# Patient Record
Sex: Female | Born: 1975
Health system: Southern US, Community
[De-identification: ages and names within clinical notes are randomized; demographics above are authoritative.]

## PROBLEM LIST (undated history)

## (undated) DIAGNOSIS — I10 Essential (primary) hypertension: Secondary | ICD-10-CM

## (undated) DIAGNOSIS — R112 Nausea with vomiting, unspecified: Secondary | ICD-10-CM

## (undated) DIAGNOSIS — L0390 Cellulitis, unspecified: Secondary | ICD-10-CM

## (undated) DIAGNOSIS — F32A Depression, unspecified: Secondary | ICD-10-CM

## (undated) DIAGNOSIS — N63 Unspecified lump in unspecified breast: Secondary | ICD-10-CM

## (undated) DIAGNOSIS — Z9889 Other specified postprocedural states: Secondary | ICD-10-CM

## (undated) DIAGNOSIS — B9562 Methicillin resistant Staphylococcus aureus infection as the cause of diseases classified elsewhere: Secondary | ICD-10-CM

## (undated) DIAGNOSIS — F419 Anxiety disorder, unspecified: Secondary | ICD-10-CM

## (undated) DIAGNOSIS — F329 Major depressive disorder, single episode, unspecified: Secondary | ICD-10-CM

## (undated) DIAGNOSIS — M1990 Unspecified osteoarthritis, unspecified site: Secondary | ICD-10-CM

## (undated) HISTORY — PX: TUBAL LIGATION: SHX77

## (undated) HISTORY — DX: Methicillin resistant Staphylococcus aureus infection as the cause of diseases classified elsewhere: B95.62

## (undated) HISTORY — PX: CHOLECYSTECTOMY: SHX55

## (undated) HISTORY — DX: Anxiety disorder, unspecified: F41.9

## (undated) HISTORY — DX: Unspecified lump in unspecified breast: N63.0

## (undated) HISTORY — DX: Essential (primary) hypertension: I10

## (undated) HISTORY — DX: Cellulitis, unspecified: L03.90

## (undated) HISTORY — PX: TONSILLECTOMY: SUR1361

## (undated) HISTORY — DX: Depression, unspecified: F32.A

## (undated) HISTORY — DX: Major depressive disorder, single episode, unspecified: F32.9

---

## 1998-11-20 HISTORY — PX: DILATION AND CURETTAGE OF UTERUS: SHX78

## 1998-11-26 ENCOUNTER — Ambulatory Visit (HOSPITAL_COMMUNITY): Admission: AD | Admit: 1998-11-26 | Discharge: 1998-11-26 | Payer: Self-pay | Admitting: Obstetrics and Gynecology

## 1998-11-26 ENCOUNTER — Inpatient Hospital Stay (HOSPITAL_COMMUNITY): Admission: AD | Admit: 1998-11-26 | Discharge: 1998-11-26 | Payer: Self-pay | Admitting: Obstetrics and Gynecology

## 1998-11-26 ENCOUNTER — Encounter (INDEPENDENT_AMBULATORY_CARE_PROVIDER_SITE_OTHER): Payer: Self-pay | Admitting: *Deleted

## 2000-01-19 ENCOUNTER — Other Ambulatory Visit: Admission: RE | Admit: 2000-01-19 | Discharge: 2000-01-19 | Payer: Self-pay | Admitting: Obstetrics and Gynecology

## 2000-10-10 ENCOUNTER — Inpatient Hospital Stay (HOSPITAL_COMMUNITY): Admission: AD | Admit: 2000-10-10 | Discharge: 2000-10-10 | Payer: Self-pay | Admitting: Obstetrics and Gynecology

## 2000-10-23 ENCOUNTER — Inpatient Hospital Stay (HOSPITAL_COMMUNITY): Admission: AD | Admit: 2000-10-23 | Discharge: 2000-10-23 | Payer: Self-pay | Admitting: Obstetrics and Gynecology

## 2000-10-24 ENCOUNTER — Inpatient Hospital Stay (HOSPITAL_COMMUNITY): Admission: AD | Admit: 2000-10-24 | Discharge: 2000-10-27 | Payer: Self-pay | Admitting: Obstetrics and Gynecology

## 2000-10-24 ENCOUNTER — Encounter (INDEPENDENT_AMBULATORY_CARE_PROVIDER_SITE_OTHER): Payer: Self-pay | Admitting: Specialist

## 2000-12-03 ENCOUNTER — Other Ambulatory Visit: Admission: RE | Admit: 2000-12-03 | Discharge: 2000-12-03 | Payer: Self-pay | Admitting: Obstetrics and Gynecology

## 2002-12-11 ENCOUNTER — Inpatient Hospital Stay (HOSPITAL_COMMUNITY): Admission: EM | Admit: 2002-12-11 | Discharge: 2002-12-18 | Payer: Self-pay | Admitting: General Surgery

## 2003-01-10 ENCOUNTER — Inpatient Hospital Stay (HOSPITAL_COMMUNITY): Admission: EM | Admit: 2003-01-10 | Discharge: 2003-01-18 | Payer: Self-pay | Admitting: Emergency Medicine

## 2003-05-28 ENCOUNTER — Emergency Department (HOSPITAL_COMMUNITY): Admission: EM | Admit: 2003-05-28 | Discharge: 2003-05-28 | Payer: Self-pay | Admitting: Emergency Medicine

## 2003-06-02 ENCOUNTER — Inpatient Hospital Stay (HOSPITAL_COMMUNITY): Admission: AD | Admit: 2003-06-02 | Discharge: 2003-06-06 | Payer: Self-pay | Admitting: General Surgery

## 2003-06-20 DIAGNOSIS — B9562 Methicillin resistant Staphylococcus aureus infection as the cause of diseases classified elsewhere: Secondary | ICD-10-CM

## 2003-06-20 HISTORY — DX: Methicillin resistant Staphylococcus aureus infection as the cause of diseases classified elsewhere: B95.62

## 2003-06-26 ENCOUNTER — Inpatient Hospital Stay (HOSPITAL_COMMUNITY): Admission: EM | Admit: 2003-06-26 | Discharge: 2003-07-04 | Payer: Self-pay | Admitting: Emergency Medicine

## 2003-08-09 ENCOUNTER — Inpatient Hospital Stay (HOSPITAL_COMMUNITY): Admission: AD | Admit: 2003-08-09 | Discharge: 2003-08-13 | Payer: Self-pay | Admitting: Family Medicine

## 2003-09-13 ENCOUNTER — Encounter: Admission: RE | Admit: 2003-09-13 | Discharge: 2003-09-13 | Payer: Self-pay | Admitting: Internal Medicine

## 2004-01-27 ENCOUNTER — Emergency Department (HOSPITAL_COMMUNITY): Admission: EM | Admit: 2004-01-27 | Discharge: 2004-01-27 | Payer: Self-pay | Admitting: Emergency Medicine

## 2005-04-05 ENCOUNTER — Ambulatory Visit: Payer: Self-pay | Admitting: Orthopedic Surgery

## 2005-12-18 ENCOUNTER — Encounter: Payer: Self-pay | Admitting: Orthopedic Surgery

## 2008-06-19 HISTORY — PX: ABLATION: SHX5711

## 2008-07-02 ENCOUNTER — Ambulatory Visit (HOSPITAL_COMMUNITY): Admission: RE | Admit: 2008-07-02 | Discharge: 2008-07-02 | Payer: Self-pay | Admitting: Obstetrics and Gynecology

## 2008-07-09 ENCOUNTER — Ambulatory Visit (HOSPITAL_COMMUNITY): Admission: RE | Admit: 2008-07-09 | Discharge: 2008-07-09 | Payer: Self-pay | Admitting: Obstetrics and Gynecology

## 2008-07-21 ENCOUNTER — Ambulatory Visit: Payer: Self-pay | Admitting: Orthopedic Surgery

## 2008-07-21 DIAGNOSIS — S86819A Strain of other muscle(s) and tendon(s) at lower leg level, unspecified leg, initial encounter: Secondary | ICD-10-CM

## 2008-07-21 DIAGNOSIS — S838X9A Sprain of other specified parts of unspecified knee, initial encounter: Secondary | ICD-10-CM | POA: Insufficient documentation

## 2008-07-22 ENCOUNTER — Encounter: Payer: Self-pay | Admitting: Orthopedic Surgery

## 2010-05-30 LAB — CBC
HCT: 38.4 % (ref 36.0–46.0)
Hemoglobin: 14.1 g/dL (ref 12.0–15.0)
MCHC: 36.6 g/dL — ABNORMAL HIGH (ref 30.0–36.0)
MCV: 91.7 fL (ref 78.0–100.0)
Platelets: 228 10*3/uL (ref 150–400)
RBC: 4.19 MIL/uL (ref 3.87–5.11)
RDW: 13 % (ref 11.5–15.5)
WBC: 7.9 10*3/uL (ref 4.0–10.5)

## 2010-05-30 LAB — HCG, QUANTITATIVE, PREGNANCY: hCG, Beta Chain, Quant, S: <2 m[IU]/mL (ref <5)

## 2010-07-04 NOTE — H&P (Signed)
NAME:  Carol Sharp, Carol Sharp               ACCOUNT NO.:  [id number]   MEDICAL RECORD NO.:  [id number]          PATIENT TYPE:  AMB   LOCATION:  DAY                           FACILITY:  [facility]   PHYSICIAN:  Tilda Burrow, M.D. DATE OF BIRTH:  12-26-1975   DATE OF ADMISSION:  DATE OF DISCHARGE:  LH                              HISTORY & PHYSICAL   ADMITTING DIAGNOSIS:  Menorrhagia.   HISTORY OF PRESENT ILLNESS:  This 35 year old female status post tubal  ligation in 2002, LMP Jul 06, 2008, was admitted at this time for  hysteroscopy, D&C, endometrial ablation.  Zera has been followed  through our office intermittently over the past few years.  She presents  with complaints of unacceptably heavy periods with 7 days of flow most  months, using 32 overnight pads per cycle with this flow interfering  with ability to work normally.  However, she has had accidents at work,  saturating clothing and office chair.  She has had an ultrasound which  shows the uterus is grossly normal, heterogenous in texture to the  myometrium with uterine measurements 8.3 x 5.0 x 5.7 cm with a normal  secretory endometrium at the time of the ultrasound.  This was Jul 02, 2008.  She subsequently began her normal menses.  Ovaries were grossly  normal and no suspected abnormalities.  Preoperative endometrial  biopsies not performed due to her young age and normalcy of ultrasound.  GC and Chlamydia cultures are negative.  Most recent Pap smear was class  I and that was greater than one year ago.   PAST MEDICAL HISTORY:  Benign.   MEDICATIONS:  None.   HABITS:  Cigarette one-half pack a day.  Alcohol, denied.  Recreational  drugs, denied.   HOSPITALIZATIONS:  None, other than surgical hospitalizations for  laparoscopic cholecystectomy, outpatient incision and drainage of the  leg boil, and tubal ligation in 2002.  She has had vaginal deliveries  x2, most recently delivered in 2001.   PHYSICAL EXAMINATION:   GENERAL:  A healthy Caucasian female, alert,  oriented x3.  HEENT:  Pupils equal, round, and reactive.  NECK:  Supple.  CARDIOVASCULAR:  Unremarkable.  ABDOMEN:  Moderate obesity without masses.  VITAL SIGNS:  Weight 239.6 kg, blood pressure 120/84.  GU:  External genitalia normal female.  Vaginal exam nonpurulent  menstrual flow.  Cervix multiparous; uterus antiflexed, nontender;  adnexa negative.   PLAN:  Hysteroscopy, D&C, endometrial ablation on Jul 08, 2008 at 7:30  a.m.      Tilda Burrow, M.D.  Electronically Signed     JVF/MEDQ  D:  07/07/2008  T:  07/08/2008  Job:  [id number]   cc:   [facility]

## 2010-07-04 NOTE — Op Note (Signed)
Carol Sharp, Carol Sharp               ACCOUNT NO.:  [id number]   MEDICAL RECORD NO.:  [id number]          PATIENT TYPE:  AMB   LOCATION:  DAY                           FACILITY:  [facility]   PHYSICIAN:  Tilda Burrow, M.D. DATE OF BIRTH:  11-06-1975   DATE OF PROCEDURE:  07/09/2008  DATE OF DISCHARGE:                               OPERATIVE REPORT   PREOPERATIVE DIAGNOSIS:  Menorrhagia.   POSTOPERATIVE DIAGNOSIS:  Menorrhagia.   PROCEDURE:  Hysteroscopy, dilation and curettage, endometrial ablation.   SURGEON:  Tilda Burrow, MD   ASSISTANT:  Janan Ridge, CST   ANESTHESIA:  General by Minerva Areola, CRNA.   COMPLICATIONS:  None.   FINDINGS:  Retroverted, retroflexed uterus sounding to 9 cm thin  endometrium.   INDICATIONS:  A 36 year old female with heavy amounts of menstrual flow,  unacceptable severity and duration.   DETAILS OF PROCEDURE:  The patient was taken to the operating room,  prepped and draped for a combined vaginal procedure.  The patient was  placed in low lithotomy support of the legs prior to anesthesia  induction.  Time-out was conducted and confirmed by all parties, IV  antibiotics administered perioperatively.  Speculum was inserted, a  single-tooth tenaculum used to grasp the cervix, paracervical block  using Marcaine x18 mL injected in the circumferential fashion from 3 to  9 o'clock.  The bladder did not require catheterization.  The uterus  sounded, slightly retroverted, retroflexed position to 9 cm, it was  dilated easily with 23-French, allowing introduction of the rigid 30-  degree hysteroscope revealing a thin endometrium with some fibrinous  debris from her recent menses.  Brief curettage was performed and  minimal tissue fragments obtained.  Repeat hysteroscopy confirmed  satisfactory debris removal.   Gynecare ThermaChoice III endometrial ablation balloon was tested,  inserted, inflated to 17 mL of D5W, with 8-minute thermal ablation  sequence conducted.  The 17 mL were recovered.  Repeat hysteroscopy  confirmed satisfactory thermal changes throughout the endometrial cavity  and were confirmed with photo documentation.   The patient went to recovery room in stable condition without difficulty  after reversal anesthesia.      Tilda Burrow, M.D.  Electronically Signed     JVF/MEDQ  D:  07/09/2008  T:  07/09/2008  Job:  [id number]   cc:   [facility]   Scott A. Gerda Diss, MD  Fax: [phone]

## 2010-07-07 NOTE — Discharge Summary (Signed)
NAME:  Carol Sharp, Carol Sharp                         ACCOUNT NO.:  [id number]   MEDICAL RECORD NO.:  [id number]                   PATIENT TYPE:  INP   LOCATION:  A331                                 FACILITY:  [facility]   PHYSICIAN:  Scott A. Gerda Diss, M.D.               DATE OF BIRTH:  [birth year]   DATE OF ADMISSION:  06/26/2003  DATE OF DISCHARGE:  07/04/2003                                 DISCHARGE SUMMARY   DISCHARGE DIAGNOSES:  Cellulitis with methicillin-resistant staphylococcus  aureus and small abscess to the truncal region.   HOSPITAL COURSE:  She was fairly toxic when she came in with nausea,  vomiting, fever, chills.  She was treated initially with regular antibiotics  such as Unasyn, but after a couple of days, it was apparent that she was not  improving,  and she was switched over to doxycycline along with gentamicin.  Her cultures did show MRSA.  The patient had a very slow resolution of this  problem, and the skin just gradually got better, going down to only 1.5 cm  in width.  She did have a CT scan to rule out an abscess that showed a deep  involvement of the skin, but no abscess.  She was felt stable for home, and  she was sent home on the 15th.   DISCHARGE MEDICATIONS:  1. Doxycycline 100 mg, one b.i.d. for three weeks.  2. Tylox q.4h. as needed for pain.   The main reason she stayed in the hospital so long was because her  cellulitis did not respond well to antibiotics, and it was very slow going  from an area of 8 cm down to 1 to 2 cm.  Also, too, the patient was having  excruciating pain for which she required IV antibiotics to help control the  pain.  It was not felt that this could be done anything home.     ___________________________________________                                         Jonna Coup. Gerda Diss, M.D.   Linus Orn  D:  07/23/2003  T:  07/23/2003  Job:  [id number]

## 2010-07-07 NOTE — Discharge Summary (Signed)
[facility]  Patient:    Carol Sharp, Carol Sharp Visit Number: [id number] MRN: [id number]          Service Type: OBS Location: 910A [id number] Attending Physician:  Trevor Iha Dictated by:   Danie Chandler, R.N. Admit Date:  10/24/2000 Discharge Date: 10/27/2000                             Discharge Summary  ADMISSION DIAGNOSES:          1. Intrauterine pregnancy at 37-1/[redacted] weeks                                  gestation.                               2. Preeclampsia.                               3. Desires sterility.                               4. Previous traumatic delivery.                               5. Primary cesarean section.  DISCHARGE DIAGNOSES:          1. Intrauterine pregnancy at 37-1/[redacted] weeks                                  gestation.                               2. Preeclampsia.                               3. Desires sterility.                               4. Previous traumatic delivery.                               5. Primary cesarean section.  PROCEDURES:                   1. On October 24, 2000, primary low transverse                                  cesarean section.                               2. ______ bilateral tubal ligation.  REASON FOR ADMISSION:         Please see H&P.  HOSPITAL COURSE:              The patient was taken to the operating room and underwent the above-named procedure without complications.  This  was productive of a viable female infant with Apgars of 8 at one minute and 9 at five minutes.  Postoperatively on day #1, the patient had good control of pain and a good return of bowel function.  Her hemoglobin was stable at 9.1, hematocrit 25.8, and white blood cell count 8.9.  She was started on iron daily and had a repeat CBC ordered on the following day.  On postoperative day #2 she was she was ambulating well without difficulty and tolerating a regular diet.  Her hemoglobin was stable at 9.3.   On postoperative day #3 she was discharged home.  CONDITION ON DISCHARGE:       Good.  DIET:                         Regular as tolerated.  ACTIVITY:                     No heavy lifting, no driving, no vaginal entry.  FOLLOW-UP:                    She is to follow up in the office in one to two weeks for incision check.  DISCHARGE INSTRUCTIONS:       She is to call for temperature greater than 100 degrees, persistent nausea or vomiting, heavy vaginal bleeding, and/or redness or drainage from the incision site.  DISCHARGE MEDICATIONS:        1. Prenatal vitamin 1 p.o. q.d.                               2. Percocet 5 mg as directed by M.D.                               3. Ibuprofen as directed by M.D. Dictated by:   Danie Chandler, R.N. Attending Physician:  Trevor Iha DD:  11/14/00 TD:  11/14/00 Job: [id number]

## 2010-07-07 NOTE — Discharge Summary (Signed)
NAME:  Carol Sharp, Carol Sharp                         ACCOUNT NO.:  [id number]   MEDICAL RECORD NO.:  [id number]                   PATIENT TYPE:  INP   LOCATION:  A339                                 FACILITY:  [facility]   PHYSICIAN:  Jerolyn Shin C. [name], M.D.                DATE OF BIRTH:  [birth year]   DATE OF ADMISSION:  01/10/2003  DATE OF DISCHARGE:  01/18/2003                                 DISCHARGE SUMMARY   DISCHARGE DIAGNOSES:  1. Subclinical infection, right medial thigh with lymphadenitis.  2. Depression.   DISPOSITION:  The patient is discharged home in satisfactory condition.   FOLLOWUP:  She will have followup in the office in two weeks.   DISCHARGE MEDICATIONS:  1. Cleocin 300 mg q.i.d.  2. Doxycycline 100 mg b.i.d.  3. Tylox two q.4h. p.r.n.  4. Effexor 150 mg daily.  5. Xanax 1 mg q.h.s.  6. __________  100 mg daily.   SUMMARY:  A 35 year old female who is status post excision of a large  abscess of the right medial thigh on December 15, 2002.  She was treated with  antibiotics and discharged on December 18, 2002.  She did well.  Over the  past two weeks, she had increasing swelling of the primary site with  cellulitis surrounding the area.  She is readmitted for IV antibiotics.  White count is only 11,000.  The patient was treated with IV Zosyn and  Cleocin.  Local wound care was carried out, and the wound was covered to  keep the patient from picking at the wound.  She responded appropriately.  The white count dropped to normal.   IMPRESSION:  1. Induration of the medial thigh, resolved.  2. Surrounding cellulitis, resolved.   By the time of discharge, the inflammatory mass in her inguinal area had  significantly improved.  The patient developed vaginitis due to the  antibiotics and was started on Monistat cream and Diflucan.  She was  discharged on November 29 in satisfactory condition.     ___________________________________________        Dirk Dress [name], M.D.   LCS/MEDQ  D:  02/21/2003  T:  02/21/2003  Job:  [id number]

## 2010-07-07 NOTE — H&P (Signed)
NAME:  Carol Sharp, Carol Sharp                         ACCOUNT NO.:  [id number]   MEDICAL RECORD NO.:  [id number]                   PATIENT TYPE:  INP   LOCATION:  A339                                 FACILITY:  [facility]   PHYSICIAN:  Jerolyn Shin C. [name], M.D.                DATE OF BIRTH:  03-Jun-1975   DATE OF ADMISSION:  01/10/2003  DATE OF DISCHARGE:  01/18/2003                                HISTORY & PHYSICAL   HISTORY:  This is a 35 year old female, status post wide excision of an  inflammatory mass of her right medial thigh.  The mass was previously  excised.  She was discharged home on oral antibiotics with the wound  healing.  She presents with a cellulitis.  There is also an enlarging mass  of her right groin which appears to be a lymph node.  The previous surgery  was October 26, and she was discharged on December 18, 2002.   PAST HISTORY:  She has no other major illness except for mild depression.   MEDICATIONS:  1. Effexor XR 150 mg daily.  2. Xanax 1 mg q.h.s.  3. Discharge antibiotics were Levaquin 500 mg daily and Cleocin 300 mg q.6h.     She states that she has been taking these medications.   PHYSICAL EXAMINATION:  GENERAL:  She is not in any acute distress.  VITAL SIGNS:  Blood pressure 100/80, pulse 80, respirations 20, temperature  97.6.  HEENT:  Unremarkable.  NECK:  Supple.  No JVD or bruit.  CHEST:  Clear to auscultation.  HEART:  Regular rate and rhythm.  Without murmur, gallop, or rub.  ABDOMEN:  Soft and nontender.  No masses.  Normal bowel sounds.  EXTREMITIES:  Healing incision right medial thigh in the upper aspect below  the femoral triangle.  There is also a tender mass in the right groin which  appears to be nodular and soft.  There is some mild cellulitis around the  knee wound.  There is no edema.  NEUROLOGIC:  Nonfocal.   IMPRESSION:  1. Recurrent cellulitis, status post excision of inflammatory mass right     thigh.  2. Mild depression.   PLAN:  Admit  for IV antibiotics, and re-excise if needed.     ___________________________________________                                         Dirk Dress [name], M.D.   LCS/MEDQ  D:  02/21/2003  T:  02/22/2003  Job:  [id number]

## 2010-07-07 NOTE — H&P (Signed)
NAME:  Carol Sharp, Carol Sharp                         ACCOUNT NO.:  [id number]   MEDICAL RECORD NO.:  [id number]                   PATIENT TYPE:  INP   LOCATION:  A311                                 FACILITY:  [facility]   PHYSICIAN:  Scott A. Gerda Diss, M.D.               DATE OF BIRTH:  02/09/1976   DATE OF ADMISSION:  08/09/2003  DATE OF DISCHARGE:                                HISTORY & PHYSICAL   CHIEF COMPLAINT:  Painful lesions on the skin.   HISTORY OF PRESENT ILLNESS:  This is a 35 year old obese white female who  presents with several areas of cellulitis on the leg and on the left  buttock.  The area on the left buttock measures 3 inches by 3 inches.  This  does not have any fluctuance with it.  One area did have a follicle with a  small amount of pus.  These started occurring over the past 36 hours.  The  patient was recently in the hospital back in early May with MRSA cellulitis  and was treated for several days in the hospital along with outpatient  several weeks of doxycycline.  She had just been off of doxycycline about  two weeks when this occurred.  She has been using Bactroban in her nasal  passages along with the husband as well, but it still reoccurred.  The  patient denies any high fever.  She states severe pain; unable to tolerate  oral HYDROCODONE, because of allergy.   PAST MEDICAL HISTORY:  1. MRSA cellulitis in May 2005.  2. Inguinal abscess October 2004.   SOCIAL HISTORY:  Works.  She does not smoke.  She is married and has  children.   FAMILY HISTORY:  Hypertension, diabetes, heart disease, cholesterol.   ALLERGIES:  CODEINE.   CURRENT MEDICATIONS:  Effexor 75 mg q. day.   REVIEW OF SYSTEMS:  Per above.   PHYSICAL EXAMINATION:  HEENT:  NAD.  No lesions noted.  TMs/NLT.  NECK:  No masses.  CHEST:  CTA.  No crackles.  HEART:  Regular.  No murmurs.  Pulses are normal.  Blood pressure is  slightly elevated.  ABDOMEN:  Soft.  EXTREMITIES:  No edema.  The patient  has several areas of cellulitis; two on  the back of the right leg plus one large one on the left buttock area.   A/B:  Abscess/minimal with significant cellulitis, suspicious for  methicillin-resistant Staphylococcus aureus.  Because of the severity of her  pain and her inability to take oral pain medication because of allergies, I  feel that it is  best to admit the patient in with vancomycin along with doxycycline plus  also to culture one of the wounds and check blood work as well.  I expect  the patient to be in the hospital the next several days in order to get a  significant beat on this.  Follow-up accordingly on a  daily basis.     ___________________________________________                                         Jonna Coup Gerda Diss, M.D.   Linus Orn  D:  08/09/2003  T:  08/09/2003  Job:  [id number]

## 2010-07-07 NOTE — H&P (Signed)
NAME:  Carol Sharp, Carol Sharp                         ACCOUNT NO.:  [id number]   MEDICAL RECORD NO.:  [id number]                   PATIENT TYPE:  INP   LOCATION:  A331                                 FACILITY:  [facility]   PHYSICIAN:  Francoise Schaumann. Halm, D.O.                DATE OF BIRTH:  Mar 05, 1975   DATE OF ADMISSION:  06/26/2003  DATE OF DISCHARGE:                                HISTORY & PHYSICAL   CHIEF COMPLAINT:  Painful toe and painful rash.   BRIEF HISTORY:  The patient is a 35 year old female who presents to the ED  with a one to two day history of progressive red rash on her abdomen with  associated pain.  The patient had a partial toenail removal a number of days  ago in her primary care doctor's office due to an ingrown toenail.  She was  also placed apparently on Lamisil for suspected tinea.  Within the last 36  to 48 hours, she has progressed to develop a very painful rash over her  lower mid abdominal area, generalized malaise and increased toe pain.  In  the emergency room, the ED doctor noted that she had apparent cellulitis of  her abdominal wall as well as some mild inflammation of her toe.  She is  admitted to the hospital for intravenous antibiotics and pain management.   PAST MEDICAL HISTORY:  1. The patient has had a cholecystectomy.  2. She takes medication for depression and anxiety.  3. She says that her general health has been quite good.  4. She has had two children.   ALLERGIES:  SULFA, unclear reaction.   MEDICATIONS:  1. Effexor XR, 150 mg p.o. daily.  2. Alprazolam 1.0 mg p.o. b.i.d.   SOCIAL HISTORY:  The patient is married with two children.  She denies any  excess alcohol intake, illegal drug use or significant tobacco use.   REVIEW OF SYSTEMS:  The patient denies any frank fevers or rigors.  She has  had no other rash other than that noted on her abdomen.  She has had no  joint pains.  No URI symptoms.   PHYSICAL EXAMINATION:  VITAL SIGNS:  Charted and  show her temperature to be  97.6, pulse 109, respirations 22, blood pressure 159/98.  GENERAL:  The patient is obese and in moderate distress due to pain.  She  appears to be somewhat uncomfortable.  HEENT:  Her head and neck evaluation is significant for prominent hirsutism  of her lower jaw with associated follicular papules.  She has central  obesity.  She has no significant adenopathy.  HEART:  Regular with no murmur.  LUNGS:  Clear.  ABDOMEN:  Tender mainly superficially over the erythematous patch which  measures approximately 15 cm and is oval shaped.  I marked out this area on  her abdomen with ink.  She has some striae from her obesity and/or  pregnancy.  She has no skin openings, sores or lesions noted over her  abdominal wall.  She has no flank pain.  GYN/BREASTS:  Exams were deferred at this time.  EXTREMITIES:  Show no edema, with good tone.  She has dry, scaly skin  between her toes on both sides with somewhat thickened toenails.  Her right  big toe is minimally erythematous with what appears to be some granulation  tissue forming at the base of the medial section of the toenail which has  been partially removed.  There is no bleeding or frank streaking of her leg  or foot.  Her toe is mobile and does not appear to be significantly tender,  although she does complain, it hurts when I move it.   IMPRESSION/PLAN:  1. Obese young female with apparent cellulitis of unclear origin.  We will     treat her for both her abdominal wall cellulitis as well as possible     infection in her toenail, although this appears to be healing okay .  2. History of anxiety and depression.  3. Risk for diabetes given her obese status and presentation of this     infection.  4. Likely polycystic ovarian syndrome, given her appearance, and facial     hair.   PLAN:  1. Admit the patient to the hospital for her acute problem which is the     cellulitis process.  She will be placed on Unasyn 3  grams initially and     then 1.5 grams IV q.8h.  2. We will follow progression of her cellulitis on a daily basis.  3. I will also check fasting glucoses on her as well as hemoglobin A1c's as     she may have a glucose intolerance.  4. The overall care plan has been reviewed with the patient, and she is in     agreement.  I will also provide her with liberal pain control given her     discomfort level.     ___________________________________________                                         Francoise Schaumann. Milford Cage, D.O.   [facility]/MEDQ  D:  06/26/2003  T:  06/26/2003  Job:  [id number]   cc:   Lorin Picket A. Gerda Diss, M.D.  [address]  [place]  [place] [address]  Fax: [phone]

## 2010-07-07 NOTE — Discharge Summary (Signed)
NAME:  Carol Sharp, Carol Sharp                         ACCOUNT NO.:  [id number]   MEDICAL RECORD NO.:  [id number]                   PATIENT TYPE:  INP   LOCATION:  A311                                 FACILITY:  [facility]   PHYSICIAN:  Jerolyn Shin C. [name], M.D.                DATE OF BIRTH:  [birth year]   DATE OF ADMISSION:  12/11/2002  DATE OF DISCHARGE:  12/18/2002                                 DISCHARGE SUMMARY   DISCHARGE DIAGNOSES:  1. Chronic subcutaneous abscess right groin.  2. Anxiety with depression.   SPECIAL PROCEDURE:  Wide excision with primary closure of large inflammatory  mass right groin December 15, 2002.   DISPOSITION:  Patient is discharged home in stable satisfactory condition.   DISCHARGE MEDICATIONS:  1. Ambien 10 mg q.h.s. p.r.n.  2. Levaquin 500 mg every day x10 days.  3. Tylox two q.4 hours p.r.n. pain.  4. Cleocin 300 mg q.6 hours x10 days.   She will be seen in our office one week post discharge and she will see her  Primary Physician, Dr. Simone Curia, on a p.r.n. basis.   SUMMARY:  This is a 35 year old female admitted with abscess and probable  infectious lymphadenitis right leg.  The patient states that she initially  had an area of swelling and redness on the medial aspect of her thigh  immediately above the knee.  She was seen by Dr. Simone Curia.  He did an  I&D, placed a small drain, and started her on antibiotics.  The patient  states that the drain became dislodged.  She returned to the office 48 hours  later with more swelling around the area of the previous excision and  swelling and cellulitis of the medial thigh and up the groin.  It was felt  that she had a small abscess in the lower medial aspect of the leg that  appeared to be adequately drained but that she had lymphadenitis of the  upper thigh.  She was admitted for IV antibiotics and it was felt that if  she did not respond that the area would have to be widely excised.  White  count on  admission was 14,400 though her sed rate was only 14.  On exam she  had a 3.5 cm inflamed, raised area of the lower thigh immediately above the  knee on the medial aspect.  There was an area of cellulitis extending from  the central point about 3 cm.  There was also an area about 5 cm transverse  diameter along her medial groin with cellulitis that extended about 10 cm  circumferentially from the mass.  This area was immediately below the  inguinal crease.  She had inguinal adenopathy in the femoral triangle.  Patient was admitted and started on IV antibiotics.  These consisted of  Rocephin and Levaquin.  She was given analgesics and her baseline  medications for depression.  The patient had some slight improvement in the  region of the lower aspect of her leg but the mass in the right upper  continued to enlarge and it became more solid.  It was felt that this would  probably have to be widely excised.  It was discussed with the patient and  she agreed.  On December 15, 2002, wide excision of the large inflammatory  mass in the right groin was carried out.  I was able to stay away from any  inflamed tissue so as to get good excision.  The skin that was involved in  the excision had a margin without inflammation.  The wound was therefore  closed primarily.  She did very well thereafter.  She stayed  afebrile.  The wound healed uneventfully.  There was no evidence of  infection.  She was subsequently discharged home on December 18, 2002, in  satisfactory condition.  Final pathology, which has returned on the chart,  is read as a subcutaneous abscess, etiology unknown.     ___________________________________________                                         Dirk Dress. [name], M.D.   LCS/MEDQ  D:  01/02/2003  T:  01/02/2003  Job:  [id number]   cc:   Donna Bernard, M.D.  [address]  [place]  [place] [address]  Fax: [phone]

## 2010-07-07 NOTE — H&P (Signed)
NAME:  Carol Sharp, Carol Sharp                         ACCOUNT NO.:  [id number]   MEDICAL RECORD NO.:  [id number]                   PATIENT TYPE:  INP   LOCATION:  A304                                 FACILITY:  [facility]   PHYSICIAN:  Jerolyn Shin C. [name], M.D.                DATE OF BIRTH:  01-21-1976   DATE OF ADMISSION:  06/02/2003  DATE OF DISCHARGE:                                HISTORY & PHYSICAL   HISTORY OF PRESENT ILLNESS:  A 35 year old female admitted for evaluation of  severe abdominal pain with nausea, and for perineal abscess.  The patient  gives a history of having acute onset of right-sided abdominal pain on the  day of admission.  The pain radiated to her neck and though to her back on  the right side.  She had nausea but no vomiting.  She had what she describes  as hot and cold spells.  She was seen in the office in severe distress.  She  was given IM injections of Nubain and Phenergan without improvement.  The  patient is therefore admitted to the hospital.  She gives a history of  having abscess of her left groin and right labia that were I&D'd in the  emergency room on May 28, 2003.  She was treated with Augmentin for a  period of 10 days.  She states that these persist.  I could not adequately  evaluate her in the office because of her writhing around in pain, but on  evaluation in the hospital, the areas of concern are drying up, and will not  be of any significance on this hospitalization.  She will be continued on  her Augmentin after she leaves the hospital.   PAST HISTORY:  1. She had an I&D of an abscess of her right thigh in January of 2005.  Wide     excision was done.  2. She has a history of depression.  3. Osteoarthritis.   MEDICATIONS:  1. Xanax 1 mg q.h.s.  2. Effexor 150 mg q.h.s.  3. Augmentin 1000 mg daily.   SOCIAL HISTORY:  She is married.  She is employed.  She smokes, but does not  drink or use drugs.   PHYSICAL EXAMINATION:  GENERAL:  The patient  appears to be in moderately  severe distress.  VITAL SIGNS:  Blood pressure 120/90, pulse 80, respirations 20, weight 264  pounds.  HEENT:  Unremarkable.  NECK:  Supple.  No JVD, bruit, adenopathy, or thyromegaly.  CHEST:  Clear to auscultation.  HEART:  Regular rate and rhythm without murmur, gallop, or rub.  ABDOMEN:  Mildly distended.  Normoactive bowel sounds.  Severe tenderness  with guarding in the epigastrium and right upper quadrant.  Moderate  tenderness in her right flank.  GENITOURINARY:  Perineum and perianal area reveals no acute inflammation.  There is some chronic scarring of her labia on the left, but no acute  inflammation or cellulitis.  EXTREMITIES:  No clubbing, cyanosis, or edema.  NEUROLOGIC:  No focal motor, sensory, or cerebellar deficits.   IMPRESSION:  1. Acute abdomen.  History and physical exam suggestive of biliary tract     disease.  2. History of recurrent folliculitis of the perineum, stable.  3. Acute anxiety disorder.  4. History of depression.   PLAN:  The patient is admitted.  She is started on IV antibiotics.  Will get  an emergency ultrasound.  She will receive IV analgesics, and further  treatment will be based on the results of her ultrasound study.     ___________________________________________                                         Dirk Dress [name], M.D.   LCS/MEDQ  D:  06/03/2003  T:  06/03/2003  Job:  [id number]

## 2010-07-07 NOTE — Discharge Summary (Signed)
NAME:  Carol Sharp, Carol Sharp                         ACCOUNT NO.:  [id number]   MEDICAL RECORD NO.:  [id number]                   PATIENT TYPE:  INP   LOCATION:  A311                                 FACILITY:  [facility]   PHYSICIAN:  Scott A. Gerda Diss, M.D.               DATE OF BIRTH:  Feb 18, 1976   DATE OF ADMISSION:  08/09/2003  DATE OF DISCHARGE:  08/13/2003                                 DISCHARGE SUMMARY   DISCHARGE DIAGNOSES:  Severe cellulitis of the leg and buttock related to  methicillin resistant staph aureus.   HISTORY OF PRESENT ILLNESS:  The patient had a previous methicillin  resistant staph aureus cellulitis in May of 2005. She had been using  Bactroban in nasal passages but presents today because of severe  inflammation, pain, and discomfort. No abscess is truly found but she does  have an area on the left buttock that is 4 x 3 inches and very thickened and  the area on the leg is 3 inches.   HOSPITAL COURSE:  She was treated with intravenous antibiotics and gradually  improved. The left buttock looked much better on the 24th. The area on the  leg had a small area that was firm but not fluctuant.   DISPOSITION:  It is felt that she was stable to be discharged to home on  Doxycycline along with Dilaudid tablets 2 mg 1 q. 6 hours as needed for  pain.   FOLLOW UP:  She is to followup next week with Dr. Simone Curia and  followup with [place] on a regular basis.     ___________________________________________                                         Jonna Coup Gerda Diss, M.D.   Linus Orn  D:  09/09/2003  T:  09/09/2003  Job:  [id number]   cc:   Donna Bernard, M.D.  [address]  [place]  [place] [address]  Fax: [phone]

## 2010-07-07 NOTE — Discharge Summary (Signed)
NAME:  Carol Sharp, Carol Sharp                         ACCOUNT NO.:  [id number]   MEDICAL RECORD NO.:  [id number]                   PATIENT TYPE:  INP   LOCATION:  A304                                 FACILITY:  [facility]   PHYSICIAN:  Jerolyn Shin C. [name], M.D.                DATE OF BIRTH:  20-Oct-1975   DATE OF ADMISSION:  06/02/2003  DATE OF DISCHARGE:  06/06/2003                                 DISCHARGE SUMMARY   DISCHARGE DIAGNOSES:  1. Chronic cholecystitis.  2. Recurrent perineal abscesses.  3. Depression.  4. Osteoarthritis.  5. Anxiety disorder.   SPECIAL PROCEDURE:  Laparoscopic cholecystectomy, April 16.   DISPOSITION:  The patient is discharged home without complaints and in  satisfactory condition.   DISCHARGE MEDICATIONS:  1. Tylox 1 q.6h. p.r.n. pain.  2. Effexor XR 150 mg q.h.s.  3. Xanax 1 mg q.h.s.   FOLLOWUP:  The patient is scheduled to be seen in the office on April 28.   SUMMARY:  A 35 year old female admitted for evaluation of severe abdomen  with nausea and for perineal abscess.  She gives the history of having acute  onset of right-sided abdominal pain.  The pain radiated to her neck and to  her back on the right side.  She had nausea without vomiting.  She also  described hot and cold spells.  When seen in the office, she was in severe  distress.  She was given Nubain and Phenergan IM without improvement.  Because of this, she was admitted.  The patient also complains of persistent  abscesses of her left groin and right labia.  The patient was admitted and  started on IV antibiotics and was scheduled for an emergency ultrasound.  She was given IV analgesics.  The ultrasound was negative; however,  hepatobiliary scan showed an ejection fraction of 20% or less.  She remained  symptomatic and was counseled for a cholecystectomy.  This was done on April  16, laparoscopically.  She was asymptomatic post laparoscopy.  She remained  afebrile.  She had no difficulty with her  perineal inflammation and this was  essentially dried up by the time she was discharged.  The patient was  discharged home without complaints in satisfactory condition.     ___________________________________________                                         Dirk Dress [name], M.D.   LCS/MEDQ  D:  06/19/2003  T:  06/19/2003  Job:  [id number]

## 2010-07-07 NOTE — Op Note (Signed)
NAME:  Carol Sharp, Carol Sharp                         ACCOUNT NO.:  [id number]   MEDICAL RECORD NO.:  [id number]                   PATIENT TYPE:  INP   LOCATION:  A304                                 FACILITY:  [facility]   PHYSICIAN:  Jerolyn Shin C. [name], M.D.                DATE OF BIRTH:  08/04/1975   DATE OF PROCEDURE:  06/05/2003  DATE OF DISCHARGE:                                 OPERATIVE REPORT   PREOPERATIVE DIAGNOSIS:  Acalculous cholecystitis.   POSTOPERATIVE DIAGNOSIS:  Acalculous cholecystitis.   OPERATION/PROCEDURE:  Laparoscopic cholecystectomy.   SURGEON:  Dirk Dress. [name], M.D.   DESCRIPTION OF PROCEDURE:  Under general endotracheal anesthesia the  patient's abdomen was prepped and draped into a sterile field.  A  supraumbilical midline incision was made.  A Veress needle was inserted  uneventfully.  Abdomen was insufflated with 3 L of CO2.  Using a Visiport  guide a 10 mm port was placed without difficulty.  Laparoscope was placed  and gallbladder was visualized.  Under videoscopic guidance, a 10 mm port  and two 5 mm ports were placed without difficulty.  The gallbladder was  positioned by the first assistant.  Cystic duct was dissected down to its  junction with the common bile duct and was then clipped close to the  gallbladder using five clips.  The cystic duct was then divided.  The cystic  artery had two branches.  Each was clipped with three clips and divided.  Using electrocautery and the hook dissector, the gallbladder was separated  from the intrahepatic bed without difficulty.  Gallbladder was placed in an  EndoCatch device and retrieved.  Irrigation was carried out but the fluid  was entirely clear.  There was no bleeding from the bed during the procedure  and there was no bile leak.  CO2 was allowed to escape from the abdomen and  the ports were removed.  The incisions were closed using 0 Dexon on the  fascia of the larger incisions.  The skin was closed with staples.  Dressings were placed.  She was awakened from anesthesia uneventfully,  transferred to a bed and taken to the post anesthesia care unit for further  monitoring.      ___________________________________________                                            Dirk Dress. [name], M.D.   LCS/MEDQ  D:  06/05/2003  T:  06/05/2003  Job:  [id number]

## 2010-07-07 NOTE — Op Note (Signed)
   NAME:  Carol Sharp, Carol Sharp                         ACCOUNT NO.:  [id number]   MEDICAL RECORD NO.:  [id number]                   PATIENT TYPE:  INP   LOCATION:  A311                                 FACILITY:  [facility]   PHYSICIAN:  Jerolyn Shin C. [name], M.D.                DATE OF BIRTH:  18-May-1975   DATE OF PROCEDURE:  12/15/2002  DATE OF DISCHARGE:                                 OPERATIVE REPORT   PREOPERATIVE DIAGNOSIS:  Chronic inflammatory mass, right groin.   POSTOPERATIVE DIAGNOSIS:  Chronic inflammatory mass, right groin.   OPERATION/PROCEDURE:  Wide excision of large inflammatory mass, right groin.   SURGEON:  Dirk Dress. [name], M.D.   DESCRIPTION OF PROCEDURE:  Under general anesthesia, the right groin and  medial thigh were prepped and draped in the sterile field.  Wide elliptical  incision was made around the skin and inflamed mass near the inguinal  crease.  The incision was extended down to the muscle and fascia.  The mass  was separated from the muscle and fascia.  Central portion of the mass was  not entered so there was no contamination.  The deep tissues were closed  with 2-0 and 3-0 Biosyn.  Skin was closed with staples.  Dressing was  placed.  The patient was awakened from anesthesia, transferred to a bed and  taken to the post anesthesia care unit for monitoring.        ___________________________________________                                            Dirk Dress. [name], M.D.   LCS/MEDQ  D:  12/15/2002  T:  12/15/2002  Job:  [id number]

## 2010-07-07 NOTE — Op Note (Signed)
[facility]  Patient:    FAY, BAGG Visit Number: [id number] MRN: [id number]          Service Type: OBS Location: [facility] Attending Physician:  Rhina Brackett Admit Date:  10/23/2000 Discharge Date: 10/23/2000                             Operative Report  PREOPERATIVE DIAGNOSIS:       1. Intrauterine pregnancy at 37.5 weeks.                               2. Preeclampsia.                               3. Desires sterility.                               4. Previous traumatic delivery for primary                                  cesarean section.  POSTOPERATIVE DIAGNOSIS:      1. Intrauterine pregnancy at 37.5 weeks.                               2. Preeclampsia.                               3. Desires sterility.                               4. Previous traumatic delivery for primary                                  cesarean section.  OPERATION:                    1. Primary low cervical transverse cesarean                                  section.                               2. [place] bilateral tubal ligation.  SURGEON:                      Trevor Iha, M.D.  ASSISTANT:                    Freddy Finner, M.D.  ANESTHESIA:                   Spinal.  ESTIMATED BLOOD LOSS:         One-thousand cc.  INDICATIONS:                  Ms. Duey is a 35 year old G2, P1 at 37.5 weeks estimated gestational age, developing preeclampsia as evidenced by elevated blood pressures, proteinuria and  edema.  Had normal laboratory evaluation last night.  Because of previous traumatic vaginal delivery with a child with a broken clavicle she declines vaginal birth and requests primary cesarean section.  She furthermore adamantly desires sterilization, and planned tubal ligation.  Risks and benefits were discussed at length and informed consent was obtained.  Risks does include 5/1,000 failure with bilateral tubal ligation.  FINDINGS AT TIME  OF SURGERY:  A viable female infant.  Apgars and pH are currently pending.  DESCRIPTION OF PROCEDURE:     After adequate analgesia the patient was placed in the supine position, left lateral tilt.  She was sterilely prepped and draped.  The bladder was sterile draped with a Foley catheter.  A Pfannenstiel skin incision was made two fingerbreadths above the pubic symphysis and taken down sharply to the fascia.  This was incised transversely and extended superiorly and inferiorly off the bellies of the rectus muscle.  Rectus muscle was then entered sharply.  Peritoneum was entered sharply.  Bladder flap was created, elevating the uterine serosa in creating the bladder flap, and placing it behind the bladder blade.  A low ___________ incision was made down to the placenta at which time a large amount of bleeding was encountered.  Digitally the operators hands were placed through the lower portion of the placenta into the amniotic sac.  Amniotomy was performed.  Vacuum extractor was applied.  Unable to keep vacuum on with two attempts, but the third attempt with the vacuum extractor delivered the vertex atraumatically.  Nares and pharynx were suctioned.  Nuchal cord times one was reduced.  Infant was then delivered, cord clamped and the infant was handed to the pediatricians for resuscitation.  Cord blood was then obtained.  The placenta was extracted manually.  The uterus exteriorized, wiped clean with a dry lap.  The myotomy incision was closed in two layers; first with a running locking layer of 0 monocril, the second being the imbricating layer. A small area of bleeding encountered at the right angle of the incision was made hemostatic with 0-monocril figure-of-eight.  At this time attention was turned towards the fallopian tubes.  The left and right fallopian tubes were identified by the fimbriated ends.  Midportion of the tubes were grasped with Babcock clamps.  Avascular window was  noted and Bovie cautery was used to make a small window in the mesosalpinx.  Two sutures of 0-plain were passed through the mesosalpinx, doubly ligated and a central portion of the tube excised; approximately 1.5 cm in size.  Tubal ostia were made hemostatic.   This was done similarly on both sides.  Good hemostasis was achieved; and, the left and right tubes sent to pathology.  At this time reexamination of the myotomy incision revealed good hemostasis. The uterus was placed back into the peritoneal cavity and after a copious amount of irrigation, reexamination of the tubes and the incision revealed good hemostasis.  The peritoneum was then closed with 0-monocril.  Rectus muscle was plicated in the midline.  Irrigation was once again applied and after adequate hemostasis the fascia was closed in a single suture of #1 Panacryl in a running fashion.  Irrigation was applied and after adequate hemostasis the skin was stapled and Steri-Strips applied.  Patient tolerated the procedure well and was stable on transfer to the recovery room.  Sponge and instruments were normal times three.  Estimated blood loss was 1,000 cc. Attending Physician:  Rhina Brackett DD:  10/24/00 TD:  10/25/00 Job:  [id number]

## 2010-07-07 NOTE — H&P (Signed)
NAME:  Carol Sharp, Carol Sharp                         ACCOUNT NO.:  [id number]   MEDICAL RECORD NO.:  [id number]                   PATIENT TYPE:  INP   LOCATION:  A311                                 FACILITY:  [facility]   PHYSICIAN:  Jerolyn Shin C. [name], M.D.                DATE OF BIRTH:  [birth year]   DATE OF ADMISSION:  12/11/2002  DATE OF DISCHARGE:                                HISTORY & PHYSICAL   REASON FOR ADMISSION:  A 35 year old female admitted with abscess and  probable  infectious lymphadenitis, right leg.   The patient states that she initially had an area of swelling and redness on  the medial aspect of her thigh, immediately above the knee.  She was seen by  Dr. Gerda Diss and started on antibiotics.  I&D of this area was done, and a  small drain was placed.  The drain evidently was dislodged.  She returned to  the office about 48 hours later with more swelling around the area of the  previous excision and swelling and cellulitis of the medial thigh at the  level of the groin.  It was felt that she has small abscess on the lower  medial aspect of the leg that appears to be adequately drained, though she  has lymphadenitis of the upper thigh.  She is admitted for antibiotic  therapy.  If it does not localize, we will excise the area.  White count is  14,400, though her sed rate is only 14.   PAST MEDICAL HISTORY:  She has no major medical illness.  She does have mild  anxiety and depression.   MEDICATIONS:  1. Effexor XR 150 mg daily.  2. Xanax 1 mg q.h.s. p.r.n.   ALLERGIES:  no known drug allergies.   FAMILY HISTORY:  Positive for diabetes.   PAST SURGICAL HISTORY:  Her only surgeries are tubal ligation,  tonsillectomy, and C section.   PHYSICAL EXAMINATION:  GENERAL:  No acute distress.  VITAL SIGNS:  Blood pressure 140/90, pulse 120, respirations 20, temperature  98 degrees.  HEENT:  Unremarkable.  NECK:  Supple.  No JVD, bruits, lymphadenopathy, or thyromegaly.  CHEST:   Clear to auscultation.  No rales, rubs, rhonchi, or wheezes.  HEART:  Regular rate and rhythm.  Resting tachycardia.  Normal S1 and S2. No  murmur.  ABDOMEN:  Obese, soft, nontender.  No masses.  Normoactive bowel sounds.  No  suprapubic tenderness.  No inflammation of the lower abdomen.  EXTREMITIES:  There is 3.5 cm inflamed raised area of the lower thigh  immediately above the knee on the medial aspect.  There is an area of  cellulitis extending from this for about 2.5 to 3 cm.  There is no tender  venous cord, but in her groin there is an area of swelling that is about 5  cm and induration with an area of cellulitis that extended about 8  to 10 cm  circumferentially around the mass.  This is immediately below the inguinal  crease.  There is exquisite tenderness of the area.  She has no lesions on  her other leg.  She does have some inguinal adenopathy which is mildly  tender.  NEUROLOGIC:  No focal motor, sensory, or cerebellar deficit.   IMPRESSION:  Abscess and cellulitis with lymphadenitis of right medial  thigh.   PLAN:  The patient will be treated with antibiotics, and we will monitor  her.  If she does not defervesce, her white count does not come down, and  the redness does not decrease, she will be taken to the operating room, and  both areas will be widely excised with either delayed primary closure or  healing by secondary intention.  This has been discussed with patient, and  she agrees.     ___________________________________________                                         Dirk Dress [name], M.D.   LCS/MEDQ  D:  12/14/2002  T:  12/14/2002  Job:  [id number]   cc:   Donna Bernard, M.D.  [address]  [place]  [place] [address]  Fax: [phone]

## 2010-07-07 NOTE — H&P (Signed)
[facility]  Patient:    Carol Sharp, LANGHORNE Visit Number: [id number] MRN: [id number]          Service Type: OBS Location: [id number] Attending Physician:  Trevor Iha Dictated by:   Trevor Iha, M.D. Admit Date:  10/24/2000                           History and Physical  HISTORY OF PRESENT ILLNESS:   Ms. Carol Sharp is a 35 year old G3, P1, A1 presents for primary cesarean section.  Estimated date of confinement is November 10, 2000.  Her pregnancy has been complicated by hypertension, proteinuria.  Yesterday, her blood pressure was 130/92 with 1+ protein.  She is also group B Strep positive.  Her first pregnancy was complicated by traumatic delivery with the child having broken clavicle, subdural hematoma. Because of the traumatic delivery, the patient elects to not deliver vaginally but plans to proceed with primary cesarean section.  Because of worsening blood pressure and proteinuria, we plan to proceed with primary cesarean section today.  She otherwise has had recent headaches and some shortness of breath and 2+ edema.  PAST MEDICAL HISTORY:         Negative.  PAST SURGICAL HISTORY:        Tonsillectomy and D&E for a miscarriage.  PAST OBSTETRICAL HISTORY:     History of preeclampsia previous pregnancy and also traumatic delivery, as discussed above.  She has also had a miscarriage.  PAST GYNECOLOGICAL HISTORY:   No history of sexually-transmitted diseases. She did have an abnormal Pap smear in 1992 and had cryosurgery.  MEDICATIONS:                  Prenatal vitamins.  ALLERGIES:                    SULFA.  PHYSICAL EXAMINATION:  VITAL SIGNS:                  Blood pressure 130/92, weight 315 pounds.  HEART:                        Regular rate and rhythm.  LUNGS:                        Clear to auscultation bilaterally.  ABDOMEN:                      Gravid and obese.  PELVIC:                       Cervix is 2, 50%, -3  station.  EXTREMITIES:                  Reflexes are 2/4.  She has 2+ pitting edema.  No clonus.  IMPRESSION AND PLAN:          Intrauterine pregnancy at 37-1/2 weeks with preeclampsia and history of traumatic delivery.  Plan primary low transverse cesarean section.  Risks and benefits were discussed at length including but not limited to risk of infection; bleeding; damage to bowel, bladder, uterus, tubes, ovaries.  Both her and her husband are adamant they desire sterilization.  Risks and benefits associated with that were also discussed with the patient including risk of failure quoted at 5:1000 failure rate.  The patient does give her informed consent.  Dictated by:   Trevor Iha, M.D. Attending Physician:  Trevor Iha DD:  10/24/00 TD:  10/24/00 Job: [phone] [id number]

## 2011-06-17 ENCOUNTER — Encounter (HOSPITAL_COMMUNITY): Payer: Self-pay | Admitting: *Deleted

## 2011-06-17 ENCOUNTER — Emergency Department (HOSPITAL_COMMUNITY)
Admission: EM | Admit: 2011-06-17 | Discharge: 2011-06-17 | Disposition: A | Discharge status: Home / Self Care | Payer: No Typology Code available for payment source | Attending: Emergency Medicine | Admitting: Emergency Medicine

## 2011-06-17 ENCOUNTER — Emergency Department (HOSPITAL_COMMUNITY): Payer: No Typology Code available for payment source

## 2011-06-17 DIAGNOSIS — S161XXA Strain of muscle, fascia and tendon at neck level, initial encounter: Secondary | ICD-10-CM

## 2011-06-17 DIAGNOSIS — S40019A Contusion of unspecified shoulder, initial encounter: Secondary | ICD-10-CM | POA: Insufficient documentation

## 2011-06-17 DIAGNOSIS — M542 Cervicalgia: Secondary | ICD-10-CM | POA: Insufficient documentation

## 2011-06-17 DIAGNOSIS — M25519 Pain in unspecified shoulder: Secondary | ICD-10-CM | POA: Insufficient documentation

## 2011-06-17 DIAGNOSIS — Y92410 Unspecified street and highway as the place of occurrence of the external cause: Secondary | ICD-10-CM | POA: Insufficient documentation

## 2011-06-17 DIAGNOSIS — S40029A Contusion of unspecified upper arm, initial encounter: Secondary | ICD-10-CM | POA: Insufficient documentation

## 2011-06-17 DIAGNOSIS — S139XXA Sprain of joints and ligaments of unspecified parts of neck, initial encounter: Secondary | ICD-10-CM | POA: Insufficient documentation

## 2011-06-17 MED ORDER — HYDROCODONE-ACETAMINOPHEN 5-325 MG PO TABS
2.0000 | ORAL_TABLET | Freq: Once | ORAL | Status: AC
  Administered 2011-06-17: 2 via ORAL
  Filled 2011-06-17: qty 2

## 2011-06-17 MED ORDER — HYDROCODONE-ACETAMINOPHEN 5-325 MG PO TABS: 1.0000 | ORAL_TABLET | ORAL | Status: AC | PRN
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
## 2012-07-29 ENCOUNTER — Other Ambulatory Visit: Payer: Self-pay | Admitting: *Deleted

## 2012-07-29 MED ORDER — ALPRAZOLAM 1 MG PO TABS: ORAL_TABLET | ORAL | Status: DC

## 2012-08-30 ENCOUNTER — Encounter: Payer: Self-pay | Admitting: *Deleted

## 2012-09-05 ENCOUNTER — Encounter: Payer: Self-pay | Admitting: Nurse Practitioner

## 2012-09-05 ENCOUNTER — Ambulatory Visit (INDEPENDENT_AMBULATORY_CARE_PROVIDER_SITE_OTHER): Payer: 59 | Admitting: Nurse Practitioner

## 2012-09-05 VITALS — BP 146/98 | HR 90 | Wt 238.2 lb

## 2012-09-05 DIAGNOSIS — F4323 Adjustment disorder with mixed anxiety and depressed mood: Secondary | ICD-10-CM

## 2012-09-05 DIAGNOSIS — M62838 Other muscle spasm: Secondary | ICD-10-CM

## 2012-09-05 DIAGNOSIS — R030 Elevated blood-pressure reading, without diagnosis of hypertension: Secondary | ICD-10-CM

## 2012-09-05 DIAGNOSIS — IMO0001 Reserved for inherently not codable concepts without codable children: Secondary | ICD-10-CM

## 2012-09-05 MED ORDER — CLONAZEPAM 1 MG PO TABS: ORAL_TABLET | ORAL | Status: DC

## 2012-09-05 MED ORDER — DULOXETINE HCL 30 MG PO CPEP: 30.0000 mg | ORAL_CAPSULE | Freq: Every day | ORAL | Status: DC

## 2012-09-05 NOTE — Patient Instructions (Addendum)
Ice/heat applications Icy hot Steady Relief TENS unit

## 2012-09-08 ENCOUNTER — Encounter: Payer: Self-pay | Admitting: Nurse Practitioner

## 2012-09-08 DIAGNOSIS — F4323 Adjustment disorder with mixed anxiety and depressed mood: Secondary | ICD-10-CM | POA: Insufficient documentation

## 2012-09-08 NOTE — Progress Notes (Signed)
Subjective:  Presents for recheck. Concerned about elevated blood pressure. More stress lately. Has been is planning back surgery. Some financial issues. No longer exercising. Currently off her Adipex. Minimal relief of her anxiety with Xanax. Smoking half pack per day. No alcohol use. Denies suicidal or homicidal thoughts or ideation. No chest pain shortness of breath edema or palpitations. Some difficulty sleeping at times. Emotional lability. Tight tender muscles along the neck and upper back area with occasional dull headache.  Objective:   BP 146/98  Pulse 90  Wt 238 lb 3.2 oz (108.047 kg)  BMI 35.16 kg/m2 NAD. Alert, oriented. Mildly anxious affect. Lungs clear. Heart regular rate rhythm. No murmur or gallop noted. BP on recheck left arm sitting 146/98. Baseline BP according to previous records runs 112-118/86. Very tight tender muscles noted along the trapezius and neck area.  Assessment:Adjustment disorder with mixed anxiety and depressed mood  Elevated blood pressure  Muscle spasms of head and/or neck  Plan: Continue Cymbalta as directed. Switch to Klonopin 1 mg. Discussed importance of regular exercise and stress reduction. Recommend rechecking BP outside office and call back if it remains elevated. Discussed effects of stress on her BP. Ice/heat to the neck area. Gentle exercises. Massage therapy. OTC TENS unit. Recheck in 3 months, call back sooner if needed.

## 2012-09-08 NOTE — Assessment & Plan Note (Signed)
Plan: Continue Cymbalta as directed. Switch to Klonopin 1 mg. Discussed importance of regular exercise and stress reduction.

## 2012-09-09 ENCOUNTER — Telehealth: Payer: Self-pay | Admitting: Family Medicine

## 2012-09-09 NOTE — Telephone Encounter (Signed)
Last seen 09/05/12 for issues

## 2012-09-09 NOTE — Telephone Encounter (Signed)
Left message on voicemail to return call.

## 2012-09-09 NOTE — Telephone Encounter (Signed)
I am concerned about this patient's blood pressure. I do believe that the patient will need to be on medication for this. Is it possible at all for her to check her blood pressure currently? It is possible that the patient may need to combine have the nurse check the blood pressure consult with me then we can start medication. Then certainly we would have her followup in several weeks. Please talk with her. If she R. he has blood pressure readings from home since her office visit I may be able to initiate treatment then have her followup. Thank you

## 2012-09-09 NOTE — Telephone Encounter (Signed)
Pt still can't get rid of her Headaches, was given cymbalta and something for anxiety (pt had no idea what?) pt feels maybe her BP is causing this? Would like someone to call her and go over this with her.

## 2012-09-10 ENCOUNTER — Encounter: Payer: Self-pay | Admitting: Family Medicine

## 2012-09-10 ENCOUNTER — Ambulatory Visit: Payer: 59 | Admitting: *Deleted

## 2012-09-10 VITALS — BP 118/84

## 2012-09-10 DIAGNOSIS — IMO0001 Reserved for inherently not codable concepts without codable children: Secondary | ICD-10-CM

## 2012-09-10 NOTE — Telephone Encounter (Signed)
Patient was blood pressure was checked it looked good this time she will get a home machine check it several times a week followup within 2 weeks. When the saw patient this morning. Should be able to file this.

## 2012-09-10 NOTE — Telephone Encounter (Signed)
Patient coming by this morning to have blood pressure checked

## 2012-09-10 NOTE — Progress Notes (Signed)
  Subjective:    Patient ID: Carol Sharp, female    DOB: [birth year], 37 y.o.   MRN: [id number]  HPIPatient in for a nurse visit for a blood pressure check. BP 118/84 today. concult with Dr. Lorin Picket. Advised to get blood pressure machine with large cuff and check blood pressure 3 times a week for 2 weeks and to call back with blood pressure readings.     Review of Systems     Objective:   Physical Exam        Assessment & Plan:

## 2012-10-03 ENCOUNTER — Encounter: Payer: Self-pay | Admitting: Nurse Practitioner

## 2012-10-03 ENCOUNTER — Ambulatory Visit (INDEPENDENT_AMBULATORY_CARE_PROVIDER_SITE_OTHER): Payer: 59 | Admitting: Nurse Practitioner

## 2012-10-03 VITALS — BP 148/110 | Ht 69.0 in | Wt 242.4 lb

## 2012-10-03 DIAGNOSIS — R229 Localized swelling, mass and lump, unspecified: Secondary | ICD-10-CM

## 2012-10-03 DIAGNOSIS — S5000XA Contusion of unspecified elbow, initial encounter: Secondary | ICD-10-CM

## 2012-10-03 DIAGNOSIS — I10 Essential (primary) hypertension: Secondary | ICD-10-CM

## 2012-10-03 DIAGNOSIS — R2241 Localized swelling, mass and lump, right lower limb: Secondary | ICD-10-CM

## 2012-10-03 DIAGNOSIS — S5002XA Contusion of left elbow, initial encounter: Secondary | ICD-10-CM

## 2012-10-03 MED ORDER — CLONAZEPAM 1 MG PO TABS: ORAL_TABLET | ORAL | Status: DC

## 2012-10-03 MED ORDER — HYDROCHLOROTHIAZIDE 25 MG PO TABS: 25.0000 mg | ORAL_TABLET | Freq: Every day | ORAL | Status: DC

## 2012-10-03 MED ORDER — DULOXETINE HCL 30 MG PO CPEP: 30.0000 mg | ORAL_CAPSULE | Freq: Every day | ORAL | Status: DC

## 2012-10-06 ENCOUNTER — Encounter: Payer: Self-pay | Admitting: Nurse Practitioner

## 2012-10-06 DIAGNOSIS — I10 Essential (primary) hypertension: Secondary | ICD-10-CM | POA: Insufficient documentation

## 2012-10-06 NOTE — Assessment & Plan Note (Signed)
HCTZ 25 mg every morning. Discussed importance of stress reduction.
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
## 2012-11-27 ENCOUNTER — Encounter: Payer: Self-pay | Admitting: Family Medicine

## 2012-11-27 ENCOUNTER — Telehealth: Payer: Self-pay | Admitting: Family Medicine

## 2012-11-27 NOTE — Telephone Encounter (Signed)
Notified patient she can come by and pick up work excuse. She verbalized understanding.

## 2012-11-27 NOTE — Telephone Encounter (Signed)
Pt has stomach bug, wants to know if she can get a Work Excuse for 10/8, 10/09 & 10/10 to return on 10/13  Pt has zofran at home, so she feels like she is ok on meds

## 2012-11-27 NOTE — Telephone Encounter (Signed)
Note left for patient at front

## 2012-11-27 NOTE — Telephone Encounter (Signed)
Give WE, f-u if ongoing

## 2013-02-03 ENCOUNTER — Encounter: Payer: Self-pay | Admitting: Family Medicine

## 2013-02-03 ENCOUNTER — Ambulatory Visit (INDEPENDENT_AMBULATORY_CARE_PROVIDER_SITE_OTHER): Payer: 59 | Admitting: Family Medicine

## 2013-02-03 VITALS — BP 128/88 | Ht 69.0 in | Wt 254.0 lb

## 2013-02-03 DIAGNOSIS — R5381 Other malaise: Secondary | ICD-10-CM

## 2013-02-03 DIAGNOSIS — M79609 Pain in unspecified limb: Secondary | ICD-10-CM

## 2013-02-03 DIAGNOSIS — Z0000 Encounter for general adult medical examination without abnormal findings: Secondary | ICD-10-CM

## 2013-02-03 DIAGNOSIS — M79671 Pain in right foot: Secondary | ICD-10-CM

## 2013-02-03 DIAGNOSIS — F411 Generalized anxiety disorder: Secondary | ICD-10-CM

## 2013-02-03 MED ORDER — ALPRAZOLAM 1 MG PO TABS: 1.0000 mg | ORAL_TABLET | Freq: Every evening | ORAL | Status: DC | PRN

## 2013-02-03 MED ORDER — PHENTERMINE HCL 37.5 MG PO TABS: 37.5000 mg | ORAL_TABLET | Freq: Every day | ORAL | Status: DC

## 2013-02-03 MED ORDER — MELOXICAM 15 MG PO TABS: 15.0000 mg | ORAL_TABLET | Freq: Every day | ORAL | Status: DC

## 2013-02-03 MED ORDER — CITALOPRAM HYDROBROMIDE 10 MG PO TABS: 10.0000 mg | ORAL_TABLET | Freq: Every day | ORAL | Status: DC

## 2013-02-03 NOTE — Progress Notes (Signed)
   Subjective:    Patient ID: Carol Sharp, female    DOB: 10/06/1975, 37 y.o.   MRN: [id number]  HPI Patient is here today for a refill on meds.   She would like to switch from Klonopin to Xanax. She says the Klonopin does not help her sleep.  Stopped Cymbalta. No depression Sx. still has anxiety  She would also like for you to look at her right foot. She has a knot on the top of it that comes and goes. I do recommend that we set the patient up with podiatry.  Pt would also like to get back on her diet pill. We talked at length about proper eating habits and exercise.   PMH see previous notes  Review of Systems Denies being suicidal no chest tightness pressure pain shortness of breath.    Objective:   Physical Exam  Lungs are clear hearts regular pulse normal blood pressure good extremities no edema skin warm dry  Right foot ganglion cyst    Assessment & Plan:  #1 obesity-Adipex 37.5 mg one each morning #30 with 2 refills #2 anxiety related issues Celexa 10 mg daily if this interferes with sexual function she will call us we will try Wellbutrin #3 insomnia occasional use of Xanax 1 mg half a tablet or whole tablet as needed is helpful #4 obesity patient was encouraged to watch portions and exercise on a regular basis #5 right foot pain-on examination it appears that she has a ganglion cyst I recommend referral to Dr. Pricilla Holm

## 2013-02-10 ENCOUNTER — Telehealth: Payer: Self-pay | Admitting: Family Medicine

## 2013-02-10 ENCOUNTER — Other Ambulatory Visit: Payer: Self-pay | Admitting: Family Medicine

## 2013-02-10 MED ORDER — TRAMADOL HCL 50 MG PO TABS: 50.0000 mg | ORAL_TABLET | Freq: Four times a day (QID) | ORAL | Status: DC | PRN

## 2013-02-10 NOTE — Telephone Encounter (Signed)
Due to stated allergies tramadol best bet, #30, 1 q6 hours prn , stop if itching or allergy

## 2013-02-10 NOTE — Telephone Encounter (Signed)
Hydrocodone, #30, 1 q 6 prn caution drow, not for long term

## 2013-02-10 NOTE — Telephone Encounter (Signed)
Med sent in and patient notified.  

## 2013-02-10 NOTE — Telephone Encounter (Signed)
Pt seen 12/16 for foot pain, given anti inflammatory, wants something for pain. Can come pick it up today

## 2013-02-10 NOTE — Telephone Encounter (Signed)
Not many choices, NTC, can try hydrocodone for occasional use/home use

## 2013-02-22 ENCOUNTER — Other Ambulatory Visit: Payer: Self-pay | Admitting: Nurse Practitioner

## 2013-02-25 ENCOUNTER — Encounter: Payer: Self-pay | Admitting: Family Medicine

## 2013-03-13 ENCOUNTER — Telehealth: Payer: Self-pay | Admitting: Family Medicine

## 2013-03-13 NOTE — Telephone Encounter (Signed)
Pt just having stuffy nose and cough. Told her it was more than likely viral. I told her if symptoms get worst, to call us back on Monday. Pt verbalized understanding.

## 2013-03-13 NOTE — Telephone Encounter (Signed)
Patient came in last week to get meds refilled and believes she got a URI while she was in here. She is hoping to get a zpac and cough medicine.   [facility] 

## 2013-03-13 NOTE — Telephone Encounter (Signed)
Sounds viral. First discuss symptoms, if feverrs, sinus pain , wheeze or worse follow up today, if more just stuffy and drainage then sounds more viral ,typically if greater than 7 days or sinus pain or fever or wheeze then NTBS otherwise most will trun the corner within 7 days, otc meds fine

## 2013-03-13 NOTE — Telephone Encounter (Signed)
Patient has a cough and runny nose that started Mon. She denies difficulty breathing, body aches, HA, fever, diarrhea, nor vomiting.

## 2013-03-16 ENCOUNTER — Ambulatory Visit (INDEPENDENT_AMBULATORY_CARE_PROVIDER_SITE_OTHER): Payer: 59 | Admitting: Family Medicine

## 2013-03-16 ENCOUNTER — Encounter: Payer: Self-pay | Admitting: Family Medicine

## 2013-03-16 VITALS — BP 112/74 | Temp 98.6°F | Ht 69.5 in | Wt 254.0 lb

## 2013-03-16 DIAGNOSIS — J069 Acute upper respiratory infection, unspecified: Secondary | ICD-10-CM

## 2013-03-16 MED ORDER — HYDROCODONE-HOMATROPINE 5-1.5 MG/5ML PO SYRP: 5.0000 mL | ORAL_SOLUTION | Freq: Four times a day (QID) | ORAL | Status: DC | PRN

## 2013-03-16 MED ORDER — CEFPROZIL 500 MG PO TABS: 500.0000 mg | ORAL_TABLET | Freq: Two times a day (BID) | ORAL | Status: DC

## 2013-03-16 NOTE — Progress Notes (Signed)
   Subjective:    Patient ID: Carol Sharp, female    DOB: 02-05-1976, 38 y.o.   MRN: [id number]  Cough This is a new problem. The current episode started in the past 7 days. Associated symptoms include a fever, headaches, rhinorrhea and a sore throat. Pertinent negatives include no chest pain, ear pain, shortness of breath or wheezing. Associated symptoms comments: diarrhea. The treatment provided no relief (mucinex and allegra).   Started last 6 days ago, no better, fever over the weekend   Review of Systems  Constitutional: Positive for fever. Negative for activity change.  HENT: Positive for congestion, rhinorrhea and sore throat. Negative for ear pain.   Eyes: Negative for discharge.  Respiratory: Positive for cough. Negative for shortness of breath and wheezing.   Cardiovascular: Negative for chest pain.  Gastrointestinal: Positive for nausea and diarrhea. Negative for vomiting.  Neurological: Positive for headaches.       Objective:   Physical Exam  Nursing note and vitals reviewed. Constitutional: She appears well-developed.  HENT:  Head: Normocephalic.  Nose: Nose normal.  Mouth/Throat: Oropharynx is clear and moist. No oropharyngeal exudate.  Neck: Neck supple.  Cardiovascular: Normal rate and normal heart sounds.   No murmur heard. Pulmonary/Chest: Effort normal and breath sounds normal. She has no wheezes.  Lymphadenopathy:    She has no cervical adenopathy.  Skin: Skin is warm and dry.          Assessment & Plan:  Upper respiratory illness along with bronchitis/sinusitis Cefzil 10 days warning signs discussed call if problems

## 2013-03-18 ENCOUNTER — Encounter: Payer: Self-pay | Admitting: Family Medicine

## 2013-03-18 ENCOUNTER — Telehealth: Payer: Self-pay | Admitting: Family Medicine

## 2013-03-18 NOTE — Telephone Encounter (Signed)
Patient needs extension for her work excuse for the rest of this week. She will return 03/23/2013.

## 2013-03-18 NOTE — Telephone Encounter (Signed)
Notified patient.

## 2013-03-18 NOTE — Telephone Encounter (Signed)
plz give

## 2013-03-23 ENCOUNTER — Telehealth: Payer: Self-pay | Admitting: Family Medicine

## 2013-03-23 MED ORDER — FLUCONAZOLE 150 MG PO TABS: 150.0000 mg | ORAL_TABLET | Freq: Every day | ORAL | Status: DC

## 2013-03-23 NOTE — Telephone Encounter (Signed)
Diflucan 150, 1 po times one

## 2013-03-23 NOTE — Telephone Encounter (Signed)
Patient notified

## 2013-03-23 NOTE — Telephone Encounter (Signed)
Patient need Rx for yeast infection due to antibiotic. [facility]

## 2013-05-29 ENCOUNTER — Telehealth: Payer: Self-pay | Admitting: Nurse Practitioner

## 2013-05-29 MED ORDER — HYDROCODONE-ACETAMINOPHEN 5-325 MG PO TABS: 1.0000 | ORAL_TABLET | Freq: Four times a day (QID) | ORAL | Status: DC | PRN

## 2013-05-29 NOTE — Telephone Encounter (Signed)
Patient states she has an appointment with podiatry in 4 more weeks. Explained to patient pain medication is not for long term use. Patient verbalized understanding. Will come by office and pick up script before the end of the day.

## 2013-05-29 NOTE — Telephone Encounter (Signed)
Pt states that the tramadol is not working for her at this point for her foot  Can we prescribe her something strong for this?

## 2013-05-29 NOTE — Telephone Encounter (Signed)
Hydrocodone 5 mg/325 mg, #24, no refills, not for long-term use, one every 6 hours when necessary severe pain. Cautioned drowsiness. I also highly recommend seeing podiatry. Either Dr. Berline Lopes or Dr. Caprice Beaver. Pain medication for long-term use is not the answer

## 2013-06-19 ENCOUNTER — Other Ambulatory Visit: Payer: Self-pay | Admitting: Family Medicine

## 2013-07-15 ENCOUNTER — Encounter: Payer: Self-pay | Admitting: Nurse Practitioner

## 2013-07-15 ENCOUNTER — Ambulatory Visit (INDEPENDENT_AMBULATORY_CARE_PROVIDER_SITE_OTHER): Payer: 59 | Admitting: Nurse Practitioner

## 2013-07-15 VITALS — BP 140/92 | Ht 69.5 in | Wt 242.2 lb

## 2013-07-15 DIAGNOSIS — I10 Essential (primary) hypertension: Secondary | ICD-10-CM

## 2013-07-15 DIAGNOSIS — F4323 Adjustment disorder with mixed anxiety and depressed mood: Secondary | ICD-10-CM

## 2013-07-15 MED ORDER — HYDROCODONE-ACETAMINOPHEN 5-325 MG PO TABS: 1.0000 | ORAL_TABLET | ORAL | Status: DC | PRN

## 2013-07-15 MED ORDER — PHENTERMINE HCL 37.5 MG PO TABS: 37.5000 mg | ORAL_TABLET | Freq: Every day | ORAL | Status: DC

## 2013-07-15 MED ORDER — ALPRAZOLAM 1 MG PO TABS: 1.0000 mg | ORAL_TABLET | Freq: Every evening | ORAL | Status: DC | PRN

## 2013-07-15 MED ORDER — HYDROCHLOROTHIAZIDE 25 MG PO TABS: ORAL_TABLET | ORAL | Status: DC

## 2013-07-15 MED ORDER — CITALOPRAM HYDROBROMIDE 10 MG PO TABS: 10.0000 mg | ORAL_TABLET | Freq: Every day | ORAL | Status: DC

## 2013-07-18 ENCOUNTER — Encounter: Payer: Self-pay | Admitting: Nurse Practitioner

## 2013-07-18 NOTE — Progress Notes (Signed)
Subjective:  Presents for recheck. Using xanax only at night. Has chronic right foot pain due to a ganglion cyst. Has seen podiatrist who has recommended surgery. Patient wants to avoid surgery if possible. Continues to work. Limited exercise due to pain. No CP/ischemic type pain, SOB or edema. Doing well on current dose of Celexa.   Objective:   BP 140/92  Ht 5' 9.5" (1.765 m)  Wt 242 lb 3.2 oz (109.861 kg)  BMI 35.27 kg/m2 NAD. Alert, oriented. Lungs clear. Heart RRR. Lower extremities no edema.   Assessment:  Problem List Items Addressed This Visit     Cardiovascular and Mediastinum   Benign essential hypertension - Primary   Relevant Medications      hydrochlorothiazide tablet     Other   Adjustment disorder with mixed anxiety and depressed mood   Morbid obesity   Relevant Medications      phentermine (ADIPEX-P) 37.5 MG tablet     Plan:  Meds ordered this encounter  Medications  . phentermine (ADIPEX-P) 37.5 MG tablet    Sig: Take 1 tablet (37.5 mg total) by mouth daily before breakfast.    Dispense:  30 tablet    Refill:  2    Order Specific Question:  Supervising Provider    Answer:  Mikey Kirschner [[id number]]  . HYDROcodone-acetaminophen (NORCO/VICODIN) 5-325 MG per tablet    Sig: Take 1 tablet by mouth every 4 (four) hours as needed for severe pain.    Dispense:  24 tablet    Refill:  0    Order Specific Question:  Supervising Provider    Answer:  Mikey Kirschner [[id number]]  . hydrochlorothiazide (HYDRODIURIL) 25 MG tablet    Sig: TAKE 1 TABLET (25 MG TOTAL) BY MOUTH DAILY. FOR BLOOD PRESSURE AND FLUID    Dispense:  30 tablet    Refill:  5    Order Specific Question:  Supervising Provider    Answer:  Mikey Kirschner [[id number]]  . citalopram (CELEXA) 10 MG tablet    Sig: Take 1 tablet (10 mg total) by mouth daily.    Dispense:  30 tablet    Refill:  5    Order Specific Question:  Supervising Provider    Answer:  Mikey Kirschner [[id number]]  . ALPRAZolam (XANAX) 1 MG  tablet    Sig: Take 1 tablet (1 mg total) by mouth at bedtime as needed for sleep.    Dispense:  30 tablet    Refill:  2    Order Specific Question:  Supervising Provider    Answer:  Mikey Kirschner [[id number]]  . traMADol (ULTRAM) 50 MG tablet    Sig:    Encouraged continued weight loss and activity as tolerated. Use pain meds sparingly; reviewed addiction potential of meds. Do not take pain meds and xanax at the same time. Recheck in 3-4 months.

## 2013-10-02 ENCOUNTER — Ambulatory Visit (INDEPENDENT_AMBULATORY_CARE_PROVIDER_SITE_OTHER): Payer: 59 | Admitting: Nurse Practitioner

## 2013-10-02 ENCOUNTER — Encounter: Payer: Self-pay | Admitting: Nurse Practitioner

## 2013-10-02 VITALS — BP 130/82 | Ht 69.5 in | Wt 247.0 lb

## 2013-10-02 DIAGNOSIS — M545 Low back pain, unspecified: Secondary | ICD-10-CM

## 2013-10-02 DIAGNOSIS — M546 Pain in thoracic spine: Secondary | ICD-10-CM

## 2013-10-02 DIAGNOSIS — I10 Essential (primary) hypertension: Secondary | ICD-10-CM

## 2013-10-02 MED ORDER — HYDROCODONE-ACETAMINOPHEN 10-325 MG PO TABS
1.0000 | ORAL_TABLET | ORAL | Status: DC | PRN
Start: 2013-10-02 — End: 2014-01-08

## 2013-10-02 MED ORDER — TIZANIDINE HCL 4 MG PO TABS: 4.0000 mg | ORAL_TABLET | Freq: Four times a day (QID) | ORAL | Status: DC | PRN

## 2013-10-02 NOTE — Patient Instructions (Signed)
Vertical sleeve gastrectomy 

## 2013-10-05 ENCOUNTER — Ambulatory Visit: Payer: 59 | Admitting: Family Medicine

## 2013-10-05 ENCOUNTER — Ambulatory Visit: Payer: 59 | Admitting: Nurse Practitioner

## 2013-10-08 ENCOUNTER — Encounter: Payer: Self-pay | Admitting: Nurse Practitioner

## 2013-10-08 NOTE — Progress Notes (Signed)
Subjective:  Presents for recheck on her BP. No ischemic type pain or SOB. No edema. C/o back pain that began on 8/1 after dancing; better for a time. Helped her husband ambulate, describes a pulling motion that began a week ago. Now having low back pain radiating into the right buttock, does not radiate down leg. Also pain into the right thoracic area. Worse with sitting or prolonged standing. Has to lie down on left side with pillow between legs. Some relief with 2 hydrocodone and Ibuprofen 800 mg.  Objective:   BP 130/82  Ht 5' 9.5" (1.765 m)  Wt 247 lb (112.038 kg)  BMI 35.96 kg/m2 NAD. Alert, oriented. Lungs clear. Heart RRR. Tenderness with palpation of the right back area. SLR negative bilateral. Reflexes normal limit lower extremities.  Assessment: Right-sided thoracic back pain/back strain  Right-sided low back pain without sciatica  Benign essential hypertension  Plan:  Meds ordered this encounter  Medications  . tiZANidine (ZANAFLEX) 4 MG tablet    Sig: Take 1 tablet (4 mg total) by mouth every 6 (six) hours as needed for muscle spasms.    Dispense:  30 tablet    Refill:  0    Order Specific Question:  Supervising Provider    Answer:  Mikey Kirschner [[id number]]  . HYDROcodone-acetaminophen (NORCO) 10-325 MG per tablet    Sig: Take 1 tablet by mouth every 4 (four) hours as needed.    Dispense:  30 tablet    Refill:  0    Order Specific Question:  Supervising Provider    Answer:  Mikey Kirschner [[id number]]   Continue ibuprofen as directed. Ice/heat applications. Stretching exercises. TENS unit as directed. Call back next week if no improvement, sooner if worse. Avoid any heavy lifting or pulling.

## 2013-12-18 ENCOUNTER — Other Ambulatory Visit: Payer: Self-pay | Admitting: Nurse Practitioner

## 2013-12-18 ENCOUNTER — Telehealth: Payer: Self-pay | Admitting: Nurse Practitioner

## 2013-12-18 MED ORDER — CITALOPRAM HYDROBROMIDE 20 MG PO TABS: ORAL_TABLET | ORAL | Status: DC

## 2013-12-18 NOTE — Telephone Encounter (Signed)
error 

## 2013-12-30 ENCOUNTER — Telehealth: Payer: Self-pay | Admitting: Nurse Practitioner

## 2013-12-30 NOTE — Telephone Encounter (Signed)
Patient said that at one time her and Hoyle Sauer had talked about medication options for her.  She said that she was supposed to call back and discuss this with Hoyle Sauer.

## 2013-12-30 NOTE — Telephone Encounter (Signed)
I transferred her up front to make an appt with Hoyle Sauer.

## 2014-01-08 ENCOUNTER — Ambulatory Visit (INDEPENDENT_AMBULATORY_CARE_PROVIDER_SITE_OTHER): Payer: 59 | Admitting: Nurse Practitioner

## 2014-01-08 ENCOUNTER — Encounter: Payer: Self-pay | Admitting: Nurse Practitioner

## 2014-01-08 VITALS — BP 132/90 | Ht 69.5 in | Wt 257.5 lb

## 2014-01-08 DIAGNOSIS — I10 Essential (primary) hypertension: Secondary | ICD-10-CM

## 2014-01-08 DIAGNOSIS — F4323 Adjustment disorder with mixed anxiety and depressed mood: Secondary | ICD-10-CM

## 2014-01-08 MED ORDER — HYDROCODONE-ACETAMINOPHEN 10-325 MG PO TABS: 1.0000 | ORAL_TABLET | ORAL | Status: DC | PRN

## 2014-01-08 MED ORDER — LORCASERIN HCL 10 MG PO TABS: ORAL_TABLET | ORAL | Status: DC

## 2014-01-08 MED ORDER — CITALOPRAM HYDROBROMIDE 20 MG PO TABS: ORAL_TABLET | ORAL | Status: DC

## 2014-01-08 MED ORDER — ALPRAZOLAM 1 MG PO TABS: 1.0000 mg | ORAL_TABLET | Freq: Every evening | ORAL | Status: DC | PRN

## 2014-01-08 MED ORDER — HYDROCHLOROTHIAZIDE 25 MG PO TABS
ORAL_TABLET | ORAL | Status: DC
Start: 2014-01-08 — End: 2014-06-04

## 2014-01-11 ENCOUNTER — Encounter: Payer: Self-pay | Admitting: Family Medicine

## 2014-01-11 NOTE — Telephone Encounter (Signed)
Carol Sharp , I believe this message is intended for you. Thanks ,[employer]

## 2014-01-12 ENCOUNTER — Encounter: Payer: Self-pay | Admitting: Nurse Practitioner

## 2014-01-12 NOTE — Progress Notes (Signed)
Subjective:  Presents for routine follow-up. Has tried to increase her exercise by riding her elliptical, after 7 minutes her knee pain is so severe she must stop. Would like to increase her Celexa to one tab per day due to increased stress. Rare use of Xanax. Would also like to try belviq for weight loss. No chest pain/ischemic type pain or unusual shortness of breath. Rare use of hydrocodone only for severe pain.  Objective:   BP 132/90 mmHg  Ht 5' 9.5" (1.765 m)  Wt 257 lb 8 oz (116.801 kg)  BMI 37.49 kg/m2 NAD. Alert, oriented. Lungs clear. Heart regular rate rhythm. No murmur or gallop noted.  Assessment: Benign essential hypertension  Adjustment disorder with mixed anxiety and depressed mood  Morbid obesity  Plan:  Meds ordered this encounter  Medications  . HYDROcodone-acetaminophen (NORCO) 10-325 MG per tablet    Sig: Take 1 tablet by mouth every 4 (four) hours as needed.    Dispense:  30 tablet    Refill:  0    Order Specific Question:  Supervising Provider    Answer:  Mikey Kirschner [[id number]]  . hydrochlorothiazide (HYDRODIURIL) 25 MG tablet    Sig: TAKE 1 TABLET (25 MG TOTAL) BY MOUTH DAILY. FOR BLOOD PRESSURE AND FLUID    Dispense:  90 tablet    Refill:  1    Order Specific Question:  Supervising Provider    Answer:  Mikey Kirschner [[id number]]  . citalopram (CELEXA) 20 MG tablet    Sig: one tab po qd    Dispense:  90 tablet    Refill:  1    Order Specific Question:  Supervising Provider    Answer:  Mikey Kirschner [[id number]]  . ALPRAZolam (XANAX) 1 MG tablet    Sig: Take 1 tablet (1 mg total) by mouth at bedtime as needed for sleep.    Dispense:  30 tablet    Refill:  2    Order Specific Question:  Supervising Provider    Answer:  Mikey Kirschner [[id number]]  . Lorcaserin HCl 10 MG TABS    Sig: One po BID    Dispense:  28 tablet    Refill:  0    Order Specific Question:  Supervising Provider    Answer:  Mikey Kirschner [[id number]]  . Lorcaserin HCl 10 MG TABS   Sig: One po BID for weight loss    Dispense:  60 tablet    Refill:  2    Order Specific Question:  Supervising Provider    Answer:  Mikey Kirschner [[id number]]   Increase Celexa 20 mg to one tab daily. Do not take Xanax and hydrocodone at the same time. Also discussed bariatric surgery. Continue activity as tolerated. Return in about 6 months (around 07/09/2014). sooner if she wishes to continue belviq.

## 2014-03-30 ENCOUNTER — Other Ambulatory Visit: Payer: Self-pay | Admitting: Nurse Practitioner

## 2014-03-30 ENCOUNTER — Encounter: Payer: Self-pay | Admitting: Family Medicine

## 2014-03-30 ENCOUNTER — Encounter: Payer: Self-pay | Admitting: Nurse Practitioner

## 2014-04-01 ENCOUNTER — Encounter: Payer: Self-pay | Admitting: Nurse Practitioner

## 2014-04-01 ENCOUNTER — Ambulatory Visit (INDEPENDENT_AMBULATORY_CARE_PROVIDER_SITE_OTHER): Payer: 59 | Admitting: Nurse Practitioner

## 2014-04-01 ENCOUNTER — Encounter: Payer: Self-pay | Admitting: Family Medicine

## 2014-04-01 VITALS — BP 120/88 | Temp 97.9°F | Ht 69.5 in | Wt 267.2 lb

## 2014-04-01 DIAGNOSIS — A084 Viral intestinal infection, unspecified: Secondary | ICD-10-CM

## 2014-04-01 DIAGNOSIS — B9689 Other specified bacterial agents as the cause of diseases classified elsewhere: Secondary | ICD-10-CM

## 2014-04-01 DIAGNOSIS — J069 Acute upper respiratory infection, unspecified: Secondary | ICD-10-CM

## 2014-04-01 MED ORDER — HYDROCODONE-HOMATROPINE 5-1.5 MG/5ML PO SYRP: 5.0000 mL | ORAL_SOLUTION | ORAL | Status: DC | PRN

## 2014-04-01 MED ORDER — AZITHROMYCIN 250 MG PO TABS: ORAL_TABLET | ORAL | Status: DC

## 2014-04-01 MED ORDER — HYDROCODONE-ACETAMINOPHEN 10-325 MG PO TABS: 1.0000 | ORAL_TABLET | ORAL | Status: DC | PRN

## 2014-04-01 NOTE — Patient Instructions (Signed)
Align

## 2014-04-02 ENCOUNTER — Encounter: Payer: Self-pay | Admitting: Nurse Practitioner

## 2014-04-02 NOTE — Progress Notes (Signed)
Subjective:  Presents with complaints of sore throat and body aches for the past 7 days. Began with frequent vomiting and diarrhea which has improved. Now having formed stools. No further vomiting. Frequent cough. Runny nose. Posterior chest pain with cough. No fever. No ear pain. Taking fluids well. Voiding normal limit. No wheezing.  Objective:   BP 120/88 mmHg  Temp(Src) 97.9 F (36.6 C) (Oral)  Ht 5' 9.5" (1.765 m)  Wt 267 lb 4 oz (121.224 kg)  BMI 38.91 kg/m2 NAD. Alert, oriented. Fatigued in appearance. TMs retracted bilateral, no erythema. Pharynx mildly injected with green PND noted. Neck supple with mild soft anterior adenopathy. Lungs clear. Heart regular rate rhythm. Abdomen soft nondistended with minimal epigastric area tenderness, otherwise benign.  Assessment: Viral gastroenteritis  Bacterial upper respiratory infection   Plan:  Meds ordered this encounter  Medications  . azithromycin (ZITHROMAX Z-PAK) 250 MG tablet    Sig: Take 2 tablets (500 mg) on  Day 1,  followed by 1 tablet (250 mg) once daily on Days 2 through 5.    Dispense:  6 each    Refill:  0    Order Specific Question:  Supervising Provider    Answer:  Mikey Kirschner [[id number]]  . HYDROcodone-homatropine (HYCODAN) 5-1.5 MG/5ML syrup    Sig: Take 5 mLs by mouth every 4 (four) hours as needed.    Dispense:  120 mL    Refill:  0    Order Specific Question:  Supervising Provider    Answer:  Mikey Kirschner [[id number]]  . HYDROcodone-acetaminophen (NORCO) 10-325 MG per tablet    Sig: Take 1 tablet by mouth every 4 (four) hours as needed.    Dispense:  30 tablet    Refill:  0    Order Specific Question:  Supervising Provider    Answer:  Mikey Kirschner [[id number]]   OTC meds as directed for congestion and cough. Patient requesting a refill on her pain medicine today, using very sparingly. Understands not to take this with her hydrocodone cough medicine. Call back next week if no improvement symptoms, sooner if  worse. Also recommend rehydration solution such as Gatorade.

## 2014-04-05 ENCOUNTER — Ambulatory Visit: Payer: Self-pay | Admitting: Nurse Practitioner

## 2014-04-19 ENCOUNTER — Other Ambulatory Visit: Payer: Self-pay | Admitting: Nurse Practitioner

## 2014-04-28 ENCOUNTER — Ambulatory Visit (INDEPENDENT_AMBULATORY_CARE_PROVIDER_SITE_OTHER): Payer: 59 | Admitting: Nurse Practitioner

## 2014-04-28 ENCOUNTER — Encounter: Payer: Self-pay | Admitting: Nurse Practitioner

## 2014-04-28 VITALS — BP 122/90 | Temp 98.6°F | Ht 69.5 in | Wt 267.4 lb

## 2014-04-28 DIAGNOSIS — J069 Acute upper respiratory infection, unspecified: Secondary | ICD-10-CM

## 2014-04-28 DIAGNOSIS — B9689 Other specified bacterial agents as the cause of diseases classified elsewhere: Secondary | ICD-10-CM

## 2014-04-28 MED ORDER — FLUCONAZOLE 150 MG PO TABS: ORAL_TABLET | ORAL | Status: DC

## 2014-04-28 MED ORDER — METHYLPREDNISOLONE ACETATE 40 MG/ML IJ SUSP
40.0000 mg | Freq: Once | INTRAMUSCULAR | Status: AC
  Administered 2014-04-28: 40 mg via INTRAMUSCULAR

## 2014-04-28 MED ORDER — LEVOFLOXACIN 500 MG PO TABS
500.0000 mg | ORAL_TABLET | Freq: Every day | ORAL | Status: DC
Start: 2014-04-28 — End: 2014-08-26

## 2014-04-28 MED ORDER — HYDROCODONE-ACETAMINOPHEN 10-325 MG PO TABS: 1.0000 | ORAL_TABLET | ORAL | Status: DC | PRN

## 2014-05-01 ENCOUNTER — Encounter: Payer: Self-pay | Admitting: Nurse Practitioner

## 2014-05-01 NOTE — Progress Notes (Signed)
Subjective:  Presents for c/o cough and sinus symptoms x 2 weeks. Zpack helped but symptoms came back. Fever resolved. Frontal headache. Scratchy throat. No ear pain. No wheezing.   Objective:   BP 122/90 mmHg  Temp(Src) 98.6 F (37 C) (Oral)  Ht 5' 9.5" (1.765 m)  Wt 267 lb 6 oz (121.281 kg)  BMI 38.93 kg/m2 NAD. Alert, oriented. TMs clear effusion. Pharynx injected with PND noted. Neck supple with mild anterior adenopathy. Lungs clear. Heart RRR.   Assessment: Bacterial upper respiratory infection - Plan: methylPREDNISolone acetate (DEPO-MEDROL) injection 40 mg  Plan:  Meds ordered this encounter  Medications  . levofloxacin (LEVAQUIN) 500 MG tablet    Sig: Take 1 tablet (500 mg total) by mouth daily.    Dispense:  10 tablet    Refill:  0    Order Specific Question:  Supervising Provider    Answer:  Mikey Kirschner [[id number]]  . fluconazole (DIFLUCAN) 150 MG tablet    Sig: One po qd prn yeast infection; may repeat in 3-4 days if needed; take after Levaquin is done    Dispense:  2 tablet    Refill:  0    Order Specific Question:  Supervising Provider    Answer:  Mikey Kirschner [[id number]]  . HYDROcodone-acetaminophen (NORCO) 10-325 MG per tablet    Sig: Take 1 tablet by mouth every 4 (four) hours as needed.    Dispense:  30 tablet    Refill:  0    Order Specific Question:  Supervising Provider    Answer:  Mikey Kirschner [[id number]]  . methylPREDNISolone acetate (DEPO-MEDROL) injection 40 mg    Sig:    Restart Flonase as directed. OTC antihistamine as directed. Call back if worsens or persists.

## 2014-05-07 ENCOUNTER — Other Ambulatory Visit: Payer: Self-pay | Admitting: Nurse Practitioner

## 2014-05-07 MED ORDER — TERCONAZOLE 0.4 % VA CREA: 1.0000 | TOPICAL_CREAM | Freq: Every day | VAGINAL | Status: DC

## 2014-06-03 ENCOUNTER — Other Ambulatory Visit: Payer: Self-pay | Admitting: Nurse Practitioner

## 2014-06-04 ENCOUNTER — Other Ambulatory Visit: Payer: Self-pay | Admitting: Nurse Practitioner

## 2014-06-04 MED ORDER — HYDROCHLOROTHIAZIDE 25 MG PO TABS: ORAL_TABLET | ORAL | Status: DC

## 2014-06-04 MED ORDER — HYDROCODONE-ACETAMINOPHEN 10-325 MG PO TABS: 1.0000 | ORAL_TABLET | ORAL | Status: DC | PRN

## 2014-06-04 NOTE — Telephone Encounter (Signed)
Called pt. She needs refill on hydrocodone, HCTZ, and she would like to try contrave.

## 2014-06-04 NOTE — Telephone Encounter (Signed)
Pt notifed hydrocodone ready for pick up and Hctz was sent [facility]pharm. Pt advised she cannot take contrave while on pain med.

## 2014-08-26 ENCOUNTER — Ambulatory Visit (INDEPENDENT_AMBULATORY_CARE_PROVIDER_SITE_OTHER): Payer: 59 | Admitting: Nurse Practitioner

## 2014-08-26 ENCOUNTER — Encounter: Payer: Self-pay | Admitting: Nurse Practitioner

## 2014-08-26 ENCOUNTER — Encounter: Payer: Self-pay | Admitting: Family Medicine

## 2014-08-26 VITALS — BP 134/98 | Temp 98.7°F | Ht 69.5 in | Wt 275.5 lb

## 2014-08-26 DIAGNOSIS — F419 Anxiety disorder, unspecified: Secondary | ICD-10-CM

## 2014-08-26 DIAGNOSIS — F430 Acute stress reaction: Principal | ICD-10-CM

## 2014-08-26 DIAGNOSIS — F411 Generalized anxiety disorder: Secondary | ICD-10-CM

## 2014-08-26 MED ORDER — ALPRAZOLAM 1 MG PO TABS: ORAL_TABLET | ORAL | Status: DC

## 2014-08-26 NOTE — Patient Instructions (Signed)
Increase Celexa to 1 1/2 tabs per day for 2 weeks then if needed may increase to 2 tabs per day

## 2014-08-27 ENCOUNTER — Encounter: Payer: Self-pay | Admitting: Nurse Practitioner

## 2014-08-27 NOTE — Progress Notes (Signed)
Subjective:   Presents for complaints of extreme anxiety over the past  6 days. Crying uncontrollably. Difficulty sleeping. Dull headache. Began when she found out her youngest daughter had been communicating with older man online. To her knowledge, she has not had any physical contact with these men but is not sure at this time. This is caused extreme anxiety for her this week. Has been unable to work yesterday due to extreme anxiety.  Objective:   BP 134/98 mmHg  Temp(Src) 98.7 F (37.1 C) (Oral)  Ht 5' 9.5" (1.765 m)  Wt 275 lb 8 oz (124.966 kg)  BMI 40.11 kg/m2  NAD. Alert, oriented. Thoughts logical coherent and relevant. Patient crying during most of the office visit. Lungs clear. Heart regular rate rhythm.  Assessment: Anxiety as acute reaction to exceptional stress  Plan:  Meds ordered this encounter  Medications  . ALPRAZolam (XANAX) 1 MG tablet    Sig: 1/2 - 1 po BID prn anxiety    Dispense:  30 tablet    Refill:  2    Order Specific Question:  Supervising Provider    Answer:  Mikey Kirschner [[id number]]    Increase Celexa to 1 1/2 tabs per day for 2 weeks then if needed may increase to 2 tabs per day. Has an excellent support system. Strongly recommend patient contact law enforcement for advice and for them to talk to her daughter. Work note given for the rest of this week. Call back if no improvement over the next several days, also in her office note if any counseling is needed for her or her daughter who is our patient.

## 2014-09-06 ENCOUNTER — Other Ambulatory Visit: Payer: Self-pay | Admitting: Nurse Practitioner

## 2014-09-22 ENCOUNTER — Encounter: Payer: Self-pay | Admitting: Nurse Practitioner

## 2014-09-23 ENCOUNTER — Other Ambulatory Visit: Payer: Self-pay | Admitting: Nurse Practitioner

## 2014-09-23 MED ORDER — NALTREXONE-BUPROPION HCL ER 8-90 MG PO TB12: ORAL_TABLET | ORAL | Status: DC

## 2014-10-26 ENCOUNTER — Encounter: Payer: Self-pay | Admitting: Nurse Practitioner

## 2014-11-30 ENCOUNTER — Encounter: Payer: Self-pay | Admitting: Nurse Practitioner

## 2014-12-01 ENCOUNTER — Other Ambulatory Visit: Payer: Self-pay | Admitting: Nurse Practitioner

## 2014-12-01 MED ORDER — PHENTERMINE HCL 37.5 MG PO TABS: 37.5000 mg | ORAL_TABLET | Freq: Every day | ORAL | Status: DC

## 2015-01-21 ENCOUNTER — Other Ambulatory Visit: Payer: Self-pay | Admitting: Nurse Practitioner

## 2015-02-07 ENCOUNTER — Encounter: Payer: Self-pay | Admitting: Nurse Practitioner

## 2015-02-07 ENCOUNTER — Ambulatory Visit (INDEPENDENT_AMBULATORY_CARE_PROVIDER_SITE_OTHER): Payer: 59 | Admitting: Nurse Practitioner

## 2015-02-07 ENCOUNTER — Encounter: Payer: Self-pay | Admitting: Family Medicine

## 2015-02-07 VITALS — BP 124/82 | Temp 98.2°F | Ht 69.5 in | Wt 278.1 lb

## 2015-02-07 DIAGNOSIS — J069 Acute upper respiratory infection, unspecified: Secondary | ICD-10-CM

## 2015-02-07 DIAGNOSIS — J029 Acute pharyngitis, unspecified: Secondary | ICD-10-CM | POA: Diagnosis not present

## 2015-02-07 LAB — POCT RAPID STREP A (OFFICE): Rapid Strep A Screen: NEGATIVE

## 2015-02-07 MED ORDER — HYDROCODONE-HOMATROPINE 5-1.5 MG/5ML PO SYRP: 5.0000 mL | ORAL_SOLUTION | ORAL | Status: DC | PRN

## 2015-02-08 LAB — STREP A DNA PROBE: Strep Gp A Direct, DNA Probe: NEGATIVE

## 2015-02-09 ENCOUNTER — Encounter: Payer: Self-pay | Admitting: Nurse Practitioner

## 2015-02-09 NOTE — Progress Notes (Signed)
Subjective:  Presents for c/o head congestion and cough x 2 d. Producing green sputum at times. Chest tightness with cough. Muscle aches. Possible fever. Sore throat. No ear pain. Headache. Taking fluids well. Voiding normal limit.  Objective:   BP 124/82 mmHg  Temp(Src) 98.2 F (36.8 C) (Oral)  Ht 5' 9.5" (1.765 m)  Wt 278 lb 2 oz (126.157 kg)  BMI 40.50 kg/m2 NAD. Alert, oriented. TMs clear effusion, no erythema. Pharynx moderate erythema, RST negative. Mucous membranes moist. Neck supple with mild soft anterior adenopathy.. Lungs clear. Heart regular rate rhythm.  Assessment: Acute upper respiratory infection - Plan: POCT rapid strep A, Strep A DNA probe  Acute pharyngitis, unspecified etiology - Plan: POCT rapid strep A, Strep A DNA probe  Plan:  Meds ordered this encounter  Medications  . HYDROcodone-homatropine (HYCODAN) 5-1.5 MG/5ML syrup    Sig: Take 5 mLs by mouth every 4 (four) hours as needed.    Dispense:  120 mL    Refill:  0    Order Specific Question:  Supervising Provider    Answer:  Mikey Kirschner [[id number]]   Reviewed symptomatic care and warning signs.Call back by the end of the week if no improvement, sooner if worse.

## 2015-02-10 ENCOUNTER — Other Ambulatory Visit: Payer: Self-pay | Admitting: Nurse Practitioner

## 2015-02-10 ENCOUNTER — Encounter: Payer: Self-pay | Admitting: Nurse Practitioner

## 2015-02-10 MED ORDER — AMOXICILLIN-POT CLAVULANATE 875-125 MG PO TABS: 1.0000 | ORAL_TABLET | Freq: Two times a day (BID) | ORAL | Status: DC

## 2015-03-04 ENCOUNTER — Encounter: Payer: Self-pay | Admitting: Nurse Practitioner

## 2015-03-04 ENCOUNTER — Other Ambulatory Visit: Payer: Self-pay | Admitting: Nurse Practitioner

## 2015-03-04 NOTE — Telephone Encounter (Signed)
Pt calling to see if you will be sending this in today?   [facility]

## 2015-03-31 ENCOUNTER — Encounter: Payer: Self-pay | Admitting: Nurse Practitioner

## 2015-03-31 ENCOUNTER — Encounter: Payer: Self-pay | Admitting: Family Medicine

## 2015-04-01 ENCOUNTER — Other Ambulatory Visit: Payer: Self-pay | Admitting: Nurse Practitioner

## 2015-04-01 MED ORDER — FLUCONAZOLE 150 MG PO TABS: ORAL_TABLET | ORAL | Status: DC

## 2015-05-13 ENCOUNTER — Encounter: Payer: Self-pay | Admitting: Nurse Practitioner

## 2015-05-17 ENCOUNTER — Emergency Department (HOSPITAL_COMMUNITY)
Admission: EM | Admit: 2015-05-17 | Discharge: 2015-05-17 | Disposition: A | Discharge status: Home / Self Care | Payer: 59 | Attending: Emergency Medicine | Admitting: Emergency Medicine

## 2015-05-17 ENCOUNTER — Encounter (HOSPITAL_COMMUNITY): Payer: Self-pay | Admitting: Emergency Medicine

## 2015-05-17 ENCOUNTER — Emergency Department (HOSPITAL_COMMUNITY): Payer: 59

## 2015-05-17 DIAGNOSIS — R05 Cough: Secondary | ICD-10-CM | POA: Diagnosis present

## 2015-05-17 DIAGNOSIS — F17200 Nicotine dependence, unspecified, uncomplicated: Secondary | ICD-10-CM | POA: Insufficient documentation

## 2015-05-17 DIAGNOSIS — R6889 Other general symptoms and signs: Secondary | ICD-10-CM

## 2015-05-17 DIAGNOSIS — J111 Influenza due to unidentified influenza virus with other respiratory manifestations: Secondary | ICD-10-CM | POA: Diagnosis not present

## 2015-05-17 DIAGNOSIS — F329 Major depressive disorder, single episode, unspecified: Secondary | ICD-10-CM | POA: Diagnosis not present

## 2015-05-17 MED ORDER — KETOROLAC TROMETHAMINE 30 MG/ML IJ SOLN
30.0000 mg | Freq: Once | INTRAMUSCULAR | Status: AC
  Administered 2015-05-17: 30 mg via INTRAMUSCULAR
  Filled 2015-05-17: qty 1

## 2015-05-17 MED ORDER — HYDROCOD POLST-CPM POLST ER 10-8 MG/5ML PO SUER: 5.0000 mL | Freq: Every evening | ORAL | Status: DC | PRN

## 2015-05-17 MED ORDER — ACETAMINOPHEN 325 MG PO TABS
650.0000 mg | ORAL_TABLET | Freq: Once | ORAL | Status: AC
  Administered 2015-05-17: 650 mg via ORAL
  Filled 2015-05-17: qty 2

## 2015-05-17 NOTE — ED Notes (Signed)
Flu symptoms since Sunday, chills, coughing, nausea, headache.  Denies any  V/d.

## 2015-05-17 NOTE — ED Provider Notes (Signed)
CSN: [id number]     Arrival date & time 05/17/15  0736 History   First MD Initiated Contact with Patient 05/17/15 0750     Chief Complaint  Patient presents with  . Influenza     Patient is a 40 y.o. female presenting with flu symptoms. The history is provided by the patient.  Influenza Presenting symptoms: cough, fatigue, fever, headache, myalgias, nausea, shortness of breath, sore throat and vomiting   Presenting symptoms: no diarrhea   Cough:    Cough characteristics:  Productive   Sputum characteristics:  Yellow   Severity:  Moderate   Onset quality:  Gradual   Duration:  3 days   Timing:  Intermittent   Progression:  Worsening   Chronicity:  New Fatigue:    Severity:  Moderate   Timing:  Constant   Progression:  Worsening Vomiting:    Quality:  Unable to specify   Severity:  Moderate   Timing:  Intermittent   Progression:  Worsening Exacerbated by: coughing. Associated symptoms: chills   Risk factors comment:  Smoker Patient with h/o Depression, presents with worsening cough/HA/post tussive emesis/myalgias/sore throat for past 3 days.  Nothing improves her symptoms.  No hemoptysis No travel She is a smoker She did not get flu vaccination She reports back pain and abdominal pain from coughing   Past Medical History  Diagnosis Date  . Anxiety   . Depression   . MRSA cellulitis May 2005   Past Surgical History  Procedure Laterality Date  . Tonsillectomy  1981?  . Dilation and curettage of uterus  Oct 2000  . Tubal ligation    . Ablation  May 2010   Family History  Problem Relation Age of Onset  . Hypertension Mother   . Diabetes Mother   . Hyperlipidemia Mother    Social History  Substance Use Topics  . Smoking status: Current Some Day Smoker  . Smokeless tobacco: None  . Alcohol Use: No   OB History    No data available     Review of Systems  Constitutional: Positive for fever, chills and fatigue.  HENT: Positive for sore throat.    Respiratory: Positive for cough and shortness of breath.   Gastrointestinal: Positive for nausea and vomiting. Negative for diarrhea.  Musculoskeletal: Positive for myalgias.  Skin: Negative for rash.  Neurological: Positive for headaches.  All other systems reviewed and are negative.     Allergies  Morphine and related  Home Medications   Prior to Admission medications   Medication Sig Start Date End Date Taking? Authorizing Provider  ALPRAZolam Duanne Moron) 1 MG tablet TAKE ONE-HALF TO ONE TABLET BY MOUTH TWICE DAILY AS NEEDED FOR ANXIETY 03/04/15   Nilda Simmer, NP  citalopram (CELEXA) 20 MG tablet TAKE ONE TABLET BY MOUTH ONCE DAILY 09/07/14   Nilda Simmer, NP  hydrochlorothiazide (HYDRODIURIL) 25 MG tablet TAKE ONE TABLET BY MOUTH ONCE DAILY FOR BLOOD PRESSURE AND  FLUID 01/21/15   Kathyrn Drown, MD  HYDROcodone-homatropine (HYCODAN) 5-1.5 MG/5ML syrup Take 5 mLs by mouth every 4 (four) hours as needed. 02/07/15   Nilda Simmer, NP  phentermine (ADIPEX-P) 37.5 MG tablet Take 1 tablet (37.5 mg total) by mouth daily before breakfast. 12/01/14   Nilda Simmer, NP  tiZANidine (ZANAFLEX) 4 MG tablet Take 1 tablet (4 mg total) by mouth every 6 (six) hours as needed for muscle spasms. 10/02/13   Nilda Simmer, NP   BP 142/90 mmHg  Pulse 91  Temp(Src) 100.8 F (38.2 C) (Oral)  Resp 18  Ht 5\' 9"  (1.753 m)  Wt 113.399 kg  BMI 36.90 kg/m2  SpO2 99% Physical Exam CONSTITUTIONAL: Well developed/well nourished HEAD: Normocephalic/atraumatic EYES: EOMI/PERRL ENMT: Mucous membranes moist, uvula midline, no erythema/exudates, bilateral TM's clear/intact NECK: supple no meningeal signs SPINE/BACK:entire spine nontender CV: S1/S2 noted, no murmurs/rubs/gallops noted LUNGS: decreased BS noted bilaterally, no distress noted ABDOMEN: soft, nontender, no rebound or guarding, bowel sounds noted throughout abdomen GU:no cva tenderness NEURO: Pt is awake/alert/appropriate,  moves all extremitiesx4.  No facial droop.   EXTREMITIES: pulses normal/equal, full ROM, no LE edema noted SKIN: warm, color normal, no rash noted PSYCH: no abnormalities of mood noted, alert and oriented to situation  ED Course  Procedures  Imaging Review Dg Chest 2 View  05/17/2015  CLINICAL DATA:  Cough and chills for 3 days, initial encounter EXAM: CHEST  2 VIEW COMPARISON:  None. FINDINGS: The heart size and mediastinal contours are within normal limits. Both lungs are clear. The visualized skeletal structures are unremarkable. IMPRESSION: No active cardiopulmonary disease. Electronically Signed   By: Inez Catalina M.D.   On: 05/17/2015 08:35   I have personally reviewed and evaluated these images  results as part of my medical decision-making.  Medications  acetaminophen (TYLENOL) tablet 650 mg (650 mg Oral Given 05/17/15 0821)  ketorolac (TORADOL) 30 MG/ML injection 30 mg (30 mg Intramuscular Given 05/17/15 0822)   Pt improved No distress Suspect influenza Advised rest, fluids Discussed strict return precautions   MDM   Final diagnoses:  Flu-like symptoms    Nursing notes including past medical history and social history reviewed and considered in documentation xrays/imaging reviewed by myself and considered during evaluation     Ripley Fraise, MD 05/17/15 [phone]

## 2015-06-13 ENCOUNTER — Ambulatory Visit (INDEPENDENT_AMBULATORY_CARE_PROVIDER_SITE_OTHER): Payer: 59 | Admitting: Nurse Practitioner

## 2015-06-13 ENCOUNTER — Encounter: Payer: Self-pay | Admitting: Nurse Practitioner

## 2015-06-13 VITALS — BP 132/82 | Ht 69.5 in | Wt 276.0 lb

## 2015-06-13 DIAGNOSIS — Z13220 Encounter for screening for lipoid disorders: Secondary | ICD-10-CM | POA: Diagnosis not present

## 2015-06-13 DIAGNOSIS — G4700 Insomnia, unspecified: Secondary | ICD-10-CM

## 2015-06-13 DIAGNOSIS — R5383 Other fatigue: Secondary | ICD-10-CM

## 2015-06-13 DIAGNOSIS — I10 Essential (primary) hypertension: Secondary | ICD-10-CM

## 2015-06-13 MED ORDER — HYDROCODONE-ACETAMINOPHEN 5-325 MG PO TABS: 1.0000 | ORAL_TABLET | ORAL | Status: DC | PRN

## 2015-06-13 MED ORDER — TIZANIDINE HCL 4 MG PO TABS: 4.0000 mg | ORAL_TABLET | Freq: Four times a day (QID) | ORAL | Status: DC | PRN

## 2015-06-13 MED ORDER — PHENTERMINE HCL 37.5 MG PO TABS: 37.5000 mg | ORAL_TABLET | Freq: Every day | ORAL | Status: DC

## 2015-06-13 MED ORDER — ALPRAZOLAM 1 MG PO TABS: ORAL_TABLET | ORAL | Status: DC

## 2015-06-13 NOTE — Progress Notes (Signed)
Subjective:  Presents for follow up. Could not afford other weight loss meds. Would like to try Phentermine again. Takes Xanax only for sleep. Also takes Tylenol PM. Needs refills. Has bottle with her. Refills have expired. Requesting a Rx for pain med for short term use. Was doing well until recently. Has been lifting and pulling her husband since his surgery. Some fatigue. No recent weight gain.   Objective:   BP 132/82 mmHg  Ht 5' 9.5" (1.765 m)  Wt 276 lb (125.193 kg)  BMI 40.19 kg/m2 NAD. Alert, oriented. Lungs clear. Heart RRR.   Assessment:  Problem List Items Addressed This Visit      Cardiovascular and Mediastinum   Benign essential hypertension - Primary   Relevant Orders   Basic metabolic panel     Other   Morbid obesity ([place])   Relevant Medications   phentermine (ADIPEX-P) 37.5 MG tablet    Other Visit Diagnoses    Other fatigue        Relevant Orders    CBC with Differential/Platelet    Hepatic function panel    TSH    Hemoglobin A1c    VITAMIN D 25 Hydroxy (Vit-D Deficiency, Fractures)    Insulin, Fasting    Insomnia        Screening cholesterol level        Relevant Orders    Lipid panel      Plan:  Meds ordered this encounter  Medications  . tiZANidine (ZANAFLEX) 4 MG tablet    Sig: Take 1 tablet (4 mg total) by mouth every 6 (six) hours as needed for muscle spasms.    Dispense:  30 tablet    Refill:  0    Order Specific Question:  Supervising Provider    Answer:  Mikey Kirschner [[id number]]  . phentermine (ADIPEX-P) 37.5 MG tablet    Sig: Take 1 tablet (37.5 mg total) by mouth daily before breakfast.    Dispense:  30 tablet    Refill:  2    [facility] [place]    Order Specific Question:  Supervising Provider    Answer:  Mikey Kirschner [[id number]]  . ALPRAZolam (XANAX) 1 MG tablet    Sig: TAKE ONE-HALF TO ONE TABLET BY MOUTH TWICE DAILY AS NEEDED FOR ANXIETY    Dispense:  30 tablet    Refill:  2    [facility] [place]    Order Specific Question:   Supervising Provider    Answer:  Mikey Kirschner [[id number]]  . HYDROcodone-acetaminophen (NORCO/VICODIN) 5-325 MG tablet    Sig: Take 1 tablet by mouth every 4 (four) hours as needed.    Dispense:  30 tablet    Refill:  0    Order Specific Question:  Supervising Provider    Answer:  Mikey Kirschner [[id number]]   Given one Rx for pain med. Use sparingly and not to be taken with Xanax. Discussed healthy diet and activity. Also one more Rx for phentermine.  Return in about 6 months (around 12/13/2015) for recheck.

## 2015-06-20 LAB — LIPID PANEL
Chol/HDL Ratio: 4.4 ratio units (ref 0.0–4.4)
Cholesterol, Total: 208 mg/dL — ABNORMAL HIGH (ref 100–199)
HDL: 47 mg/dL (ref >39)
LDL Calculated: 118 mg/dL — ABNORMAL HIGH (ref 0–99)
Triglycerides: 216 mg/dL — ABNORMAL HIGH (ref 0–149)
VLDL Cholesterol Cal: 43 mg/dL — ABNORMAL HIGH (ref 5–40)

## 2015-06-20 LAB — BASIC METABOLIC PANEL
BUN/Creatinine Ratio: 15 (ref 9–23)
BUN: 10 mg/dL (ref 6–24)
CO2: 22 mmol/L (ref 18–29)
Calcium: 9.1 mg/dL (ref 8.7–10.2)
Chloride: 99 mmol/L (ref 96–106)
Creatinine, Ser: 0.66 mg/dL (ref 0.57–1.00)
GFR calc Af Amer: 128 mL/min/{1.73_m2} (ref >59)
GFR calc non Af Amer: 111 mL/min/{1.73_m2} (ref >59)
Glucose: 96 mg/dL (ref 65–99)
Potassium: 4 mmol/L (ref 3.5–5.2)
Sodium: 139 mmol/L (ref 134–144)

## 2015-06-20 LAB — CBC WITH DIFFERENTIAL/PLATELET
Basophils Absolute: 0.1 10*3/uL (ref 0.0–0.2)
Basos: 1 %
EOS (ABSOLUTE): 0.3 10*3/uL (ref 0.0–0.4)
Eos: 4 %
Hematocrit: 45.8 % (ref 34.0–46.6)
Hemoglobin: 15.1 g/dL (ref 11.1–15.9)
Immature Grans (Abs): 0 10*3/uL (ref 0.0–0.1)
Immature Granulocytes: 0 %
Lymphocytes Absolute: 2.9 10*3/uL (ref 0.7–3.1)
Lymphs: 32 %
MCH: 31 pg (ref 26.6–33.0)
MCHC: 33 g/dL (ref 31.5–35.7)
MCV: 94 fL (ref 79–97)
Monocytes Absolute: 0.5 10*3/uL (ref 0.1–0.9)
Monocytes: 5 %
Neutrophils Absolute: 5.4 10*3/uL (ref 1.4–7.0)
Neutrophils: 58 %
Platelets: 325 10*3/uL (ref 150–379)
RBC: 4.87 x10E6/uL (ref 3.77–5.28)
RDW: 13.4 % (ref 12.3–15.4)
WBC: 9.2 10*3/uL (ref 3.4–10.8)

## 2015-06-20 LAB — HEPATIC FUNCTION PANEL
ALT: 26 IU/L (ref 0–32)
AST: 18 IU/L (ref 0–40)
Albumin: 4.1 g/dL (ref 3.5–5.5)
Alkaline Phosphatase: 63 IU/L (ref 39–117)
Bilirubin Total: 0.4 mg/dL (ref 0.0–1.2)
Bilirubin, Direct: 0.1 mg/dL (ref 0.00–0.40)
Total Protein: 6.7 g/dL (ref 6.0–8.5)

## 2015-06-20 LAB — HEMOGLOBIN A1C
Est. average glucose Bld gHb Est-mCnc: 105 mg/dL
Hgb A1c MFr Bld: 5.3 % (ref 4.8–5.6)

## 2015-06-20 LAB — INSULIN, RANDOM: INSULIN: 29.2 u[IU]/mL — ABNORMAL HIGH (ref 2.6–24.9)

## 2015-06-20 LAB — VITAMIN D 25 HYDROXY (VIT D DEFICIENCY, FRACTURES): Vit D, 25-Hydroxy: 28.5 ng/mL — ABNORMAL LOW (ref 30.0–100.0)

## 2015-06-20 LAB — TSH: TSH: 1.86 u[IU]/mL (ref 0.450–4.500)

## 2015-07-21 ENCOUNTER — Other Ambulatory Visit: Payer: 59 | Admitting: Adult Health

## 2015-08-02 ENCOUNTER — Other Ambulatory Visit (HOSPITAL_COMMUNITY)
Admission: RE | Admit: 2015-08-02 | Discharge: 2015-08-02 | Disposition: A | Discharge status: Home / Self Care | Payer: 59 | Source: Ambulatory Visit | Attending: Adult Health | Admitting: Adult Health

## 2015-08-02 ENCOUNTER — Ambulatory Visit (INDEPENDENT_AMBULATORY_CARE_PROVIDER_SITE_OTHER): Payer: 59 | Admitting: Adult Health

## 2015-08-02 ENCOUNTER — Encounter: Payer: Self-pay | Admitting: Adult Health

## 2015-08-02 VITALS — BP 150/100 | HR 94 | Ht 67.5 in | Wt 273.0 lb

## 2015-08-02 DIAGNOSIS — N63 Unspecified lump in unspecified breast: Secondary | ICD-10-CM

## 2015-08-02 DIAGNOSIS — Z1212 Encounter for screening for malignant neoplasm of rectum: Secondary | ICD-10-CM

## 2015-08-02 DIAGNOSIS — Z113 Encounter for screening for infections with a predominantly sexual mode of transmission: Secondary | ICD-10-CM | POA: Diagnosis present

## 2015-08-02 DIAGNOSIS — Z01419 Encounter for gynecological examination (general) (routine) without abnormal findings: Secondary | ICD-10-CM | POA: Diagnosis present

## 2015-08-02 DIAGNOSIS — Z1151 Encounter for screening for human papillomavirus (HPV): Secondary | ICD-10-CM | POA: Diagnosis present

## 2015-08-02 DIAGNOSIS — I10 Essential (primary) hypertension: Secondary | ICD-10-CM

## 2015-08-02 HISTORY — DX: Unspecified lump in unspecified breast: N63.0

## 2015-08-02 LAB — HEMOCCULT GUIAC POC 1CARD (OFFICE): Fecal Occult Blood, POC: NEGATIVE

## 2015-08-02 MED ORDER — HYDROCHLOROTHIAZIDE 25 MG PO TABS: 25.0000 mg | ORAL_TABLET | Freq: Every day | ORAL | Status: DC

## 2015-08-02 NOTE — Progress Notes (Signed)
Patient ID: Carol Sharp, female   DOB: [birth year], 40 y.o.   MRN: [id number] History of Present Illness: Carol Sharp is a 40 year old white female, married in for a well woman gyn exam and pap.He rlast was 2010 she says. She is sp ablation and has vaginal discharge with odor at times,may be brown or clear or white.She is sluggish at times, and hair is thinner,and it is harder to lose weight.She had labs with PCP in April.  PCP is Ledon Snare, FNP.   Current Medications, Allergies, Past Medical History, Past Surgical History, Family History and Social History were reviewed in [employer] record.     Review of Systems: Patient denies any headaches, hearing loss, blurred vision, shortness of breath, chest pain, abdominal pain, problems with bowel movements, urination, or intercourse. No mood swings, has pain in shoulder at times, on meds..See HPI for positives.    Physical Exam:BP 150/100 mmHg  Pulse 94  Ht 5' 7.5" (1.715 m)  Wt 273 lb (123.832 kg)  BMI 42.10 kg/m2 General:  Well developed, well nourished, no acute distress Skin:  Warm and dry,tan and has several tattoos Neck:  Midline trachea, normal thyroid, good ROM, no lymphadenopathy Lungs; Clear to auscultation bilaterally Breast:  No dominant palpable mass, retraction, or nipple discharge on left, on right no nipple discharge or retraction but has nodule at 11 o'clock that is tender and surrounded by irregularities. Cardiovascular: Regular rate and rhythm Abdomen:  Soft, non tender, no hepatosplenomegaly Pelvic:  External genitalia is normal in appearance, no lesions.  The vagina is normal in appearance. Urethra has no lesions or masses. The cervix is bulbous. Pap with HPV and GC/CHL performed. Uterus is felt to be normal size, shape, and contour.  No adnexal masses or tenderness noted.Bladder is non tender, no masses felt. Rectal: Good sphincter tone, no polyps, or hemorrhoids felt.  Hemoccult  negative. Extremities/musculoskeletal:  No swelling or varicosities noted, no clubbing or cyanosis Psych:  No mood changes, alert and cooperative,seems happy Reviewed labs with her that was done at PCP.   Impression: Well woman gyn exam and pap Hypertension Breast nodule     Plan: Scheduled bilateral mammogram and [place] at [facility] 6/27 at 11:30 am Rx hydrodiuril 25 mg #30 take 1 daily with 11 refills Follow up in 4 weeks for BP check  Physical in 1 year, pap in 3 if normal Decrease salt and sugar and caffeine Continue to try to lose some weight

## 2015-08-02 NOTE — Patient Instructions (Signed)
Get mammogram at [facility] 6/27 at 11:30  Recheck BP in 4 weeks Physical in 1 year Pap in 3 years if normal Take BP meds, decrease salt and sugars

## 2015-08-04 ENCOUNTER — Telehealth: Payer: Self-pay | Admitting: Adult Health

## 2015-08-04 LAB — CYTOLOGY - PAP

## 2015-08-04 MED ORDER — FLUCONAZOLE 150 MG PO TABS: 150.0000 mg | ORAL_TABLET | Freq: Once | ORAL | Status: DC

## 2015-08-04 NOTE — Telephone Encounter (Signed)
Pt aware pap normal with negative HPV but + yeast, will rx diflucan

## 2015-08-11 ENCOUNTER — Telehealth: Payer: Self-pay | Admitting: *Deleted

## 2015-08-11 NOTE — Telephone Encounter (Signed)
Mom called wants insurance checked for iUD too

## 2015-08-16 ENCOUNTER — Ambulatory Visit (HOSPITAL_COMMUNITY)
Admission: RE | Admit: 2015-08-16 | Discharge: 2015-08-16 | Disposition: A | Discharge status: Home / Self Care | Payer: 59 | Source: Ambulatory Visit | Attending: Adult Health | Admitting: Adult Health

## 2015-08-16 ENCOUNTER — Encounter (HOSPITAL_COMMUNITY): Payer: 59

## 2015-08-16 DIAGNOSIS — N63 Unspecified lump in unspecified breast: Secondary | ICD-10-CM

## 2015-08-30 ENCOUNTER — Ambulatory Visit: Payer: 59 | Admitting: Adult Health

## 2015-10-14 ENCOUNTER — Telehealth: Payer: Self-pay | Admitting: Family Medicine

## 2015-10-14 MED ORDER — FLUCONAZOLE 200 MG PO TABS: ORAL_TABLET | ORAL | 0 refills | Status: DC

## 2015-10-14 NOTE — Telephone Encounter (Signed)
Diflucan 200 mg 1 daily for 7 days. Patient may also use Monistat cream apply on that area once or twice daily, follow-up if ongoing troubles

## 2015-10-14 NOTE — Telephone Encounter (Signed)
Pt has external yeast infection under her "fat roll" in the bikini line area - first noticed last night after her shower  Leaving tomorrow evening for a week long trip  Would like Diflucan called in.    Please advise    [facility]

## 2015-10-14 NOTE — Telephone Encounter (Signed)
Spoke with patient and informed her per Dr.Scott Luking- Diflucan 200 mg 1 daily for 7 days. Patient may also use Monistat cream apply on that area once or twice daily, follow-up if ongoing troubles. Patient verbalized understanding.

## 2015-12-08 ENCOUNTER — Encounter: Payer: Self-pay | Admitting: Nurse Practitioner

## 2015-12-08 ENCOUNTER — Ambulatory Visit (INDEPENDENT_AMBULATORY_CARE_PROVIDER_SITE_OTHER): Payer: 59 | Admitting: Nurse Practitioner

## 2015-12-08 ENCOUNTER — Other Ambulatory Visit: Payer: Self-pay | Admitting: Nurse Practitioner

## 2015-12-08 VITALS — BP 140/98 | Ht 69.0 in | Wt 259.0 lb

## 2015-12-08 DIAGNOSIS — F419 Anxiety disorder, unspecified: Secondary | ICD-10-CM

## 2015-12-08 MED ORDER — ALPRAZOLAM 1 MG PO TABS: ORAL_TABLET | ORAL | 2 refills | Status: DC

## 2015-12-08 MED ORDER — DULOXETINE HCL 30 MG PO CPEP: 30.0000 mg | ORAL_CAPSULE | Freq: Every day | ORAL | 2 refills | Status: DC

## 2015-12-09 ENCOUNTER — Encounter: Payer: Self-pay | Admitting: Nurse Practitioner

## 2015-12-09 DIAGNOSIS — F419 Anxiety disorder, unspecified: Secondary | ICD-10-CM | POA: Insufficient documentation

## 2015-12-09 NOTE — Progress Notes (Signed)
Subjective:  Presents to discuss her anxiety. Has been under tremendous stress due to family and personal issues. Difficulty sleeping. Emotional lability. Crying all the time. Denies suicidal or homicidal thoughts or ideation. Patient ran out of her medications a few months ago. Would like to try Cymbalta again since that seemed to work the best.  Objective:   BP (!) 140/98   Ht 5\' 9"  (1.753 m)   Wt 259 lb (117.5 kg)   BMI 38.25 kg/m  NAD. Alert, oriented. Thoughts logical coherent and relevant. Dressed appropriately. Good eye contact. Crying at times during office visit. Lungs clear. Heart regular rate rhythm.  Assessment:  Problem List Items Addressed This Visit      Other   Anxiety - Primary   Relevant Medications   DULoxetine (CYMBALTA) 30 MG capsule   ALPRAZolam (XANAX) 1 MG tablet    Other Visit Diagnoses   None.    Plan:  Meds ordered this encounter  Medications  . DULoxetine (CYMBALTA) 30 MG capsule    Sig: Take 1 capsule (30 mg total) by mouth daily.    Dispense:  30 capsule    Refill:  2    Order Specific Question:   Supervising Provider    Answer:   Mikey Kirschner [[id number]]  . ALPRAZolam (XANAX) 1 MG tablet    Sig: TAKE ONE-HALF TO ONE TABLET BY MOUTH TWICE DAILY AS NEEDED FOR ANXIETY    Dispense:  30 tablet    Refill:  2    [facility] [place]    Order Specific Question:   Supervising Provider    Answer:   Mikey Kirschner [[id number]]    Resume Cymbalta and Xanax. Consider mental health counseling if family issues continue. Also in a few weeks call back if she wishes to increase the dose of Cymbalta. Return in about 3 months (around 03/09/2016) for recheck.

## 2016-02-10 ENCOUNTER — Telehealth: Payer: Self-pay | Admitting: Nurse Practitioner

## 2016-02-10 NOTE — Telephone Encounter (Signed)
Patient advised that Carol Sharp says that Yes she can take 2 at the same time (total dose 60 mg). If this works let her know so she can call in new Rx. Patient verbalized understanding.

## 2016-02-10 NOTE — Telephone Encounter (Signed)
Yes she can take 2 at the same time (total dose 60 mg). If this works let me know so I can call in new Rx

## 2016-02-10 NOTE — Telephone Encounter (Signed)
Left message to return call 

## 2016-02-10 NOTE — Telephone Encounter (Signed)
Patient wants to know if she can take another Cymbalta?  She has already taken one today, but feels she needs to take another.

## 2016-03-15 ENCOUNTER — Encounter: Payer: Self-pay | Admitting: Nurse Practitioner

## 2016-03-15 ENCOUNTER — Other Ambulatory Visit: Payer: Self-pay | Admitting: Nurse Practitioner

## 2016-03-15 MED ORDER — DULOXETINE HCL 30 MG PO CPEP: 30.0000 mg | ORAL_CAPSULE | Freq: Every day | ORAL | 2 refills | Status: DC

## 2016-03-15 MED ORDER — ALPRAZOLAM 1 MG PO TABS: ORAL_TABLET | ORAL | 2 refills | Status: DC

## 2016-06-05 ENCOUNTER — Encounter: Payer: Self-pay | Admitting: Family Medicine

## 2016-06-05 ENCOUNTER — Ambulatory Visit (INDEPENDENT_AMBULATORY_CARE_PROVIDER_SITE_OTHER): Payer: 59 | Admitting: Family Medicine

## 2016-06-05 VITALS — BP 118/86 | Temp 98.1°F | Ht 69.5 in | Wt 274.0 lb

## 2016-06-05 DIAGNOSIS — J019 Acute sinusitis, unspecified: Secondary | ICD-10-CM

## 2016-06-05 MED ORDER — FLUCONAZOLE 150 MG PO TABS: 150.0000 mg | ORAL_TABLET | Freq: Once | ORAL | 4 refills | Status: DC

## 2016-06-05 MED ORDER — FLUTICASONE PROPIONATE 50 MCG/ACT NA SUSP: 2.0000 | Freq: Every day | NASAL | 5 refills | Status: DC

## 2016-06-05 MED ORDER — HYDROCODONE-HOMATROPINE 5-1.5 MG/5ML PO SYRP: ORAL_SOLUTION | ORAL | 0 refills | Status: DC

## 2016-06-05 MED ORDER — LEVOFLOXACIN 500 MG PO TABS: 500.0000 mg | ORAL_TABLET | Freq: Every day | ORAL | 0 refills | Status: DC

## 2016-06-05 NOTE — Progress Notes (Signed)
   Subjective:    Patient ID: Carol Sharp, female    DOB: 1975-09-20, 41 y.o.   MRN: [id number]  Cough  This is a new problem. The current episode started in the past 7 days. The problem has been rapidly worsening. The problem occurs constantly. Associated symptoms include headaches, rhinorrhea and a sore throat. Pertinent negatives include no chest pain, ear pain, fever, shortness of breath or wheezing. Associated symptoms comments: BROWN MUCUS. She has tried OTC cough suppressant for the symptoms. The treatment provided no relief.  Diarrhea Runny nose    Review of Systems  Constitutional: Negative for activity change and fever.  HENT: Positive for congestion, rhinorrhea and sore throat. Negative for ear pain.   Eyes: Negative for discharge.  Respiratory: Positive for cough. Negative for shortness of breath and wheezing.   Cardiovascular: Negative for chest pain.  Neurological: Positive for headaches.       Objective:   Physical Exam  Constitutional: She appears well-developed.  HENT:  Head: Normocephalic.  Nose: Nose normal.  Mouth/Throat: Oropharynx is clear and moist. No oropharyngeal exudate.  Neck: Neck supple.  Cardiovascular: Normal rate and normal heart sounds.   No murmur heard. Pulmonary/Chest: Effort normal and breath sounds normal. She has no wheezes.  Lymphadenopathy:    She has no cervical adenopathy.  Skin: Skin is warm and dry.  Nursing note and vitals reviewed.   Patient does not appear toxic. She does have tenderness in the sinuses.      Assessment & Plan:   Viral syndrome Secondary rhinosinusitis Antibiotics prescribed warning signs discussed follow-up if problems Cough medication for home use only If progressively worse follow-up

## 2016-06-23 ENCOUNTER — Other Ambulatory Visit: Payer: Self-pay | Admitting: Nurse Practitioner

## 2016-07-06 ENCOUNTER — Telehealth: Payer: Self-pay | Admitting: Nurse Practitioner

## 2016-07-06 ENCOUNTER — Other Ambulatory Visit: Payer: Self-pay | Admitting: Nurse Practitioner

## 2016-07-06 MED ORDER — DULOXETINE HCL 30 MG PO CPEP: 30.0000 mg | ORAL_CAPSULE | Freq: Every day | ORAL | 0 refills | Status: DC

## 2016-07-06 NOTE — Telephone Encounter (Signed)
Pt advised refill was sent in and she needs to schedule an appointment.

## 2016-07-06 NOTE — Telephone Encounter (Signed)
Sent in another refill so it gives her time to set up office visit

## 2016-07-06 NOTE — Telephone Encounter (Signed)
Pt called stating that she doesn't have any Cymbalta and is wanting to know if she needs to be seen to get a refill.

## 2016-07-06 NOTE — Telephone Encounter (Signed)
Left message return call 07/06/16

## 2016-07-24 ENCOUNTER — Encounter: Payer: Self-pay | Admitting: Nurse Practitioner

## 2016-07-24 ENCOUNTER — Ambulatory Visit (INDEPENDENT_AMBULATORY_CARE_PROVIDER_SITE_OTHER): Payer: 59 | Admitting: Nurse Practitioner

## 2016-07-24 VITALS — BP 138/90 | Temp 98.3°F | Ht 69.5 in | Wt 280.0 lb

## 2016-07-24 DIAGNOSIS — J329 Chronic sinusitis, unspecified: Secondary | ICD-10-CM

## 2016-07-24 DIAGNOSIS — J310 Chronic rhinitis: Secondary | ICD-10-CM

## 2016-07-24 MED ORDER — HYDROCHLOROTHIAZIDE 25 MG PO TABS: 25.0000 mg | ORAL_TABLET | Freq: Every day | ORAL | 11 refills | Status: DC

## 2016-07-24 MED ORDER — HYDROCODONE-HOMATROPINE 5-1.5 MG/5ML PO SYRP: ORAL_SOLUTION | ORAL | 0 refills | Status: DC

## 2016-07-24 MED ORDER — AZITHROMYCIN 250 MG PO TABS: ORAL_TABLET | ORAL | 0 refills | Status: DC

## 2016-07-26 ENCOUNTER — Encounter: Payer: Self-pay | Admitting: Nurse Practitioner

## 2016-07-26 NOTE — Progress Notes (Signed)
Subjective:  Presents for complaints of a nonproductive cough for the past 11 days. No fever. Sore throat. Headache. Runny nose. Frequent nonproductive cough. Producing slight green mucus. No ear pain. No wheezing. No acid reflux heartburn or abdominal pain. Has been taking Allegra-D for sinus pressure. Also note she's been out of her blood pressure meds for the past 2 days. Has cut back on her smoking, smokes one third to one half pack per day.  Objective:   BP 138/90   Temp 98.3 F (36.8 C) (Oral)   Ht 5' 9.5" (1.765 m)   Wt 280 lb (127 kg)   BMI 40.76 kg/m  NAD. Alert, oriented. TMs clear effusion, no erythema. Pharynx injected with green PND noted. Neck supple with mild soft anterior adenopathy. Lungs clear. Heart regular rate rhythm. No wheezing or tachypnea.  Assessment:  Rhinosinusitis    Plan:   Meds ordered this encounter  Medications  . hydrochlorothiazide (HYDRODIURIL) 25 MG tablet    Sig: Take 1 tablet (25 mg total) by mouth daily.    Dispense:  30 tablet    Refill:  11    Order Specific Question:   Supervising Provider    Answer:   Mikey Kirschner [[id number]]  . azithromycin (ZITHROMAX Z-PAK) 250 MG tablet    Sig: Take 2 tablets (500 mg) on  Day 1,  followed by 1 tablet (250 mg) once daily on Days 2 through 5.    Dispense:  6 each    Refill:  0    Order Specific Question:   Supervising Provider    Answer:   Mikey Kirschner [[id number]]  . HYDROcodone-homatropine (HYCODAN) 5-1.5 MG/5ML syrup    Sig: One tsp po q 4 hrs prn night time cough    Dispense:  90 mL    Refill:  0    Order Specific Question:   Supervising Provider    Answer:   Mikey Kirschner [[id number]]   Use Allegra-D sparingly due to blood pressure. OTC meds as directed. Warning signs reviewed. Call back by the end of the week if no improvement, sooner if worse. Otherwise routine follow-up for blood pressure.

## 2016-08-28 ENCOUNTER — Telehealth: Payer: Self-pay | Admitting: Family Medicine

## 2016-08-28 ENCOUNTER — Other Ambulatory Visit: Payer: Self-pay | Admitting: Family Medicine

## 2016-08-28 MED ORDER — DULOXETINE HCL 30 MG PO CPEP: 30.0000 mg | ORAL_CAPSULE | Freq: Every day | ORAL | 5 refills | Status: DC

## 2016-08-28 MED ORDER — ALPRAZOLAM 1 MG PO TABS: ORAL_TABLET | ORAL | 2 refills | Status: DC

## 2016-08-28 NOTE — Telephone Encounter (Signed)
Last seen for anxiety oct 2017

## 2016-08-28 NOTE — Telephone Encounter (Signed)
I did give refills of both Xanax and Cymbalta. I instructed the patient to do a medicine follow-up by November or December/January at the latest

## 2016-08-28 NOTE — Telephone Encounter (Signed)
Requesting refill on Cymbalta and Alprazolam to her new pharmacy, [facility].

## 2016-10-03 ENCOUNTER — Telehealth: Payer: Self-pay | Admitting: Nurse Practitioner

## 2016-10-03 NOTE — Telephone Encounter (Signed)
Pt is requesting a call from carolyn on some personal issues.

## 2016-11-30 ENCOUNTER — Other Ambulatory Visit: Payer: Self-pay | Admitting: Family Medicine

## 2016-11-30 NOTE — Telephone Encounter (Signed)
[place] to see 

## 2016-12-02 NOTE — Telephone Encounter (Signed)
Patient may have a refill for 20 tablets needs office visit

## 2016-12-11 ENCOUNTER — Ambulatory Visit (INDEPENDENT_AMBULATORY_CARE_PROVIDER_SITE_OTHER): Payer: 59 | Admitting: Family Medicine

## 2016-12-11 ENCOUNTER — Encounter: Payer: Self-pay | Admitting: Family Medicine

## 2016-12-11 VITALS — BP 130/90 | Ht 69.5 in | Wt 287.5 lb

## 2016-12-11 DIAGNOSIS — R5383 Other fatigue: Secondary | ICD-10-CM | POA: Diagnosis not present

## 2016-12-11 DIAGNOSIS — I10 Essential (primary) hypertension: Secondary | ICD-10-CM

## 2016-12-11 DIAGNOSIS — M25561 Pain in right knee: Secondary | ICD-10-CM | POA: Diagnosis not present

## 2016-12-11 DIAGNOSIS — N3281 Overactive bladder: Secondary | ICD-10-CM

## 2016-12-11 MED ORDER — DULOXETINE HCL 30 MG PO CPEP: 30.0000 mg | ORAL_CAPSULE | Freq: Every day | ORAL | 5 refills | Status: DC

## 2016-12-11 MED ORDER — HYDROCODONE-ACETAMINOPHEN 5-325 MG PO TABS: 1.0000 | ORAL_TABLET | ORAL | 0 refills | Status: DC | PRN

## 2016-12-11 MED ORDER — ALPRAZOLAM 1 MG PO TABS: ORAL_TABLET | ORAL | 5 refills | Status: DC

## 2016-12-11 MED ORDER — OXYBUTYNIN CHLORIDE ER 5 MG PO TB24: 5.0000 mg | ORAL_TABLET | Freq: Every day | ORAL | 5 refills | Status: DC

## 2016-12-11 MED ORDER — AMLODIPINE BESYLATE 5 MG PO TABS
5.0000 mg | ORAL_TABLET | Freq: Every day | ORAL | 11 refills | Status: DC
Start: 2016-12-11 — End: 2017-12-25

## 2016-12-11 NOTE — Progress Notes (Signed)
   Subjective:    Patient ID: Carol Sharp, female    DOB: Apr 12, 1975, 41 y.o.   MRN: [id number]  Hypertension  This is a chronic problem. The current episode started more than 1 year ago. Pertinent negatives include no chest pain, headaches or shortness of breath.   fatigue relates fatigue tiredness feeling rundown a lot does snore at night states she gets proper sleep denies being depressed but is stressed at times  Right knee pain has intermittent right knee pain some intermittent swelling but does not give way or lock  Concerned about weight patient relates her weight is going up she is trying to eat healthier although she does snack some when she's under stress she does not exercise a lot because of knee pain  Patient has concerns of weight and knee pain. She is concerned that her weight is gone up dramatically she wonders if there is any way to help bring it down   Review of Systems  Constitutional: Negative for activity change, fatigue and fever.  HENT: Negative for congestion.   Respiratory: Negative for cough, chest tightness and shortness of breath.   Cardiovascular: Negative for chest pain and leg swelling.  Gastrointestinal: Negative for abdominal pain.  Skin: Negative for color change.  Neurological: Negative for headaches.  Psychiatric/Behavioral: Negative for behavioral problems.       Objective:   Physical Exam  Constitutional: She appears well-developed and well-nourished. No distress.  HENT:  Head: Normocephalic and atraumatic.  Eyes: Right eye exhibits no discharge. Left eye exhibits no discharge.  Neck: No tracheal deviation present.  Cardiovascular: Normal rate, regular rhythm and normal heart sounds.   No murmur heard. Pulmonary/Chest: Effort normal and breath sounds normal. No respiratory distress. She has no wheezes. She has no rales.  Musculoskeletal: She exhibits no edema.  Lymphadenopathy:    She has no cervical adenopathy.  Neurological: She is  alert. She exhibits normal muscle tone.  Skin: Skin is warm and dry. No erythema.  Psychiatric: Her behavior is normal.  Vitals reviewed.         Assessment & Plan:  HTN good control continue current measures Overactive bladder check urine also Ditropan as directed Significant fatigue tiredness probably related to obesity and possible sleep apnea- questionnaire also thyroid function and kidney function plus also CBC Knee pain on the right side related to obesity anti-inflammatory when necessary no need for any type of MRI or surgery Obesity counseled patient regarding healthy choices regular physical activity I also feel she would be a good candidate for weight loss surgery but unfortunately her work insurance does not cover this  Follow-up 6 months

## 2016-12-14 DIAGNOSIS — I10 Essential (primary) hypertension: Secondary | ICD-10-CM | POA: Diagnosis not present

## 2016-12-14 DIAGNOSIS — R5383 Other fatigue: Secondary | ICD-10-CM | POA: Diagnosis not present

## 2016-12-15 ENCOUNTER — Encounter: Payer: Self-pay | Admitting: Family Medicine

## 2016-12-15 LAB — BASIC METABOLIC PANEL
BUN/Creatinine Ratio: 17 (ref 9–23)
BUN: 13 mg/dL (ref 6–24)
CO2: 21 mmol/L (ref 20–29)
Calcium: 9.6 mg/dL (ref 8.7–10.2)
Chloride: 103 mmol/L (ref 96–106)
Creatinine, Ser: 0.75 mg/dL (ref 0.57–1.00)
GFR calc Af Amer: 114 mL/min/{1.73_m2} (ref >59)
GFR calc non Af Amer: 99 mL/min/{1.73_m2} (ref >59)
Glucose: 93 mg/dL (ref 65–99)
Potassium: 4.1 mmol/L (ref 3.5–5.2)
Sodium: 141 mmol/L (ref 134–144)

## 2016-12-15 LAB — CBC WITH DIFFERENTIAL/PLATELET
Basophils Absolute: 0.1 10*3/uL (ref 0.0–0.2)
Basos: 1 %
EOS (ABSOLUTE): 0.3 10*3/uL (ref 0.0–0.4)
Eos: 3 %
Hematocrit: 45.4 % (ref 34.0–46.6)
Hemoglobin: 15.6 g/dL (ref 11.1–15.9)
Immature Grans (Abs): 0 10*3/uL (ref 0.0–0.1)
Immature Granulocytes: 0 %
Lymphocytes Absolute: 2.8 10*3/uL (ref 0.7–3.1)
Lymphs: 31 %
MCH: 32 pg (ref 26.6–33.0)
MCHC: 34.4 g/dL (ref 31.5–35.7)
MCV: 93 fL (ref 79–97)
Monocytes Absolute: 0.4 10*3/uL (ref 0.1–0.9)
Monocytes: 4 %
Neutrophils Absolute: 5.5 10*3/uL (ref 1.4–7.0)
Neutrophils: 61 %
Platelets: 377 10*3/uL (ref 150–379)
RBC: 4.87 x10E6/uL (ref 3.77–5.28)
RDW: 13.5 % (ref 12.3–15.4)
WBC: 9.1 10*3/uL (ref 3.4–10.8)

## 2016-12-15 LAB — TSH: TSH: 1.62 u[IU]/mL (ref 0.450–4.500)

## 2016-12-15 LAB — URINALYSIS
Bilirubin, UA: NEGATIVE
Glucose, UA: NEGATIVE
Leukocytes, UA: NEGATIVE
Nitrite, UA: NEGATIVE
RBC, UA: NEGATIVE
Specific Gravity, UA: 1.025 (ref 1.005–1.030)
Urobilinogen, Ur: 0.2 mg/dL (ref 0.2–1.0)
pH, UA: 6.5 (ref 5.0–7.5)

## 2016-12-15 LAB — HEPATIC FUNCTION PANEL
ALT: 22 IU/L (ref 0–32)
AST: 17 IU/L (ref 0–40)
Albumin: 4.5 g/dL (ref 3.5–5.5)
Alkaline Phosphatase: 66 IU/L (ref 39–117)
Bilirubin Total: 0.5 mg/dL (ref 0.0–1.2)
Bilirubin, Direct: 0.13 mg/dL (ref 0.00–0.40)
Total Protein: 7 g/dL (ref 6.0–8.5)

## 2016-12-15 LAB — T4, FREE: Free T4: 1.34 ng/dL (ref 0.82–1.77)

## 2016-12-15 LAB — LIPID PANEL
Chol/HDL Ratio: 4.5 ratio — ABNORMAL HIGH (ref 0.0–4.4)
Cholesterol, Total: 227 mg/dL — ABNORMAL HIGH (ref 100–199)
HDL: 50 mg/dL (ref >39)
LDL Calculated: 146 mg/dL — ABNORMAL HIGH (ref 0–99)
Triglycerides: 156 mg/dL — ABNORMAL HIGH (ref 0–149)
VLDL Cholesterol Cal: 31 mg/dL (ref 5–40)

## 2017-01-16 ENCOUNTER — Encounter: Payer: Self-pay | Admitting: Family Medicine

## 2017-01-16 ENCOUNTER — Ambulatory Visit: Payer: 59 | Admitting: Family Medicine

## 2017-01-16 VITALS — BP 146/104 | Temp 98.1°F | Ht 69.5 in | Wt 281.0 lb

## 2017-01-16 DIAGNOSIS — R030 Elevated blood-pressure reading, without diagnosis of hypertension: Secondary | ICD-10-CM | POA: Diagnosis not present

## 2017-01-16 DIAGNOSIS — B349 Viral infection, unspecified: Secondary | ICD-10-CM

## 2017-01-16 DIAGNOSIS — J019 Acute sinusitis, unspecified: Secondary | ICD-10-CM | POA: Diagnosis not present

## 2017-01-16 MED ORDER — FLUCONAZOLE 150 MG PO TABS: 150.0000 mg | ORAL_TABLET | Freq: Once | ORAL | 4 refills | Status: AC

## 2017-01-16 MED ORDER — HYDROCODONE-ACETAMINOPHEN 5-325 MG PO TABS: 1.0000 | ORAL_TABLET | ORAL | 0 refills | Status: DC | PRN

## 2017-01-16 MED ORDER — AMOXICILLIN-POT CLAVULANATE 875-125 MG PO TABS: 1.0000 | ORAL_TABLET | Freq: Two times a day (BID) | ORAL | 0 refills | Status: DC

## 2017-01-16 NOTE — Progress Notes (Addendum)
   Subjective:    Patient ID: Carol Sharp, female    DOB: [birth year], 41 y.o.   MRN: [id number]  HPI  Patient is here today with complaints of cough,headache,runny nose, Bilateral ear pressure for a week now. She has taken Tylenol, OTC cough medication which have helped some. Viral-like illness several days head congestion drainage body aches not feeling good but now with sinus pressure pain discomfort no vomiting or diarrhea Review of Systems  Constitutional: Negative for activity change and fever.  HENT: Positive for congestion and rhinorrhea. Negative for ear pain.   Eyes: Negative for discharge.  Respiratory: Positive for cough. Negative for shortness of breath and wheezing.   Cardiovascular: Negative for chest pain.       Objective:   Physical Exam  Constitutional: She appears well-developed.  HENT:  Head: Normocephalic.  Right Ear: External ear normal.  Left Ear: External ear normal.  Nose: Nose normal.  Mouth/Throat: Oropharynx is clear and moist. No oropharyngeal exudate.  Eyes: Right eye exhibits no discharge. Left eye exhibits no discharge.  Neck: Neck supple. No tracheal deviation present.  Cardiovascular: Normal rate and normal heart sounds.  No murmur heard. Pulmonary/Chest: Effort normal and breath sounds normal. She has no wheezes. She has no rales.  Lymphadenopathy:    She has no cervical adenopathy.  Skin: Skin is warm and dry.  Nursing note and vitals reviewed.         Assessment & Plan:  Viral syndrome Secondary rhinosinusitis Some increased coughing but related to postnasal drip X-ray lab work not indicated currently Antibiotics prescribed warning signs discussed follow-up if progressive troubles  Patient has elevated blood pressure I recommend that she follow-up next week for a courtesy blood pressure check patient does have excessive use of over-the-counter cold medications which may be playing a factor may need blood pressure medication  adjustments

## 2017-03-06 ENCOUNTER — Ambulatory Visit (INDEPENDENT_AMBULATORY_CARE_PROVIDER_SITE_OTHER): Payer: 59

## 2017-03-06 ENCOUNTER — Encounter: Payer: Self-pay | Admitting: Orthopedic Surgery

## 2017-03-06 ENCOUNTER — Ambulatory Visit: Payer: 59 | Admitting: Orthopedic Surgery

## 2017-03-06 VITALS — BP 163/101 | HR 101 | Ht 69.5 in | Wt 281.0 lb

## 2017-03-06 DIAGNOSIS — M25561 Pain in right knee: Secondary | ICD-10-CM

## 2017-03-06 DIAGNOSIS — M23203 Derangement of unspecified medial meniscus due to old tear or injury, right knee: Secondary | ICD-10-CM | POA: Diagnosis not present

## 2017-03-06 DIAGNOSIS — G8929 Other chronic pain: Secondary | ICD-10-CM | POA: Diagnosis not present

## 2017-03-06 MED ORDER — NABUMETONE 500 MG PO TABS: 500.0000 mg | ORAL_TABLET | Freq: Two times a day (BID) | ORAL | 5 refills | Status: DC

## 2017-03-06 NOTE — Progress Notes (Signed)
Chief Complaint  Patient presents with  . Knee Pain    right knee painful had injury 2010 pain increased in past month    42 year old female presents for evaluation of her right knee.  She first injured her knee back in 2010 doing some type of knee kick exercises but she was treated and that got better.  Sometime in the late summer early fall she twisted her knee in the yard.  She eventually saw her primary care doctor who started her on a steroid Dosepak followed by ibuprofen ice and rest and she has not improved  She now complains of constant medial dull aching right knee pain which radiates across the front of the knee is associated with inability to fully flex or straighten the knee as well as a feeling that something is in there and she feels a popping sensation in the joint.  Symptoms have worsened over the last month  Review of Systems  Constitutional: Negative for chills, fever and weight loss.  Respiratory: Negative for shortness of breath.   Cardiovascular: Negative for chest pain.  Neurological: Negative for tingling.   Past Medical History:  Diagnosis Date  . Anxiety   . Breast nodule 08/02/2015  . Depression   . Hypertension   . MRSA cellulitis May 2005    BP (!) 163/101   Pulse (!) 101   Ht 5' 9.5" (1.765 m)   Wt 281 lb (127.5 kg)   BMI 40.90 kg/m   Physical Exam  Constitutional: She is oriented to person, place, and time. She appears well-developed and well-nourished.  Musculoskeletal:       Legs: Neurological: She is alert and oriented to person, place, and time. Gait abnormal.  Limping favoring the right leg  Psychiatric: She has a normal mood and affect. Judgment normal.  Vitals reviewed.     Imaging was ordered and read in the office  Advanced arthritis for age medial lateral and patellofemoral compartments Encounter Diagnoses  Name Primary?  . Chronic pain of right knee Yes  . Degenerative tear of medial meniscus of right knee      Probably  has a torn meniscus.  She has met criteria for nonoperative treatment for greater than a month and I am sending her for MRI to rule out torn medial meniscus

## 2017-03-12 ENCOUNTER — Ambulatory Visit (HOSPITAL_COMMUNITY)
Admission: RE | Admit: 2017-03-12 | Discharge: 2017-03-12 | Disposition: A | Discharge status: Home / Self Care | Payer: 59 | Source: Ambulatory Visit | Attending: Orthopedic Surgery | Admitting: Orthopedic Surgery

## 2017-03-12 DIAGNOSIS — G8929 Other chronic pain: Secondary | ICD-10-CM | POA: Insufficient documentation

## 2017-03-12 DIAGNOSIS — M25561 Pain in right knee: Secondary | ICD-10-CM

## 2017-03-12 DIAGNOSIS — M7121 Synovial cyst of popliteal space [Baker], right knee: Secondary | ICD-10-CM | POA: Diagnosis not present

## 2017-03-12 DIAGNOSIS — M23203 Derangement of unspecified medial meniscus due to old tear or injury, right knee: Secondary | ICD-10-CM | POA: Diagnosis not present

## 2017-03-12 DIAGNOSIS — M1711 Unilateral primary osteoarthritis, right knee: Secondary | ICD-10-CM | POA: Insufficient documentation

## 2017-03-18 ENCOUNTER — Telehealth: Payer: Self-pay | Admitting: Family Medicine

## 2017-03-18 NOTE — Telephone Encounter (Signed)
Please let the patient know that I was alerted to the fact that her blood pressure is still been up.  Husband notified us.  I checked her blood pressure reading at Dr. Ruthe Mannan.  The patient does need to be on additional blood pressure medicine.  Lisinopril 5 mg/day.  #31 refill, check metabolic 7 in 2 weeks follow-up office visit in 2-3 weeks to check blood pressure continue all current medications.  Bring all medications with her when she comes.

## 2017-03-18 NOTE — Telephone Encounter (Signed)
BP up

## 2017-03-19 ENCOUNTER — Other Ambulatory Visit: Payer: Self-pay | Admitting: *Deleted

## 2017-03-19 DIAGNOSIS — I10 Essential (primary) hypertension: Secondary | ICD-10-CM

## 2017-03-19 MED ORDER — LISINOPRIL 5 MG PO TABS: 5.0000 mg | ORAL_TABLET | Freq: Every day | ORAL | 0 refills | Status: DC

## 2017-03-19 NOTE — Telephone Encounter (Signed)
Left message to return call 

## 2017-03-19 NOTE — Telephone Encounter (Signed)
Discussed with pt. Pt verbalized understanding. Med sent to pharm and bw orders put in. Pt states she will call back to schedule the follow up.

## 2017-03-20 ENCOUNTER — Encounter: Payer: Self-pay | Admitting: Orthopedic Surgery

## 2017-03-20 ENCOUNTER — Ambulatory Visit: Payer: 59 | Admitting: Orthopedic Surgery

## 2017-03-20 VITALS — BP 140/87 | HR 89 | Ht 69.5 in | Wt 283.0 lb

## 2017-03-20 DIAGNOSIS — M23203 Derangement of unspecified medial meniscus due to old tear or injury, right knee: Secondary | ICD-10-CM | POA: Diagnosis not present

## 2017-03-20 NOTE — Progress Notes (Signed)
Chief Complaint  Patient presents with  . Results    MRI Right Knee    42 year old female with pain in her right knee sent for MRI   MRI shows that she has torn medial meniscus and osteoarthritis   She says her knee is really hurting   Review of systems joint pain joint swelling no fever no chills  Please see previous examination for physical exam findings    MRI report patellofemoral: Thinned and irregular throughout without focal defect.  Medial: Thinned with associated mild to moderate joint space narrowing. No focal defect.  Lateral:  Mildly to moderately degenerated.  Joint:  Small effusion.   IMPRESSION: Large radial tear just peripheral to the root of the posterior horn of the medial meniscus.  Moderate osteoarthritis about the knee appears worst in the patellofemoral and medial compartments.  Very small Baker's cyst.   Electronically Signed   By: Inge Rise M.D.   On: 03/13/2017 08:43   I reviewed the MRI report with the patient she understands she has a torn meniscus and osteoarthritis the osteoarthritis cannot be fixed with surgery she will need further care if she has arthroscopy of the right knee to remove the meniscal tear.  She needs to discuss this with her employer to get a date set up for an arthroscopy of the right knee and a partial medial meniscectomy  The bulk of this visit was discussing the MRI and surgery time spent 15 minutes

## 2017-03-20 NOTE — Patient Instructions (Addendum)
Meniscus Injury, Arthroscopy Arthroscopy is a surgical procedure that involves the use of a small scope that has a camera and surgical instruments on the end (arthroscope). An arthroscope can be used to repair your meniscus injury.  LET YOUR HEALTH CARE PROVIDER KNOW ABOUT:  Any allergies you have.  All medicines you are taking, including vitamins, herbs, eyedrops, creams, and over-the-counter medicines.  Any recent colds or infections you have had or currently have.  Previous problems you or members of your family have had with the use of anesthetics.  Any blood disorders or blood clotting problems you have.  Previous surgeries you have had.  Medical conditions you have. RISKS AND COMPLICATIONS Generally, this is a safe procedure. However, as with any procedure, problems can occur. Possible problems include:  Damage to nerves or blood vessels.  Excess bleeding.  Blood clots.  Infection. BEFORE THE PROCEDURE  Do not eat or drink for 6-8 hours before the procedure.  Take medicines as directed by your surgeon. Ask your surgeon about changing or stopping your regular medicines.  You may have lab tests the morning of surgery. PROCEDURE  You will be given one of the following:   A medicine that numbs the area (local anesthesia).  A medicine that makes you go to sleep (general anesthesia).  A medicine injected into your spine that numbs your body below the waist (spinal anesthesia). Most often, several small cuts (incisions) are made in the knee. The arthroscope and instruments go into the incisions to repair the damage. The torn portion of the meniscus is removed.   AFTER THE PROCEDURE  You will be taken to the recovery area where your progress will be monitored. When you are awake, stable, and taking fluids without complications, you will be allowed to go home. This is usually the same day. A torn or stretched ligament (ligament sprain) may take 6-8 weeks to heal.   It  takes about the 4-6 WEEKS if your surgeon removed a torn meniscus.  A repaired meniscus may require 6-12 weeks of recovery time.  A torn ligament needing reconstructive surgery may take 6-12 months to heal fully.   This information is not intended to replace advice given to you by your health care provider. Make sure you discuss any questions you have with your health care provider. You have decided to proceed with operative arthroscopy of the knee. You have decided not to continue with nonoperative measures such as but not limited to oral medication, weight loss, activity modification, physical therapy, bracing, or injection.  We will perform operative arthroscopy of the knee. Some of the risks associated with arthroscopic surgery of the knee include but are not limited to Bleeding Infection Swelling Stiffness Blood clot Pain  If you're not comfortable with these risks and would like to continue with nonoperative treatment please let Dr. Harrison know prior to your surgery.   Document Released: 02/03/2000 Document Revised: 02/10/2013 Document Reviewed: 07/04/2012 Elsevier Interactive Patient Education 2016 [employer] You have decided to proceed with operative arthroscopy of the knee. You have decided not to continue with nonoperative measures such as but not limited to oral medication, weight loss, activity modification, physical therapy, bracing, or injection.  We will perform operative arthroscopy of the knee. Some of the risks associated with arthroscopic surgery of the knee include but are not limited to Bleeding Infection Swelling Stiffness Blood clot Pain  If you're not comfortable with these risks and would like to continue with nonoperative treatment please let Dr. Harrison   know prior to your surgery.  Steps to Quit Smoking Smoking tobacco can be bad for your health. It can also affect almost every organ in your body. Smoking puts you and people around you at risk for  many serious Griff[place]ray[place]-lasting (chronic) diseases. Quitting smoking is hard, but it is one of the best things that you can do for your health. It is never too late to quit. What are the benefits of quitting smoking? When you quit smoking, you lower your risk for getting serious diseases and conditions. They can include:  Lung cancer or lung disease.  Heart disease.  Stroke.  Heart attack.  Not being able to have children (infertility).  Weak bones (osteoporosis) and broken bones (fractures).  If you have coughing, wheezing, and shortness of breath, those symptoms may get better when you quit. You may also get sick less often. If you are pregnant, quitting smoking can help to lower your chances of having a baby of low birth weight. What can I do to help me quit smoking? Talk with your doctor about what can help you quit smoking. Some things you can do (strategies) include:  Quitting smoking totally, instead of slowly cutting back how much you smoke over a period of time.  Going to in-person counseling. You are more likely to quit if you go to many counseling sessions.  Using resources and support systems, such as: ? Database administrator with a Social worker. ? Phone quitlines. ? Careers information officer. ? Support groups or group counseling. ? Text messaging programs. ? Mobile phone apps or applications.  Taking medicines. Some of these medicines may have nicotine in them. If you are pregnant or breastfeeding, do not take any medicines to quit smoking unless your doctor says it is okay. Talk with your doctor about counseling or other things that can help you.  Talk with your doctor about using more than one strategy at the same time, such as taking medicines while you are also going to in-person counseling. This can help make quitting easier. What things can I do to make it easier to quit? Quitting smoking might feel very hard at first, but there is a lot that you can do to make it easier. Take  these steps:  Talk to your family and friends. Ask them to support and encourage you.  Call phone quitlines, reach out to support groups, or work with a Social worker.  Ask people who smoke to not smoke around you.  Avoid places that make you want (trigger) to smoke, such as: ? Bars. ? Parties. ? Smoke-break areas at work.  Spend time with people who do not smoke.  Lower the stress in your life. Stress can make you want to smoke. Try these things to help your stress: ? Getting regular exercise. ? Deep-breathing exercises. ? Yoga. ? Meditating. ? Doing a body scan. To do this, close your eyes, focus on one area of your body at a time from head to toe, and notice which parts of your body are tense. Try to relax the muscles in those areas.  Download or buy apps on your mobile phone or tablet that can help you stick to your quit plan. There are many free apps, such as QuitGuide from the [employer] Office manager for Disease Control and Prevention). You can find more support from smokefree.gov and other websites.  This information is not intended to replace advice given to you by your health care provider. Make sure you discuss any questions you have with your  health care provider. Document Released: 12/02/2008 Document Revised: 10/04/2015 Document Reviewed: 06/22/2014 Elsevier Interactive Patient Education  2018 [employer].

## 2017-03-21 ENCOUNTER — Telehealth: Payer: Self-pay | Admitting: Orthopedic Surgery

## 2017-03-21 NOTE — Telephone Encounter (Signed)
Can you post this for the date she requested ? Feb 28th? I will call her   She needs arthroscopy of the right knee and a partial medial meniscectomy

## 2017-03-21 NOTE — Telephone Encounter (Signed)
Patient called to discuss scheduling of surgery as discussed at office visit yesterday, 03/20/17 - states possibly for date of 04/18/17. Please call at her office ph# [phone] (hours there 7:30am to 4:30pm

## 2017-03-22 ENCOUNTER — Other Ambulatory Visit: Payer: Self-pay | Admitting: Orthopedic Surgery

## 2017-03-22 NOTE — Telephone Encounter (Signed)
Called, left message for her to call me back.  

## 2017-03-22 NOTE — Telephone Encounter (Signed)
Done

## 2017-03-22 NOTE — Telephone Encounter (Signed)
Called patient to advise. Will work on authorization

## 2017-04-01 ENCOUNTER — Telehealth: Payer: Self-pay | Admitting: Radiology

## 2017-04-01 NOTE — Telephone Encounter (Signed)
Called [employer] to initiate prior augh for CPT code [id number] pending Auth number is [id number] Authorization is required, have faxed to [facility] [phone]

## 2017-04-03 ENCOUNTER — Encounter: Payer: Self-pay | Admitting: Nurse Practitioner

## 2017-04-03 ENCOUNTER — Ambulatory Visit: Payer: 59 | Admitting: Nurse Practitioner

## 2017-04-03 ENCOUNTER — Encounter: Payer: Self-pay | Admitting: Orthopedic Surgery

## 2017-04-03 VITALS — BP 134/96 | Temp 98.4°F | Ht 69.5 in | Wt 284.0 lb

## 2017-04-03 DIAGNOSIS — J0110 Acute frontal sinusitis, unspecified: Secondary | ICD-10-CM | POA: Diagnosis not present

## 2017-04-03 MED ORDER — METHYLPREDNISOLONE ACETATE 40 MG/ML IJ SUSP
40.0000 mg | Freq: Once | INTRAMUSCULAR | Status: AC
  Administered 2017-04-03: 40 mg via INTRAMUSCULAR

## 2017-04-03 MED ORDER — HYDROCODONE-HOMATROPINE 5-1.5 MG/5ML PO SYRP: 5.0000 mL | ORAL_SOLUTION | ORAL | 0 refills | Status: DC | PRN

## 2017-04-03 MED ORDER — AZITHROMYCIN 250 MG PO TABS: ORAL_TABLET | ORAL | 0 refills | Status: DC

## 2017-04-05 ENCOUNTER — Encounter: Payer: Self-pay | Admitting: Nurse Practitioner

## 2017-04-05 NOTE — Progress Notes (Signed)
Subjective: Presents for complaints of cough and congestion for over 4 weeks.  Worse over the past few days with weather change.  No fever.  Sore throat.  Frontal area headache.  Runny nose.  Spells of coughing producing green mucus.  Worse at night.  Nausea, no vomiting.  Minimal diarrhea, none today.  No abdominal pain.  No ear pain.  Possible slight wheezing at times, defers need for an inhaler.  Continues to smoke half pack per day.  Taking fluids well.  Voiding normal limit.  Objective:   BP (!) 134/96   Temp 98.4 F (36.9 C) (Oral)   Ht 5' 9.5" (1.765 m)   Wt 284 lb (128.8 kg)   BMI 41.34 kg/m  NAD.  Alert, oriented.  TMs significant cloudy effusion bilateral, no erythema.  Pharynx injected with green PND noted.  Neck supple with mild soft anterior adenopathy.  Lungs clear.  Heart regular rate rhythm.  Abdomen soft nontender.    Assessment:  Acute non-recurrent frontal sinusitis - Plan: methylPREDNISolone acetate (DEPO-MEDROL) injection 40 mg    Plan:   Meds ordered this encounter  Medications  . azithromycin (ZITHROMAX Z-PAK) 250 MG tablet    Sig: Take 2 tablets (500 mg) on  Day 1,  followed by 1 tablet (250 mg) once daily on Days 2 through 5.    Dispense:  6 each    Refill:  0    Order Specific Question:   Supervising Provider    Answer:   Mikey Kirschner [[id number]]  . HYDROcodone-homatropine (HYCODAN) 5-1.5 MG/5ML syrup    Sig: Take 5 mLs by mouth every 4 (four) hours as needed.    Dispense:  90 mL    Refill:  0    Order Specific Question:   Supervising Provider    Answer:   Mikey Kirschner [[id number]]  . methylPREDNISolone acetate (DEPO-MEDROL) injection 40 mg   Reviewed symptomatic care and warning signs.  Call back in 4-5 days if no improvement, sooner if worse.

## 2017-04-09 NOTE — Telephone Encounter (Signed)
Has been approved by [facility]

## 2017-04-11 NOTE — Patient Instructions (Signed)
Carol Sharp  04/11/2017     @PREFPERIOPPHARMACY @   Your procedure is scheduled on  04/18/2017 .  Report to [facility] at  645  A.M.  Call this number if you have problems the morning of surgery:  [phone]   Remember:  Do not eat food or drink liquids after midnight.  Take these medicines the morning of surgery with A SIP OF WATER  Amlodipine, cymbalta, lisinopril, relafen, oxybutynin, zantac.   Do not wear jewelry, make-up or nail polish.  Do not wear lotions, powders, or perfumes, or deodorant.  Do not shave 48 hours prior to surgery.  Men may shave face and neck.  Do not bring valuables to the hospital.  [facility] is not responsible for any belongings or valuables.  Contacts, dentures or bridgework may not be worn into surgery.  Leave your suitcase in the car.  After surgery it may be brought to your room.  For patients admitted to the hospital, discharge time will be determined by your treatment team.  Patients discharged the day of surgery will not be allowed to drive home.   Name and phone number of your driver:   family Special instructions:  None  Please read over the following fact sheets that you were given. Anesthesia Post-op Instructions and Care and Recovery After Surgery      Knee Ligament Injury, Arthroscopy Arthroscopy is a surgical technique in which your health care provider examines your knee through a small, pencil-sized telescope (arthroscope). Often, repairs to injured ligaments can be done with instruments in the arthroscope. Arthroscopy is less invasive than open-knee surgery. Tell a health care provider about:  Any allergies you have.  All medicines you are taking, including vitamins, herbs, eye drops, creams, and over-the-counter medicines.  Any problems you or family members have had with anesthetic medicines.  Any blood disorders you have.  Any surgeries you have had.  Any medical conditions you have. What are  the risks? Generally, this is a safe procedure. However, as with any procedure, problems can occur. Possible problems include:  Infection.  Bleeding.  Stiffness.  What happens before the procedure?  Ask your health care provider about changing or stopping any regular medicines. Avoid taking aspirin or blood thinners as directed by your health care provider.  Do not eat or drink anything after midnight the night before surgery.  If you smoke, do not smoke for at least 2 weeks before your surgery.  Do not drink alcohol starting the day before your surgery.  Let your health care provider know if you develop a cold or any infection before your surgery.  Arrange for someone to drive you home after the surgery or after your hospital stay. Also arrange for someone to help you with activities during recovery. What happens during the procedure?  Small monitors will be put on your body. They are used to check your heart, blood pressure, and oxygen levels.  An IV access tube will be put into one of your veins. Medicine will be able to flow directly into your body through this IV tube.  You might be given a medicine to help you relax (sedative).  You will be given a medicine that makes you go to sleep (general anesthetic), and a breathing tube will be placed into your lungs during the procedure.  Several small incisions are made in your knee. Saline fluid is placed into one of the incisions to expand  the knee and clear away any blood in the knee.  Your health care provider will insert the arthroscope to examine the injured knee.  During arthroscopy, your health care provider may find a partial or complete tear in a ligament.  Tools can be inserted through the other incisions to repair the injured ligaments.  The incisions are then closed with absorbable stitches and covered with dressings. What happens after the procedure?  You will be taken to the recovery area where you will be  monitored.  When you are awake, stable, and taking fluids without problems, you will be allowed to go home. This information is not intended to replace advice given to you by your health care provider. Make sure you discuss any questions you have with your health care provider. Document Released: 02/03/2000 Document Revised: 07/14/2015 Document Reviewed: 09/17/2012 Elsevier Interactive Patient Education  2017 [place].  Arthroscopic Knee Ligament Repair, Care After This sheet gives you information about how to care for yourself after your procedure. Your health care provider may also give you more specific instructions. If you have problems or questions, contact your health care provider. What can I expect after the procedure? After the procedure, it is common to have:  Pain in your knee.  Bruising and swelling on your knee, calf, and ankle for 3-4 days.  Fatigue.  Follow these instructions at home: If you have a brace or immobilizer:  Wear the brace or immobilizer as told by your health care provider. Remove it only as told by your health care provider.  Loosen the splint or immobilizer if your toes tingle, become numb, or turn cold and blue.  Keep the brace or immobilizer clean. Bathing  Do not take baths, swim, or use a hot tub until your health care provider approves. Ask your health care provider if you can take showers.  Keep your bandage (dressing) dry until your health care provider says that it can be removed. Cover it and your brace or immobilizer with a watertight covering when you take a shower. Incision care  Follow instructions from your health care provider about how to take care of your incision. Make sure you: ? Wash your hands with soap and water before you change your bandage (dressing). If soap and water are not available, use hand sanitizer. ? Change your dressing as told by your health care provider. ? Leave stitches (sutures), skin glue, or adhesive  strips in place. These skin closures may need to stay in place for 2 weeks or longer. If adhesive strip edges start to loosen and curl up, you may trim the loose edges. Do not remove adhesive strips completely unless your health care provider tells you to do that.  Check your incision area every day for signs of infection. Check for: ? More redness, swelling, or pain. ? More fluid or blood. ? Warmth. ? Pus or a bad smell. Managing pain, stiffness, and swelling  If directed, put ice on the affected area. ? If you have a removable brace or immobilizer, remove it as told by your health care provider. ? Put ice in a plastic bag. ? Place a towel between your skin and the bag or between your brace or immobilizer and the bag. ? Leave the ice on for 20 minutes, 2-3 times a day.  Move your toes often to avoid stiffness and to lessen swelling.  Raise (elevate) the injured area above the level of your heart while you are sitting or lying down. Driving  Do not drive until your health care provider approves. If you have a brace or immobilizer on your leg, ask your health care provider when it is safe for you to drive.  Do not drive or use heavy machinery while taking prescription pain medicine. Activity  Rest as directed. Ask your health care provider what activities are safe for you.  Do physical therapy exercises as told by your health care provider. Physical therapy will help you regain strength and motion in your knee.  Follow instructions from your health care provider about: ? When you may start motion exercises. ? When you may start riding a stationary bike and doing other low-impact activities. ? When you may start to jog and do other high-impact activities. Safety  Do not use the injured limb to support your body weight until your health care provider says that you can. Use crutches as told by your health care provider. General instructions  Do not use any products that contain  nicotine or tobacco, such as cigarettes and e-cigarettes. These can delay bone healing. If you need help quitting, ask your health care provider.  To prevent or treat constipation while you are taking prescription pain medicine, your health care provider may recommend that you: ? Drink enough fluid to keep your urine clear or pale yellow. ? Take over-the-counter or prescription medicines. ? Eat foods that are high in fiber, such as fresh fruits and vegetables, whole grains, and beans. ? Limit foods that are high in fat and processed sugars, such as fried and sweet foods.  Take over-the-counter and prescription medicines only as told by your health care provider.  Keep all follow-up visits as told by your health care provider. This is important. Contact a health care provider if:  You have more redness, swelling, or pain around an incision.  You have more fluid or blood coming from an incision.  Your incision feels warm to the touch.  You have a fever.  You have pain or swelling in your knee, and it gets worse.  You have pain that does not get better with medicine. Get help right away if:  You have trouble breathing.  You have pus or a bad smell coming from an incision.  You have numbness and tingling near the knee joint. Summary  After the procedure, it is common to have knee pain with bruising and swelling on your knee, calf, and ankle.  Icing your knee and raising your leg above the level of your heart will help control the pain and the swelling.  Do physical therapy exercises as told by your health care provider. Physical therapy will help you regain strength and motion in your knee. This information is not intended to replace advice given to you by your health care provider. Make sure you discuss any questions you have with your health care provider. Document Released: 11/26/2012 Document Revised: 01/31/2016 Document Reviewed: 01/31/2016 Elsevier Interactive Patient  Education  2017 [place] Anesthesia, Adult General anesthesia is the use of medicines to make a person "go to sleep" (be unconscious) for a medical procedure. General anesthesia is often recommended when a procedure:  Is long.  Requires you to be still or in an unusual position.  Is major and can cause you to lose blood.  Is impossible to do without general anesthesia.  The medicines used for general anesthesia are called general anesthetics. In addition to making you sleep, the medicines:  Prevent pain.  Control your blood pressure.  Relax  your muscles.  Tell a health care provider about:  Any allergies you have.  All medicines you are taking, including vitamins, herbs, eye drops, creams, and over-the-counter medicines.  Any problems you or family members have had with anesthetic medicines.  Types of anesthetics you have had in the past.  Any bleeding disorders you have.  Any surgeries you have had.  Any medical conditions you have.  Any history of heart or lung conditions, such as heart failure, sleep apnea, or chronic obstructive pulmonary disease (COPD).  Whether you are pregnant or may be pregnant.  Whether you use tobacco, alcohol, marijuana, or street drugs.  Any history of Armed forces logistics/support/administrative officer.  Any history of depression or anxiety. What are the risks? Generally, this is a safe procedure. However, problems may occur, including:  Allergic reaction to anesthetics.  Lung and heart problems.  Inhaling food or liquids from your stomach into your lungs (aspiration).  Injury to nerves.  Waking up during your procedure and being unable to move (rare).  Extreme agitation or a state of mental confusion (delirium) when you wake up from the anesthetic.  Air in the bloodstream, which can lead to stroke.  These problems are more likely to develop if you are having a major surgery or if you have an advanced medical condition. You can prevent some of  these complications by answering all of your health care provider's questions thoroughly and by following all pre-procedure instructions. General anesthesia can cause side effects, including:  Nausea or vomiting  A sore throat from the breathing tube.  Feeling cold or shivery.  Feeling tired, washed out, or achy.  Sleepiness or drowsiness.  Confusion or agitation.  What happens before the procedure? Staying hydrated Follow instructions from your health care provider about hydration, which may include:  Up to 2 hours before the procedure - you may continue to drink clear liquids, such as water, clear fruit juice, black coffee, and plain tea.  Eating and drinking restrictions Follow instructions from your health care provider about eating and drinking, which may include:  8 hours before the procedure - stop eating heavy meals or foods such as meat, fried foods, or fatty foods.  6 hours before the procedure - stop eating light meals or foods, such as toast or cereal.  6 hours before the procedure - stop drinking milk or drinks that contain milk.  2 hours before the procedure - stop drinking clear liquids.  Medicines  Ask your health care provider about: ? Changing or stopping your regular medicines. This is especially important if you are taking diabetes medicines or blood thinners. ? Taking medicines such as aspirin and ibuprofen. These medicines can thin your blood. Do not take these medicines before your procedure if your health care provider instructs you not to. ? Taking new dietary supplements or medicines. Do not take these during the week before your procedure unless your health care provider approves them.  If you are told to take a medicine or to continue taking a medicine on the day of the procedure, take the medicine with sips of water. General instructions   Ask if you will be going home the same day, the following day, or after a longer hospital stay. ? Plan to  have someone take you home. ? Plan to have someone stay with you for the first 24 hours after you leave the hospital or clinic.  For 3-6 weeks before the procedure, try not to use any tobacco products, such as cigarettes, chewing  tobacco, and e-cigarettes.  You may brush your teeth on the morning of the procedure, but make sure to spit out the toothpaste. What happens during the procedure?  You will be given anesthetics through a mask and through an IV tube in one of your veins.  You may receive medicine to help you relax (sedative).  As soon as you are asleep, a breathing tube may be used to help you breathe.  An anesthesia specialist will stay with you throughout the procedure. He or she will help keep you comfortable and safe by continuing to give you medicines and adjusting the amount of medicine that you get. He or she will also watch your blood pressure, pulse, and oxygen levels to make sure that the anesthetics do not cause any problems.  If a breathing tube was used to help you breathe, it will be removed before you wake up. The procedure may vary among health care providers and hospitals. What happens after the procedure?  You will wake up, often slowly, after the procedure is complete, usually in a recovery area.  Your blood pressure, heart rate, breathing rate, and blood oxygen level will be monitored until the medicines you were given have worn off.  You may be given medicine to help you calm down if you feel anxious or agitated.  If you will be going home the same day, your health care provider may check to make sure you can stand, drink, and urinate.  Your health care providers will treat your pain and side effects before you go home.  Do not drive for 24 hours if you received a sedative.  You may: ? Feel nauseous and vomit. ? Have a sore throat. ? Have mental slowness. ? Feel cold or shivery. ? Feel sleepy. ? Feel tired. ? Feel sore or achy, even in parts of your  body where you did not have surgery. This information is not intended to replace advice given to you by your health care provider. Make sure you discuss any questions you have with your health care provider. Document Released: 05/15/2007 Document Revised: 07/19/2015 Document Reviewed: 01/20/2015 Elsevier Interactive Patient Education  2018 [place] Anesthesia, Adult, Care After These instructions provide you with information about caring for yourself after your procedure. Your health care provider may also give you more specific instructions. Your treatment has been planned according to current medical practices, but problems sometimes occur. Call your health care provider if you have any problems or questions after your procedure. What can I expect after the procedure? After the procedure, it is common to have:  Vomiting.  A sore throat.  Mental slowness.  It is common to feel:  Nauseous.  Cold or shivery.  Sleepy.  Tired.  Sore or achy, even in parts of your body where you did not have surgery.  Follow these instructions at home: For at least 24 hours after the procedure:  Do not: ? Participate in activities where you could fall or become injured. ? Drive. ? Use heavy machinery. ? Drink alcohol. ? Take sleeping pills or medicines that cause drowsiness. ? Make important decisions or sign legal documents. ? Take care of children on your own.  Rest. Eating and drinking  If you vomit, drink water, juice, or soup when you can drink without vomiting.  Drink enough fluid to keep your urine clear or pale yellow.  Make sure you have little or no nausea before eating solid foods.  Follow the diet recommended by your health  care provider. General instructions  Have a responsible adult stay with you until you are awake and alert.  Return to your normal activities as told by your health care provider. Ask your health care provider what activities are safe for  you.  Take over-the-counter and prescription medicines only as told by your health care provider.  If you smoke, do not smoke without supervision.  Keep all follow-up visits as told by your health care provider. This is important. Contact a health care provider if:  You continue to have nausea or vomiting at home, and medicines are not helpful.  You cannot drink fluids or start eating again.  You cannot urinate after 8-12 hours.  You develop a skin rash.  You have fever.  You have increasing redness at the site of your procedure. Get help right away if:  You have difficulty breathing.  You have chest pain.  You have unexpected bleeding.  You feel that you are having a life-threatening or urgent problem. This information is not intended to replace advice given to you by your health care provider. Make sure you discuss any questions you have with your health care provider. Document Released: 05/14/2000 Document Revised: 07/11/2015 Document Reviewed: 01/20/2015 Elsevier Interactive Patient Education  [employer].

## 2017-04-14 ENCOUNTER — Other Ambulatory Visit: Payer: Self-pay | Admitting: Family Medicine

## 2017-04-15 ENCOUNTER — Encounter (HOSPITAL_COMMUNITY)
Admission: RE | Admit: 2017-04-15 | Discharge: 2017-04-15 | Disposition: A | Discharge status: Home / Self Care | Payer: 59 | Source: Ambulatory Visit | Attending: Orthopedic Surgery | Admitting: Orthopedic Surgery

## 2017-04-16 ENCOUNTER — Encounter (HOSPITAL_COMMUNITY)
Admission: RE | Admit: 2017-04-16 | Discharge: 2017-04-16 | Disposition: A | Discharge status: Home / Self Care | Payer: 59 | Source: Ambulatory Visit | Attending: Orthopedic Surgery | Admitting: Orthopedic Surgery

## 2017-04-16 ENCOUNTER — Encounter (HOSPITAL_COMMUNITY): Payer: Self-pay

## 2017-04-16 ENCOUNTER — Other Ambulatory Visit: Payer: Self-pay

## 2017-04-16 DIAGNOSIS — Z79899 Other long term (current) drug therapy: Secondary | ICD-10-CM | POA: Diagnosis not present

## 2017-04-16 DIAGNOSIS — S83241A Other tear of medial meniscus, current injury, right knee, initial encounter: Secondary | ICD-10-CM | POA: Diagnosis not present

## 2017-04-16 DIAGNOSIS — F329 Major depressive disorder, single episode, unspecified: Secondary | ICD-10-CM | POA: Diagnosis not present

## 2017-04-16 DIAGNOSIS — X501XXA Overexertion from prolonged static or awkward postures, initial encounter: Secondary | ICD-10-CM | POA: Diagnosis not present

## 2017-04-16 DIAGNOSIS — W010XXA Fall on same level from slipping, tripping and stumbling without subsequent striking against object, initial encounter: Secondary | ICD-10-CM | POA: Diagnosis not present

## 2017-04-16 DIAGNOSIS — Y92007 Garden or yard of unspecified non-institutional (private) residence as the place of occurrence of the external cause: Secondary | ICD-10-CM | POA: Diagnosis not present

## 2017-04-16 DIAGNOSIS — M1711 Unilateral primary osteoarthritis, right knee: Secondary | ICD-10-CM | POA: Diagnosis not present

## 2017-04-16 DIAGNOSIS — F419 Anxiety disorder, unspecified: Secondary | ICD-10-CM | POA: Diagnosis not present

## 2017-04-16 DIAGNOSIS — I10 Essential (primary) hypertension: Secondary | ICD-10-CM | POA: Diagnosis not present

## 2017-04-16 HISTORY — DX: Unspecified osteoarthritis, unspecified site: M19.90

## 2017-04-16 LAB — CBC WITH DIFFERENTIAL/PLATELET
Basophils Absolute: 0.1 K/uL (ref 0.0–0.1)
Basophils Relative: 1 %
Eosinophils Absolute: 0.3 K/uL (ref 0.0–0.7)
Eosinophils Relative: 3 %
HCT: 43.8 % (ref 36.0–46.0)
Hemoglobin: 14.7 g/dL (ref 12.0–15.0)
Lymphocytes Relative: 36 %
Lymphs Abs: 3.7 K/uL (ref 0.7–4.0)
MCH: 31.6 pg (ref 26.0–34.0)
MCHC: 33.6 g/dL (ref 30.0–36.0)
MCV: 94.2 fL (ref 78.0–100.0)
Monocytes Absolute: 0.3 K/uL (ref 0.1–1.0)
Monocytes Relative: 3 %
Neutro Abs: 5.9 K/uL (ref 1.7–7.7)
Neutrophils Relative %: 57 %
Platelets: 348 K/uL (ref 150–400)
RBC: 4.65 MIL/uL (ref 3.87–5.11)
RDW: 12.8 % (ref 11.5–15.5)
WBC: 10.2 K/uL (ref 4.0–10.5)

## 2017-04-16 LAB — COMPREHENSIVE METABOLIC PANEL
ALT: 25 U/L (ref 14–54)
AST: 19 U/L (ref 15–41)
Albumin: 4.2 g/dL (ref 3.5–5.0)
Alkaline Phosphatase: 53 U/L (ref 38–126)
Anion gap: 10 (ref 5–15)
BUN: 16 mg/dL (ref 6–20)
CO2: 25 mmol/L (ref 22–32)
Calcium: 8.9 mg/dL (ref 8.9–10.3)
Chloride: 101 mmol/L (ref 101–111)
Creatinine, Ser: 0.82 mg/dL (ref 0.44–1.00)
GFR calc Af Amer: >60 mL/min (ref >60)
GFR calc non Af Amer: >60 mL/min (ref >60)
Glucose, Bld: 90 mg/dL (ref 65–99)
Potassium: 3.3 mmol/L — ABNORMAL LOW (ref 3.5–5.1)
Sodium: 136 mmol/L (ref 135–145)
Total Bilirubin: 0.4 mg/dL (ref 0.3–1.2)
Total Protein: 7 g/dL (ref 6.5–8.1)

## 2017-04-16 LAB — SURGICAL PCR SCREEN
MRSA, PCR: NEGATIVE
Staphylococcus aureus: NEGATIVE

## 2017-04-16 LAB — HCG, SERUM, QUALITATIVE: Preg, Serum: NEGATIVE

## 2017-04-17 NOTE — H&P (Signed)
  Surgery history and physical  Chief Complaint  Patient presents with  . Knee Pain      right knee painful had injury 2010 pain increased in past month      42 year old female presents for evaluation of her right knee.  She first injured her knee back in 2010 doing some type of knee kick exercises but she was treated and that got better.   Sometime in the late summer early fall she twisted her knee in the yard.  She eventually saw her primary care doctor who started her on a steroid Dosepak followed by ibuprofen ice and rest and she has not improved   She now complains of constant medial dull aching right knee pain which radiates across the front of the knee is associated with inability to fully flex or straighten the knee as well as a feeling that something is in there and she feels a popping sensation in the joint.  Symptoms have worsened over the last month   Review of Systems  Constitutional: Negative for chills, fever and weight loss.  Respiratory: Negative for shortness of breath.   Cardiovascular: Negative for chest pain.  Neurological: Negative for tingling.        Past Medical History:  Diagnosis Date  . Anxiety    . Breast nodule 08/02/2015  . Depression    . Hypertension    . MRSA cellulitis May 2005      BP (!) 163/101   Pulse (!) 101   Ht 5' 9.5" (1.765 m)   Wt 281 lb (127.5 kg)   BMI 40.90 kg/m   Physical Exam  Constitutional: She is oriented to person, place, and time. She appears well-developed and well-nourished.  Musculoskeletal:       Legs: Neurological: She is alert and oriented to person, place, and time. Gait abnormal.  Limping favoring the right leg  Psychiatric: She has a normal mood and affect. Judgment normal.  Vitals reviewed.    Imaging was ordered and read in the office   Advanced arthritis for age medial lateral and patellofemoral compartments     Encounter Diagnoses  Name Primary?  . Chronic pain of right knee Yes  . Degenerative  tear of medial meniscus of right knee      MRI report patellofemoral: Thinned and irregular throughout without focal defect.   Medial: Thinned with associated mild to moderate joint space narrowing. No focal defect.   Lateral:  Mildly to moderately degenerated.   Joint:  Small effusion.     IMPRESSION: Large radial tear just peripheral to the root of the posterior horn of the medial meniscus.   Moderate osteoarthritis about the knee appears worst in the patellofemoral and medial compartments.   Very small Baker's cyst.     Electronically Signed   By: Inge Rise M.D.   On: 03/13/2017 08:43     I reviewed the MRI report with the patient she understands she has a torn meniscus and osteoarthritis the osteoarthritis cannot be fixed with surgery she will need further care if she has arthroscopy of the right knee to remove the meniscal tear.    arthroscopy of the right knee and a partial medial meniscectomy

## 2017-04-18 ENCOUNTER — Ambulatory Visit (HOSPITAL_COMMUNITY)
Admission: RE | Admit: 2017-04-18 | Discharge: 2017-04-18 | Disposition: A | Discharge status: Home / Self Care | Payer: 59 | Source: Ambulatory Visit | Attending: Orthopedic Surgery | Admitting: Orthopedic Surgery

## 2017-04-18 ENCOUNTER — Ambulatory Visit (HOSPITAL_COMMUNITY): Payer: 59 | Admitting: Anesthesiology

## 2017-04-18 ENCOUNTER — Encounter (HOSPITAL_COMMUNITY): Payer: Self-pay | Admitting: *Deleted

## 2017-04-18 ENCOUNTER — Other Ambulatory Visit: Payer: Self-pay

## 2017-04-18 ENCOUNTER — Encounter (HOSPITAL_COMMUNITY)
Admission: RE | Disposition: A | Discharge status: Home / Self Care | Payer: Self-pay | Source: Ambulatory Visit | Attending: Orthopedic Surgery

## 2017-04-18 DIAGNOSIS — S83241A Other tear of medial meniscus, current injury, right knee, initial encounter: Secondary | ICD-10-CM | POA: Insufficient documentation

## 2017-04-18 DIAGNOSIS — Z79899 Other long term (current) drug therapy: Secondary | ICD-10-CM | POA: Insufficient documentation

## 2017-04-18 DIAGNOSIS — F329 Major depressive disorder, single episode, unspecified: Secondary | ICD-10-CM | POA: Insufficient documentation

## 2017-04-18 DIAGNOSIS — F419 Anxiety disorder, unspecified: Secondary | ICD-10-CM | POA: Insufficient documentation

## 2017-04-18 DIAGNOSIS — I10 Essential (primary) hypertension: Secondary | ICD-10-CM | POA: Diagnosis not present

## 2017-04-18 DIAGNOSIS — Z9889 Other specified postprocedural states: Secondary | ICD-10-CM

## 2017-04-18 DIAGNOSIS — W010XXA Fall on same level from slipping, tripping and stumbling without subsequent striking against object, initial encounter: Secondary | ICD-10-CM | POA: Insufficient documentation

## 2017-04-18 DIAGNOSIS — M23321 Other meniscus derangements, posterior horn of medial meniscus, right knee: Secondary | ICD-10-CM

## 2017-04-18 DIAGNOSIS — M1711 Unilateral primary osteoarthritis, right knee: Secondary | ICD-10-CM | POA: Insufficient documentation

## 2017-04-18 DIAGNOSIS — X501XXA Overexertion from prolonged static or awkward postures, initial encounter: Secondary | ICD-10-CM | POA: Insufficient documentation

## 2017-04-18 DIAGNOSIS — Y92007 Garden or yard of unspecified non-institutional (private) residence as the place of occurrence of the external cause: Secondary | ICD-10-CM | POA: Insufficient documentation

## 2017-04-18 HISTORY — PX: KNEE ARTHROSCOPY WITH MEDIAL MENISECTOMY: SHX5651

## 2017-04-18 SURGERY — ARTHROSCOPY, KNEE, WITH MEDIAL MENISCECTOMY
Anesthesia: General | Site: Knee | Laterality: Right

## 2017-04-18 MED ORDER — LACTATED RINGERS IV SOLN
INTRAVENOUS | Status: DC
  Administered 2017-04-18 (×2): via INTRAVENOUS

## 2017-04-18 MED ORDER — ONDANSETRON HCL 4 MG/2ML IJ SOLN
INTRAMUSCULAR | Status: AC
  Filled 2017-04-18: qty 2

## 2017-04-18 MED ORDER — SODIUM CHLORIDE 0.9 % IR SOLN
Status: DC | PRN
  Administered 2017-04-18 (×2): 3000 mL

## 2017-04-18 MED ORDER — ENOXAPARIN SODIUM 40 MG/0.4ML ~~LOC~~ SOLN
40.0000 mg | Freq: Once | SUBCUTANEOUS | Status: AC
  Administered 2017-04-18: 40 mg via SUBCUTANEOUS
  Filled 2017-04-18: qty 0.4

## 2017-04-18 MED ORDER — ROCURONIUM BROMIDE 50 MG/5ML IV SOLN
INTRAVENOUS | Status: AC
  Filled 2017-04-18: qty 1

## 2017-04-18 MED ORDER — LIDOCAINE HCL (CARDIAC) 10 MG/ML IV SOLN
INTRAVENOUS | Status: DC | PRN
  Administered 2017-04-18: 50 mg via INTRAVENOUS

## 2017-04-18 MED ORDER — FENTANYL CITRATE (PF) 250 MCG/5ML IJ SOLN
INTRAMUSCULAR | Status: AC
  Filled 2017-04-18: qty 5

## 2017-04-18 MED ORDER — ONDANSETRON HCL 4 MG/2ML IJ SOLN
4.0000 mg | Freq: Once | INTRAMUSCULAR | Status: AC
  Administered 2017-04-18: 4 mg via INTRAVENOUS

## 2017-04-18 MED ORDER — POVIDONE-IODINE 10 % EX SWAB
2.0000 "application " | Freq: Once | CUTANEOUS | Status: AC
  Administered 2017-04-18: 2 via TOPICAL

## 2017-04-18 MED ORDER — HYDROCODONE-ACETAMINOPHEN 7.5-325 MG PO TABS
1.0000 | ORAL_TABLET | Freq: Once | ORAL | Status: AC
  Administered 2017-04-18: 1 via ORAL
  Filled 2017-04-18: qty 1

## 2017-04-18 MED ORDER — PROPOFOL 10 MG/ML IV BOLUS
INTRAVENOUS | Status: DC | PRN
  Administered 2017-04-18: 200 mg via INTRAVENOUS

## 2017-04-18 MED ORDER — SODIUM CHLORIDE 0.9 % IJ SOLN
INTRAMUSCULAR | Status: AC
  Filled 2017-04-18: qty 10

## 2017-04-18 MED ORDER — PROPOFOL 10 MG/ML IV BOLUS
INTRAVENOUS | Status: AC
  Filled 2017-04-18: qty 40

## 2017-04-18 MED ORDER — HYDROCODONE-ACETAMINOPHEN 10-325 MG PO TABS: 1.0000 | ORAL_TABLET | ORAL | 0 refills | Status: DC | PRN

## 2017-04-18 MED ORDER — DEXAMETHASONE SODIUM PHOSPHATE 4 MG/ML IJ SOLN
INTRAMUSCULAR | Status: AC
  Filled 2017-04-18: qty 1

## 2017-04-18 MED ORDER — BUPIVACAINE-EPINEPHRINE (PF) 0.5% -1:200000 IJ SOLN
INTRAMUSCULAR | Status: DC | PRN
  Administered 2017-04-18: 60 mL

## 2017-04-18 MED ORDER — EPHEDRINE SULFATE 50 MG/ML IJ SOLN
INTRAMUSCULAR | Status: AC
  Filled 2017-04-18: qty 1

## 2017-04-18 MED ORDER — FENTANYL CITRATE (PF) 100 MCG/2ML IJ SOLN
INTRAMUSCULAR | Status: DC | PRN
  Administered 2017-04-18: 50 ug via INTRAVENOUS
  Administered 2017-04-18: 100 ug via INTRAVENOUS
  Administered 2017-04-18 (×2): 50 ug via INTRAVENOUS

## 2017-04-18 MED ORDER — FENTANYL CITRATE (PF) 100 MCG/2ML IJ SOLN
25.0000 ug | INTRAMUSCULAR | Status: DC | PRN
  Administered 2017-04-18 (×2): 50 ug via INTRAVENOUS
  Filled 2017-04-18: qty 2

## 2017-04-18 MED ORDER — DEXAMETHASONE SODIUM PHOSPHATE 4 MG/ML IJ SOLN
4.0000 mg | Freq: Once | INTRAMUSCULAR | Status: AC
  Administered 2017-04-18: 4 mg via INTRAVENOUS

## 2017-04-18 MED ORDER — LIDOCAINE HCL (PF) 1 % IJ SOLN
INTRAMUSCULAR | Status: AC
  Filled 2017-04-18: qty 5

## 2017-04-18 MED ORDER — CEFAZOLIN SODIUM-DEXTROSE 1-4 GM/50ML-% IV SOLN
INTRAVENOUS | Status: AC
  Filled 2017-04-18: qty 50

## 2017-04-18 MED ORDER — EPHEDRINE SULFATE 50 MG/ML IJ SOLN
INTRAMUSCULAR | Status: DC | PRN
  Administered 2017-04-18 (×4): 10 mg via INTRAVENOUS

## 2017-04-18 MED ORDER — PHENYLEPHRINE 40 MCG/ML (10ML) SYRINGE FOR IV PUSH (FOR BLOOD PRESSURE SUPPORT)
PREFILLED_SYRINGE | INTRAVENOUS | Status: AC
  Filled 2017-04-18: qty 10

## 2017-04-18 MED ORDER — KETOROLAC TROMETHAMINE 30 MG/ML IJ SOLN
INTRAMUSCULAR | Status: AC
  Filled 2017-04-18: qty 1

## 2017-04-18 MED ORDER — CEFAZOLIN SODIUM-DEXTROSE 2-4 GM/100ML-% IV SOLN
INTRAVENOUS | Status: AC
  Filled 2017-04-18: qty 100

## 2017-04-18 MED ORDER — CHLORHEXIDINE GLUCONATE 4 % EX LIQD: 60.0000 mL | Freq: Once | CUTANEOUS | Status: DC

## 2017-04-18 MED ORDER — MIDAZOLAM HCL 2 MG/2ML IJ SOLN
1.0000 mg | INTRAMUSCULAR | Status: AC
  Administered 2017-04-18: 2 mg via INTRAVENOUS

## 2017-04-18 MED ORDER — IBUPROFEN 800 MG PO TABS
800.0000 mg | ORAL_TABLET | Freq: Once | ORAL | Status: AC
  Administered 2017-04-18: 800 mg via ORAL
  Filled 2017-04-18: qty 1

## 2017-04-18 MED ORDER — ONDANSETRON HCL 4 MG/2ML IJ SOLN
4.0000 mg | Freq: Once | INTRAMUSCULAR | Status: AC
  Administered 2017-04-18: 4 mg via INTRAVENOUS
  Filled 2017-04-18: qty 2

## 2017-04-18 MED ORDER — BUPIVACAINE-EPINEPHRINE (PF) 0.5% -1:200000 IJ SOLN
INTRAMUSCULAR | Status: AC
  Filled 2017-04-18: qty 60

## 2017-04-18 MED ORDER — KETOROLAC TROMETHAMINE 30 MG/ML IJ SOLN
30.0000 mg | Freq: Once | INTRAMUSCULAR | Status: AC
  Administered 2017-04-18: 30 mg via INTRAVENOUS
  Filled 2017-04-18: qty 1

## 2017-04-18 MED ORDER — SODIUM CHLORIDE 0.9 % IR SOLN
Status: DC | PRN
  Administered 2017-04-18: 1000 mL

## 2017-04-18 MED ORDER — MIDAZOLAM HCL 2 MG/2ML IJ SOLN
INTRAMUSCULAR | Status: AC
  Filled 2017-04-18: qty 2

## 2017-04-18 MED ORDER — CEFAZOLIN SODIUM 10 G IJ SOLR
3.0000 g | INTRAMUSCULAR | Status: AC
  Administered 2017-04-18: 1 g via INTRAVENOUS
  Administered 2017-04-18: 2 g via INTRAVENOUS
  Filled 2017-04-18: qty 3000

## 2017-04-18 SURGICAL SUPPLY — 50 items
BAG HAMPER (MISCELLANEOUS) ×2 IMPLANT
BANDAGE ELASTIC 6 LF NS (GAUZE/BANDAGES/DRESSINGS) ×2 IMPLANT
BLADE AGGRESSIVE PLUS 4.0 (BLADE) ×2 IMPLANT
BLADE SURG SZ11 CARB STEEL (BLADE) ×2 IMPLANT
BNDG CMPR MED 5X6 ELC HKLP NS (GAUZE/BANDAGES/DRESSINGS) ×1
CHLORAPREP W/TINT 26ML (MISCELLANEOUS) ×2 IMPLANT
CLOTH BEACON ORANGE TIMEOUT ST (SAFETY) ×2 IMPLANT
COOLER CRYO IC GRAV AND TUBE (ORTHOPEDIC SUPPLIES) ×2 IMPLANT
CUFF CRYO KNEE LG 20X31 COOLER (ORTHOPEDIC SUPPLIES) ×1 IMPLANT
CUFF TOURNIQUET SINGLE 34IN LL (TOURNIQUET CUFF) ×1 IMPLANT
CUTTER ANGLED DBL BITE 4.5 (BURR) ×1 IMPLANT
DECANTER SPIKE VIAL GLASS SM (MISCELLANEOUS) ×4 IMPLANT
GAUZE SPONGE 4X4 12PLY STRL (GAUZE/BANDAGES/DRESSINGS) ×2 IMPLANT
GAUZE SPONGE 4X4 16PLY XRAY LF (GAUZE/BANDAGES/DRESSINGS) ×2 IMPLANT
GAUZE XEROFORM 5X9 LF (GAUZE/BANDAGES/DRESSINGS) ×2 IMPLANT
GLOVE BIOGEL PI IND STRL 7.0 (GLOVE) ×1 IMPLANT
GLOVE BIOGEL PI INDICATOR 7.0 (GLOVE) ×2
GLOVE SKINSENSE NS SZ8.0 LF (GLOVE) ×1
GLOVE SKINSENSE STRL SZ8.0 LF (GLOVE) ×1 IMPLANT
GLOVE SS N UNI LF 8.5 STRL (GLOVE) ×2 IMPLANT
GOWN STRL REUS W/ TWL LRG LVL3 (GOWN DISPOSABLE) ×1 IMPLANT
GOWN STRL REUS W/TWL LRG LVL3 (GOWN DISPOSABLE) ×2
GOWN STRL REUS W/TWL XL LVL3 (GOWN DISPOSABLE) ×2 IMPLANT
HLDR LEG FOAM (MISCELLANEOUS) ×1 IMPLANT
IV NS IRRIG 3000ML ARTHROMATIC (IV SOLUTION) ×5 IMPLANT
KIT BLADEGUARD II DBL (SET/KITS/TRAYS/PACK) ×2 IMPLANT
KIT ROOM TURNOVER AP CYSTO (KITS) ×2 IMPLANT
LEG HOLDER FOAM (MISCELLANEOUS) ×1
MANIFOLD NEPTUNE II (INSTRUMENTS) ×2 IMPLANT
MARKER SKIN DUAL TIP RULER LAB (MISCELLANEOUS) ×2 IMPLANT
NDL HYPO 18GX1.5 BLUNT FILL (NEEDLE) ×1 IMPLANT
NDL HYPO 21X1.5 SAFETY (NEEDLE) ×1 IMPLANT
NDL SPNL 18GX3.5 QUINCKE PK (NEEDLE) ×1 IMPLANT
NEEDLE HYPO 18GX1.5 BLUNT FILL (NEEDLE) ×2 IMPLANT
NEEDLE HYPO 21X1.5 SAFETY (NEEDLE) ×2 IMPLANT
NEEDLE SPNL 18GX3.5 QUINCKE PK (NEEDLE) ×2 IMPLANT
NS IRRIG 1000ML POUR BTL (IV SOLUTION) ×2 IMPLANT
PACK ARTHRO LIMB DRAPE STRL (MISCELLANEOUS) ×2 IMPLANT
PAD ABD 5X9 TENDERSORB (GAUZE/BANDAGES/DRESSINGS) ×2 IMPLANT
PAD ARMBOARD 7.5X6 YLW CONV (MISCELLANEOUS) ×2 IMPLANT
PADDING CAST COTTON 6X4 STRL (CAST SUPPLIES) ×2 IMPLANT
PADDING WEBRIL 6 STERILE (GAUZE/BANDAGES/DRESSINGS) ×1 IMPLANT
PROBE BIPOLAR 50 DEGREE SUCT (MISCELLANEOUS) ×1 IMPLANT
SET ARTHROSCOPY INST (INSTRUMENTS) ×2 IMPLANT
SET BASIN LINEN APH (SET/KITS/TRAYS/PACK) ×2 IMPLANT
SUT ETHILON 3 0 FSL (SUTURE) ×2 IMPLANT
SYR 30ML LL (SYRINGE) ×2 IMPLANT
SYRINGE 10CC LL (SYRINGE) ×2 IMPLANT
TUBE CONNECTING 12X1/4 (SUCTIONS) ×4 IMPLANT
TUBING ARTHRO INFLOW-ONLY STRL (TUBING) ×2 IMPLANT

## 2017-04-18 NOTE — Interval H&P Note (Signed)
History and Physical Interval Note:  04/18/2017 7:27 AM  Carol Sharp  has presented today for surgery, with the diagnosis of medial meniscus tear right knee with osteoarthritis  The various methods of treatment have been discussed with the patient and family. After consideration of risks, benefits and other options for treatment, the patient has consented to  Procedure(s): KNEE ARTHROSCOPY WITH MEDIAL MENISECTOMY (Right) as a surgical intervention .  The patient's history has been reviewed, patient examined, no change in status, stable for surgery.  I have reviewed the patient's chart and labs.  Questions were answered to the patient's satisfaction.   IMPRESSION: Large radial tear just peripheral to the root of the posterior horn of the medial meniscus.   Moderate osteoarthritis about the knee appears worst in the patellofemoral and medial compartments.   Very small Baker's cyst.     CBC Latest Ref Rng & Units 04/16/2017 12/14/2016 06/18/2015  WBC 4.0 - 10.5 K/uL 10.2 9.1 9.2  Hemoglobin 12.0 - 15.0 g/dL 14.7 15.6 15.1  Hematocrit 36.0 - 46.0 % 43.8 45.4 45.8  Platelets 150 - 400 K/uL 348 377 325   BMP Latest Ref Rng & Units 04/16/2017 12/14/2016 06/18/2015  Glucose 65 - 99 mg/dL 90 93 96  BUN 6 - 20 mg/dL 16 13 10   Creatinine 0.44 - 1.00 mg/dL 0.82 0.75 0.66  BUN/Creat Ratio 9 - 23 - 17 15  Sodium 135 - 145 mmol/L 136 141 139  Potassium 3.5 - 5.1 mmol/L 3.3(L) 4.1 4.0  Chloride 101 - 111 mmol/L 101 103 99  CO2 22 - 32 mmol/L 25 21 22   Calcium 8.9 - 10.3 mg/dL 8.9 9.6 9.1    Electronically Signed   By: Inge Rise M.D.   On: 03/13/2017 08:43     Arther Abbott

## 2017-04-18 NOTE — Op Note (Signed)
04/18/2017  9:27 AM  PATIENT:  Carol Sharp  42 y.o. 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 The chart was reviewed and updated. The surgical site was confirmed as right knee and marked with an indelible marker.  The patient was taken to the operating room for anesthesia. After successful general anesthesia, 3 g Ancef was used as IV antibiotics.  The patient was placed in the supine position with the (right) the operative extremity in an arthroscopic leg holder and the opposite extremity in a padded leg holder.  The timeout was executed.  A lateral portal was established with an 11 blade and the scope was introduced into the joint. A diagnostic arthroscopy was performed in circumferential manner examining the entire knee joint. A medial portal was established and the diagnostic arthroscopy was repeated using a probe to palpate intra-articular structures as they were encountered.    The medial meniscus was resected using a duckbill forceps. The meniscal fragments were removed with a motorized shaver. The meniscus was balanced with a combination of a motorized shaver and a 50 ConMed Linvatec wand until a stable rim was  obtained.  Synovial resection was done as needed for visualization and where it was thought that excessive synovial tissue would irritate the joint  The arthroscopic pump was placed on the wash mode and any excess debris was removed from the joint using suction.  60 cc of Marcaine with epinephrine was injected through the arthroscope.  The portals were closed with 3-0 nylon suture.  A sterile bandage, Ace wrap and Cryo/Cuff was placed and the Cryo/Cuff was activated. The patient was taken to the recovery room in stable condition.  PHYSICIAN ASSISTANT:   ASSISTANTS: none   ANESTHESIA:   general  EBL:  0 mL   BLOOD ADMINISTERED:none  DRAINS: none   LOCAL MEDICATIONS USED:  MARCAINE     SPECIMEN:  No Specimen  DISPOSITION OF SPECIMEN:  N/A  COUNTS:  YES  TOURNIQUET:  * Missing tourniquet times found for documented tourniquets in log: [id number] *  DICTATION: .Viviann Spare Dictation  PLAN OF CARE: Discharge to home after PACU  PATIENT DISPOSITION:  PACU - hemodynamically stable.   Delay start of Pharmacological VTE agent (>24hrs) due to surgical blood loss or risk of bleeding: not applicable

## 2017-04-18 NOTE — Anesthesia Preprocedure Evaluation (Signed)
Anesthesia Evaluation  Patient identified by MRN, date of birth, ID band Patient awake    Reviewed: Allergy & Precautions, NPO status , Patient's Chart, lab work & pertinent test results  Airway Mallampati: II  TM Distance: >3 FB Neck ROM: Full    Dental  (+) Teeth Intact   Pulmonary Current Smoker,    breath sounds clear to auscultation       Cardiovascular hypertension, Pt. on medications  Rhythm:Regular Rate:Normal     Neuro/Psych PSYCHIATRIC DISORDERS Anxiety Depression negative neurological ROS     GI/Hepatic   Endo/Other    Renal/GU      Musculoskeletal   Abdominal   Peds  Hematology   Anesthesia Other Findings   Reproductive/Obstetrics                             Anesthesia Physical Anesthesia Plan  ASA: II  Anesthesia Plan: General   Post-op Pain Management:    Induction: Intravenous  PONV Risk Score and Plan:   Airway Management Planned: LMA  Additional Equipment:   Intra-op Plan:   Post-operative Plan: Extubation in OR  Informed Consent: I have reviewed the patients History and Physical, chart, labs and discussed the procedure including the risks, benefits and alternatives for the proposed anesthesia with the patient or authorized representative who has indicated his/her understanding and acceptance.     Plan Discussed with:   Anesthesia Plan Comments: (Possible GOT discussed and pt agrees.)        Anesthesia Quick Evaluation

## 2017-04-18 NOTE — Transfer of Care (Signed)
Immediate Anesthesia Transfer of Care Note  Patient: [name]  Procedure(s) Performed: RIGHT KNEE ARTHROSCOPY WITH PARTIAL  MEDIAL MENISECTOMY (Right Knee)  Patient Location: PACU  Anesthesia Type:General  Level of Consciousness: awake, alert  and patient cooperative  Airway & Oxygen Therapy: Patient Spontanous Breathing and Patient connected to nasal cannula oxygen  Post-op Assessment: Report given to RN and Post -op Vital signs reviewed and stable  Post vital signs: Reviewed and stable  Last Vitals:  Vitals:   04/18/17 0825 04/18/17 0830  BP: 115/74   Pulse:    Resp: (!) 22 20  Temp:    SpO2: 96% 97%    Last Pain:  Vitals:   04/18/17 0725  TempSrc: Oral  PainSc: 5       Patients Stated Pain Goal: 3 (12/14/83 2778)  Complications: No apparent anesthesia complications

## 2017-04-18 NOTE — Anesthesia Postprocedure Evaluation (Signed)
Anesthesia Post Note  Patient: Carol Sharp  Procedure(s) Performed: RIGHT KNEE ARTHROSCOPY WITH PARTIAL  MEDIAL MENISECTOMY (Right Knee)  Patient location during evaluation: Short Stay Anesthesia Type: General Level of consciousness: awake and alert and patient cooperative Pain management: satisfactory to patient Vital Signs Assessment: post-procedure vital signs reviewed and stable Respiratory status: spontaneous breathing Cardiovascular status: stable Postop Assessment: no apparent nausea or vomiting Anesthetic complications: no     Last Vitals:  Vitals:   04/18/17 1000 04/18/17 1015  BP: 123/87 124/79  Pulse: 79 76  Resp: 15 15  Temp:    SpO2: 95% 95%    Last Pain:  Vitals:   04/18/17 1015  TempSrc:   PainSc: 5                  Sparsh Callens

## 2017-04-18 NOTE — Discharge Instructions (Signed)
Remove the Cryo/Cuff when you are walking  Remove the dressing tomorrow, apply band-aids  Start knee exercises by bending the knee 253 times a day on the second day after surgery  Apply weight to the operative knee and leg as tolerated   PATIENT INSTRUCTIONS POST-ANESTHESIA  IMMEDIATELY FOLLOWING SURGERY:  Do not drive or operate machinery for the first twenty four hours after surgery.  Do not make any important decisions for twenty four hours after surgery or while taking narcotic pain medications or sedatives.  If you develop intractable nausea and vomiting or a severe headache please notify your doctor immediately.  FOLLOW-UP:  Please make an appointment with your surgeon as instructed. You do not need to follow up with anesthesia unless specifically instructed to do so.  WOUND CARE INSTRUCTIONS (if applicable):  Keep a dry clean dressing on the anesthesia/puncture wound site if there is drainage.  Once the wound has quit draining you may leave it open to air.  Generally you should leave the bandage intact for twenty four hours unless there is drainage.  If the epidural site drains for more than 36-48 hours please call the anesthesia department.  QUESTIONS?:  Please feel free to call your physician or the hospital operator if you have any questions, and they will be happy to assist you.        Knee Arthroscopy, Care After Refer to this sheet in the next few weeks. These instructions provide you with information about caring for yourself after your procedure. Your health care provider may also give you more specific instructions. Your treatment has been planned according to current medical practices, but problems sometimes occur. Call your health care provider if you have any problems or questions after your procedure. What can I expect after the procedure? After the procedure, it is common to have:  Soreness.  Pain.  Follow these instructions at home: Bathing  Do not take  baths, swim, or use a hot tub until your health care provider approves. Incision care  There are many different ways to close and cover an incision, including stitches, skin glue, and adhesive strips. Follow your health care providers instructions about: ? Incision care. ? Bandage (dressing) changes and removal. ? Incision closure removal.  Check your incision area every day for signs of infection. Watch for: ? Redness, swelling, or pain. ? Fluid, blood, or pus. Activity  Avoid strenuous activities for as long as directed by your health care provider.  Return to your normal activities as directed by your health care provider. Ask your health care provider what activities are safe for you.  Perform range-of-motion exercises only as directed by your health care provider.  Do not lift anything that is heavier than 10 lb (4.5 kg).  Do not drive or operate heavy machinery while taking pain medicine.  If you were given crutches, use them as directed by your health care provider. Managing pain, stiffness, and swelling  If directed, apply ice to the injured area: ? Put ice in a plastic bag. ? Place a towel between your skin and the bag. ? Leave the ice on for 20 minutes, 2-3 times per day.  Raise the injured area above the level of your heart while you are sitting or lying down as directed by your health care provider. General instructions  Keep all follow-up visits as directed by your health care provider. This is important.  Take medicines only as directed by your health care provider.  Do not use any tobacco  products, including cigarettes, chewing tobacco, or electronic cigarettes. If you need help quitting, ask your health care provider.  If you were given compression stockings, wear them as directed by your health care provider. These stockings help prevent blood clots and reduce swelling in your legs. Contact a health care provider if:  You have severe pain with any movement  of your knee.  You notice a bad smell coming from the incision or dressing.  You have redness, swelling, or pain at the site of your incision.  You have fluid, blood, or pus coming from your incision. Get help right away if:  You develop a rash.  You have a fever.  You have difficulty breathing or have shortness of breath.  You develop pain in your calves or in the back of your knee.  You develop chest pain.  You develop numbness or tingling in your leg or foot. This information is not intended to replace advice given to you by your health care provider. Make sure you discuss any questions you have with your health care provider. Document Released: 08/25/2004 Document Revised: 07/08/2015 Document Reviewed: 02/01/2014 Elsevier Interactive Patient Education  [employer].

## 2017-04-18 NOTE — Brief Op Note (Signed)
04/18/2017  9:27 AM  PATIENT:  Carol Sharp  42 y.o. 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  Mild synovitis   PROCEDURE:  Procedure(s): RIGHT KNEE ARTHROSCOPY WITH PARTIAL  MEDIAL MENISECTOMY (Right) - 16109  SURGEON:  Surgeon(s) and Role:    Carole Civil, MD - Primary  PHYSICIAN ASSISTANT:   ASSISTANTS: none   ANESTHESIA:   general  EBL:  0 mL   BLOOD ADMINISTERED:none  DRAINS: none   LOCAL MEDICATIONS USED:  MARCAINE     SPECIMEN:  No Specimen  DISPOSITION OF SPECIMEN:  N/A  COUNTS:  YES  TOURNIQUET:  * Missing tourniquet times found for documented tourniquets in log: [id number] *  DICTATION: .Viviann Spare Dictation  PLAN OF CARE: Discharge to home after PACU  PATIENT DISPOSITION:  PACU - hemodynamically stable.   Delay start of Pharmacological VTE agent (>24hrs) due to surgical blood loss or risk of bleeding: not applicable

## 2017-04-18 NOTE — Anesthesia Procedure Notes (Signed)
Procedure Name: LMA Insertion Date/Time: 04/18/2017 8:41 AM Performed by: Vista Deck, CRNA Pre-anesthesia Checklist: Patient identified, Patient being monitored, Emergency Drugs available, Timeout performed and Suction available Patient Re-evaluated:Patient Re-evaluated prior to induction Oxygen Delivery Method: Circle System Utilized Preoxygenation: Pre-oxygenation with 100% oxygen Induction Type: IV induction Ventilation: Mask ventilation without difficulty LMA: LMA inserted LMA Size: 3.0 Number of attempts: 1 Placement Confirmation: positive ETCO2 and breath sounds checked- equal and bilateral Tube secured with: Tape

## 2017-04-19 ENCOUNTER — Encounter (HOSPITAL_COMMUNITY): Payer: Self-pay | Admitting: Orthopedic Surgery

## 2017-04-23 ENCOUNTER — Encounter: Payer: Self-pay | Admitting: Orthopedic Surgery

## 2017-04-23 ENCOUNTER — Ambulatory Visit (INDEPENDENT_AMBULATORY_CARE_PROVIDER_SITE_OTHER): Payer: 59 | Admitting: Orthopedic Surgery

## 2017-04-23 VITALS — BP 154/101 | HR 115 | Ht 69.5 in | Wt 284.0 lb

## 2017-04-23 DIAGNOSIS — Z9889 Other specified postprocedural states: Secondary | ICD-10-CM

## 2017-04-23 MED ORDER — HYDROCODONE-ACETAMINOPHEN 10-325 MG PO TABS: 1.0000 | ORAL_TABLET | ORAL | 0 refills | Status: DC | PRN

## 2017-04-23 NOTE — Progress Notes (Signed)
POST OP VISIT   Patient ID: Carol Sharp, female   DOB: 07/07/1975, 42 y.o.   MRN: [id number]  Chief Complaint  Patient presents with  . Post-op Follow-up    s/p right knee arthroscopy 04/18/17    Encounter Diagnosis  Name Primary?  . 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  Her knee looks good she is got bruising and ecchymosis in the portal sites  No purulent drainage is noted  Recommend continue exercises she is gotten out of work note to cover her for 4 weeks from surgery I will follow-up with her on the 27th she will get a new refill on her medication  I reviewed her arthroscopy pictures with her and emphasized the arthritis that was seen  Databank checked

## 2017-04-23 NOTE — Patient Instructions (Signed)
Continue ice 3-4 times a day or more as needed depending on swelling  Continue the knee exercises 3 times a day more if tolerable work on stretching the back of the leg out

## 2017-04-24 ENCOUNTER — Encounter: Payer: Self-pay | Admitting: Orthopedic Surgery

## 2017-05-15 ENCOUNTER — Encounter: Payer: Self-pay | Admitting: Orthopedic Surgery

## 2017-05-15 ENCOUNTER — Ambulatory Visit (INDEPENDENT_AMBULATORY_CARE_PROVIDER_SITE_OTHER): Payer: 59 | Admitting: Orthopedic Surgery

## 2017-05-15 VITALS — BP 158/92 | HR 74 | Ht 69.5 in | Wt 284.0 lb

## 2017-05-15 DIAGNOSIS — Z9889 Other specified postprocedural states: Secondary | ICD-10-CM

## 2017-05-15 NOTE — Progress Notes (Signed)
POSTOP VISIT  Status post right knee scope  POD #27  BP (!) 158/92   Pulse 74   Ht 5' 9.5" (1.765 m)   Wt 284 lb (128.8 kg)   BMI 41.34 kg/m   Encounter Diagnosis  Name Primary?  . S/P arthroscopy of right knee 04/18/17 Yes   Soreness front of the knee occasionally lateral side of the leg  The surgical site incision clean dry and intact with no drainage  The neurovascular examination of the extremity is normal in terms of sensation pulse and perfusion Range of motion 0-125 with good straight leg raise   Postoperative plan  She will be seeing [place] on an intermittent basis depending on her symptoms as her knees show significant arthritis  We discussed possible knee replacement if she can get to 42 years old  PRE-OPERATIVE DIAGNOSIS:  medial meniscus tear right knee with osteoarthritis   POST-OPERATIVE DIAGNOSIS: Torn medial meniscus right knee with osteoarthritis right knee   Operative findings: Posterior horn root tear medial meniscus grade III chondromalacia tibial plateau and medial femoral condyle   Notch: ACL PCL intact   Lateral compartment grade 2 chondral lesion lateral femoral condyle, lateral meniscus intact   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

## 2017-05-15 NOTE — Patient Instructions (Signed)
Return to work April 1 full duty no restrictions

## 2017-05-21 ENCOUNTER — Other Ambulatory Visit: Payer: Self-pay | Admitting: Family Medicine

## 2017-05-23 ENCOUNTER — Telehealth: Payer: Self-pay | Admitting: Orthopedic Surgery

## 2017-05-23 NOTE — Telephone Encounter (Signed)
Of course!

## 2017-05-23 NOTE — Telephone Encounter (Signed)
Carol Sharp was given a return to full duty work note on her last visit on 05/15/17.    She said that she had to do a drug test in order to return to work.  She states she took the drug test on 05/16/17 but the results did not get returned to her work place until Wednesday, 05/22/17.    She wants to know if you would extend her work note from the original date of 05/20/17 until 05/23/17?  Please advise  Thanks

## 2017-05-24 ENCOUNTER — Encounter: Payer: Self-pay | Admitting: Orthopedic Surgery

## 2017-06-14 ENCOUNTER — Other Ambulatory Visit: Payer: Self-pay | Admitting: Family Medicine

## 2017-06-14 ENCOUNTER — Other Ambulatory Visit: Payer: Self-pay | Admitting: Nurse Practitioner

## 2017-06-14 ENCOUNTER — Encounter: Payer: Self-pay | Admitting: Nurse Practitioner

## 2017-06-17 NOTE — Telephone Encounter (Signed)
May have this +1 refill will need office visit before further

## 2017-06-28 ENCOUNTER — Other Ambulatory Visit: Payer: Self-pay | Admitting: Nurse Practitioner

## 2017-06-28 ENCOUNTER — Telehealth: Payer: Self-pay | Admitting: Nurse Practitioner

## 2017-06-28 DIAGNOSIS — E876 Hypokalemia: Secondary | ICD-10-CM

## 2017-06-28 NOTE — Telephone Encounter (Signed)
Left message to return call 

## 2017-06-28 NOTE — Telephone Encounter (Signed)
Pt returned call; lab order placed in Epic.Pt verbalized understanding

## 2017-06-28 NOTE — Telephone Encounter (Signed)
Nurses: I sent this patient a my chart message on 4/26 that has not been read. Please see a copy below:  Alyssia, I was going to refill your fluid pill. Do you take it everyday? I noticed on your labs from February that your potassium as a little low? Did anyone talk to you about that?   Please contact her to get information. Concerned about her potassium. Thanks.

## 2017-06-28 NOTE — Telephone Encounter (Signed)
It was low on Dr. Ruthe Mannan labs back in February. Recommend repeating potassium to see if it has come back up especially with taking fluid pill.

## 2017-06-28 NOTE — Progress Notes (Unsigned)
pot

## 2017-06-28 NOTE — Telephone Encounter (Signed)
Discussed with pt. Pt states she does take hctz every day. And noone has told her that her potassium was low.

## 2017-07-05 DIAGNOSIS — E876 Hypokalemia: Secondary | ICD-10-CM | POA: Diagnosis not present

## 2017-07-06 LAB — POTASSIUM: Potassium: 3.5 mmol/L (ref 3.5–5.2)

## 2017-07-15 ENCOUNTER — Other Ambulatory Visit: Payer: Self-pay | Admitting: Family Medicine

## 2017-07-18 ENCOUNTER — Telehealth: Payer: Self-pay | Admitting: Orthopedic Surgery

## 2017-07-18 NOTE — Telephone Encounter (Signed)
Patient requests refill on Hydrocodone/Acetaminophen 10-325 mgs.   Qty  42  Sig: Take 1 tablet by mouth every 4 (four) hours as needed.  Patient states she uses [facility]

## 2017-07-19 NOTE — Telephone Encounter (Signed)
S/P arthroscopy of right knee 04/18/17  Patient requesting hydrocodone 10/325  Do you want to continue 10/325?

## 2017-07-19 NOTE — Telephone Encounter (Signed)
Patient called back.  I advised her of what Dr. Aline Brochure said in regards to her pain medication request

## 2017-07-19 NOTE — Telephone Encounter (Signed)
TYLENOL OR ADVIL IS ALL SHE CAN HAVE

## 2017-07-22 ENCOUNTER — Ambulatory Visit: Payer: 59 | Admitting: Family Medicine

## 2017-07-22 ENCOUNTER — Encounter: Payer: Self-pay | Admitting: Family Medicine

## 2017-07-22 VITALS — BP 112/78 | Temp 98.3°F | Ht 69.5 in | Wt 288.8 lb

## 2017-07-22 DIAGNOSIS — R058 Other specified cough: Secondary | ICD-10-CM

## 2017-07-22 DIAGNOSIS — I10 Essential (primary) hypertension: Secondary | ICD-10-CM

## 2017-07-22 DIAGNOSIS — M79672 Pain in left foot: Secondary | ICD-10-CM | POA: Diagnosis not present

## 2017-07-22 DIAGNOSIS — M722 Plantar fascial fibromatosis: Secondary | ICD-10-CM | POA: Diagnosis not present

## 2017-07-22 DIAGNOSIS — R05 Cough: Secondary | ICD-10-CM

## 2017-07-22 DIAGNOSIS — T464X5A Adverse effect of angiotensin-converting-enzyme inhibitors, initial encounter: Secondary | ICD-10-CM

## 2017-07-22 DIAGNOSIS — M7730 Calcaneal spur, unspecified foot: Secondary | ICD-10-CM | POA: Diagnosis not present

## 2017-07-22 MED ORDER — ALPRAZOLAM 1 MG PO TABS: ORAL_TABLET | ORAL | 5 refills | Status: DC

## 2017-07-22 MED ORDER — POTASSIUM CHLORIDE CRYS ER 20 MEQ PO TBCR: 20.0000 meq | EXTENDED_RELEASE_TABLET | Freq: Every day | ORAL | 5 refills | Status: DC

## 2017-07-22 MED ORDER — DULOXETINE HCL 30 MG PO CPEP: 30.0000 mg | ORAL_CAPSULE | Freq: Every day | ORAL | 2 refills | Status: DC

## 2017-07-22 MED ORDER — INDAPAMIDE 2.5 MG PO TABS: 2.5000 mg | ORAL_TABLET | Freq: Every day | ORAL | 5 refills | Status: DC

## 2017-07-22 NOTE — Progress Notes (Signed)
   Subjective:    Patient ID: Carol Sharp, female    DOB: April 27, 1975, 42 y.o.   MRN: [id number]  Cough  This is a new problem. The current episode started 1 to 4 weeks ago. Associated symptoms include headaches and nasal congestion. Pertinent negatives include no chest pain, fever or shortness of breath. She has tried OTC cough suppressant for the symptoms.   She does have underlying high blood pressure she takes lisinopril she wonders if it could be causing cough she denies high fever chills sweats sinus pressure pain drainage but she does get intermittent headaches.  She also states depression is doing well.  Denies abusing her Xanax.  Does suffer with a lot of swelling in the lower legs intermittently.  Is trying to lose weight   Review of Systems  Constitutional: Negative for activity change, fatigue and fever.  HENT: Negative for congestion.   Respiratory: Positive for cough. Negative for chest tightness and shortness of breath.   Cardiovascular: Negative for chest pain and leg swelling.  Gastrointestinal: Negative for abdominal pain.  Skin: Negative for color change.  Neurological: Positive for headaches.  Psychiatric/Behavioral: Negative for behavioral problems.       Objective:   Physical Exam  Constitutional: She appears well-nourished. No distress.  Cardiovascular: Normal rate, regular rhythm and normal heart sounds.  No murmur heard. Pulmonary/Chest: Effort normal and breath sounds normal. No respiratory distress.  Musculoskeletal: She exhibits no edema.  Lymphadenopathy:    She has no cervical adenopathy.  Neurological: She is alert. She exhibits normal muscle tone.  Psychiatric: Her behavior is normal.  Vitals reviewed.         Assessment & Plan:  Frequent coughing Probably related to either allergies or lisinopril Stop lisinopril see if the cough does not get better over the next 2 weeks If progressive troubles or if worse to follow-up  Based on the  above stop lisinopril.  Stop HCTZ.  Use indapamide 2.5 mg every morning.  Take potassium 20 M EQ with it.  Check lab work in 8 weeks.  Continue amlodipine.  Refills of Xanax given Patient states Cymbalta is helping her depression well She will do lab work in 2 months before follow-up office visit she will also work hard at trying to lose weight

## 2017-09-02 DIAGNOSIS — M79672 Pain in left foot: Secondary | ICD-10-CM | POA: Diagnosis not present

## 2017-09-02 DIAGNOSIS — M7730 Calcaneal spur, unspecified foot: Secondary | ICD-10-CM | POA: Diagnosis not present

## 2017-09-02 DIAGNOSIS — M722 Plantar fascial fibromatosis: Secondary | ICD-10-CM | POA: Diagnosis not present

## 2017-10-23 DIAGNOSIS — L821 Other seborrheic keratosis: Secondary | ICD-10-CM | POA: Diagnosis not present

## 2017-10-23 DIAGNOSIS — L738 Other specified follicular disorders: Secondary | ICD-10-CM | POA: Diagnosis not present

## 2017-10-23 DIAGNOSIS — L918 Other hypertrophic disorders of the skin: Secondary | ICD-10-CM | POA: Diagnosis not present

## 2017-10-23 DIAGNOSIS — B078 Other viral warts: Secondary | ICD-10-CM | POA: Diagnosis not present

## 2017-10-29 ENCOUNTER — Ambulatory Visit (INDEPENDENT_AMBULATORY_CARE_PROVIDER_SITE_OTHER): Payer: 59 | Admitting: Advanced Practice Midwife

## 2017-10-29 ENCOUNTER — Encounter: Payer: Self-pay | Admitting: Advanced Practice Midwife

## 2017-10-29 ENCOUNTER — Other Ambulatory Visit (HOSPITAL_COMMUNITY)
Admission: RE | Admit: 2017-10-29 | Discharge: 2017-10-29 | Disposition: A | Discharge status: Home / Self Care | Payer: 59 | Source: Ambulatory Visit | Attending: Advanced Practice Midwife | Admitting: Advanced Practice Midwife

## 2017-10-29 VITALS — BP 139/96 | HR 86 | Ht 69.2 in | Wt 287.2 lb

## 2017-10-29 DIAGNOSIS — Z01419 Encounter for gynecological examination (general) (routine) without abnormal findings: Secondary | ICD-10-CM | POA: Diagnosis present

## 2017-10-29 NOTE — Progress Notes (Signed)
Carol Sharp 42 y.o.  Vitals:   10/29/17 1429  BP: (!) 139/96  Pulse: 86     Filed Weights   10/29/17 1429  Weight: 287 lb 3.2 oz (130.3 kg)    Past Medical History: Past Medical History:  Diagnosis Date  . Anxiety   . Arthritis   . Breast nodule 08/02/2015  . Depression   . Hypertension   . MRSA cellulitis May 2005    Past Surgical History: Past Surgical History:  Procedure Laterality Date  . ABLATION  May 2010  . DILATION AND CURETTAGE OF UTERUS  Oct 2000  . KNEE ARTHROSCOPY WITH MEDIAL MENISECTOMY Right 04/18/2017   Procedure: RIGHT KNEE ARTHROSCOPY WITH PARTIAL  MEDIAL MENISECTOMY;  Surgeon: Carole Civil, MD;  Location: [facility];  Service: Orthopedics;  Laterality: Right;  . TONSILLECTOMY  1981?  . TUBAL LIGATION      Family History: Family History  Problem Relation Age of Onset  . Hypertension Mother   . Diabetes Mother   . Hyperlipidemia Mother   . Thyroid disease Mother   . Seizures Mother   . Hyperlipidemia Father   . Cancer Paternal Aunt        breast  . Cancer Maternal Grandmother        colon  . Cancer Paternal Grandmother        breast  . Cancer Paternal Aunt        breast  . Cancer Paternal Aunt        breast    Social History: Social History   Tobacco Use  . Smoking status: Current Some Day Smoker    Packs/day: 0.50    Years: 15.00    Pack years: 7.50    Types: Cigarettes  . Smokeless tobacco: Never Used  Substance Use Topics  . Alcohol use: Yes    Comment: occ  . Drug use: No    Allergies: No Known Allergies    Current Outpatient Medications:  .  ALPRAZolam (XANAX) 1 MG tablet, TAKE 1/2 TO 1 TABLET BY MOUTH AT BEDTIME AS NEEDED FOR INSOMNIA., Disp: 30 tablet, Rfl: 5 .  amLODipine (NORVASC) 5 MG tablet, Take 1 tablet (5 mg total) by mouth daily., Disp: 30 tablet, Rfl: 11 .  DULoxetine (CYMBALTA) 30 MG capsule, Take 1 capsule (30 mg total) by mouth daily., Disp: 30 capsule, Rfl: 2 .  indapamide (LOZOL) 2.5 MG tablet,  Take 1 tablet (2.5 mg total) by mouth daily., Disp: 30 tablet, Rfl: 5 .  potassium chloride SA (K-DUR,KLOR-CON) 20 MEQ tablet, Take 1 tablet (20 mEq total) by mouth daily., Disp: 30 tablet, Rfl: 5  History of Present Illness: Here for pap andphysical Last pap 6/17, normal.  Had endo ablation/BTL/ spots rarely q 3 m onths or so. Notices a fishy smell at that time.    Review of Systems   Patient denies any headaches, blurred vision, shortness of breath, chest pain, abdominal pain, problems with bowel movements, urination, or intercourse.   Physical Exam: General:  Well developed, well nourished, no acute distress Skin:  Warm and dry Neck:  Midline trachea, normal thyroid Lungs; Clear to auscultation bilaterally Breast:  No dominant palpable mass, retraction, or nipple discharge Cardiovascular: Regular rate and rhythm Abdomen:  Soft, non tender, no hepatosplenomegaly Pelvic:  External genitalia is normal in appearance.  The vagina is normal in appearance. DC looks normal, no odor. The cervix is bulbous.  Uterus is felt to be normal size, shape, and contour.  No adnexal  masses or tenderness noted. Exam limited by habitus Extremities:  No swelling or varicosities noted Psych:  No mood changes.     Impression: normal gyn exam     Plan: if pap normal, repeat 3  Years Mammogram recommended Try vaginal probioitc to help maintain balance ofvagina/intermittent odor

## 2017-10-29 NOTE — Patient Instructions (Signed)
Vaginal probiotic (oral or vaginal cream)

## 2017-10-31 LAB — CYTOLOGY - PAP
Diagnosis: NEGATIVE
HPV: NOT DETECTED

## 2017-12-25 ENCOUNTER — Encounter: Payer: Self-pay | Admitting: Family Medicine

## 2017-12-25 ENCOUNTER — Ambulatory Visit: Payer: 59 | Admitting: Family Medicine

## 2017-12-25 VITALS — BP 132/102 | Temp 98.1°F | Ht 69.5 in | Wt 298.0 lb

## 2017-12-25 DIAGNOSIS — I10 Essential (primary) hypertension: Secondary | ICD-10-CM

## 2017-12-25 DIAGNOSIS — J019 Acute sinusitis, unspecified: Secondary | ICD-10-CM | POA: Diagnosis not present

## 2017-12-25 MED ORDER — TRIAMTERENE-HCTZ 37.5-25 MG PO CAPS: 1.0000 | ORAL_CAPSULE | Freq: Every day | ORAL | 5 refills | Status: DC

## 2017-12-25 MED ORDER — CEFPROZIL 500 MG PO TABS: 500.0000 mg | ORAL_TABLET | Freq: Two times a day (BID) | ORAL | 0 refills | Status: DC

## 2017-12-25 NOTE — Progress Notes (Signed)
   Subjective:    Patient ID: Carol Sharp, female    DOB: 1975-08-14, 42 y.o.   MRN: [id number]  HPI Patient is here today with complaints of chest tightness,cough diarrhea,headache,sore throat all of this has been on going for the last three days. She has been taking Advil.  Significant head congestion drainage coughing chest congestion some tightness in the lungs as well Review of Systems  Constitutional: Negative for activity change and fever.  HENT: Positive for congestion and rhinorrhea. Negative for ear pain.   Eyes: Negative for discharge.  Respiratory: Positive for cough and shortness of breath. Negative for wheezing.   Cardiovascular: Negative for chest pain.  Gastrointestinal: Positive for abdominal pain and diarrhea.       Objective:   Physical Exam  Constitutional: She appears well-developed.  HENT:  Head: Normocephalic.  Nose: Nose normal.  Mouth/Throat: Oropharynx is clear and moist. No oropharyngeal exudate.  Neck: Neck supple.  Cardiovascular: Normal rate and normal heart sounds.  No murmur heard. Pulmonary/Chest: Effort normal and breath sounds normal. She has no wheezes.  Lymphadenopathy:    She has no cervical adenopathy.  Skin: Skin is warm and dry.  Nursing note and vitals reviewed.   On physical exam I do not find evidence of excessive fluid much of what is going on is her weight-very sympathetically we chatted about healthy eating regular physical activity and how gastric surgery can allow for better weight reduction if all other measures fail      Assessment & Plan:  Viral syndrome Secondary rhinosinusitis Antibiotic prescribed warnings discussed follow-up if problems  HTN poor control patient feels like the medicine is not getting the fluid off Stop indapamide stop potassium Start Dyazide 37-25, 1 daily Check metabolic 7 in 3 weeks  Healthy eating regular physical activity

## 2017-12-25 NOTE — Patient Instructions (Addendum)
DASH Eating Plan DASH stands for "Dietary Approaches to Stop Hypertension." The DASH eating plan is a healthy eating plan that has been shown to reduce high blood pressure (hypertension). It may also reduce your risk for type 2 diabetes, heart disease, and stroke. The DASH eating plan may also help with weight loss. What are tips for following this plan? General guidelines  Avoid eating more than 2,300 mg (milligrams) of salt (sodium) a day. If you have hypertension, you may need to reduce your sodium intake to 1,500 mg a day.  Limit alcohol intake to no more than 1 drink a day for nonpregnant women and 2 drinks a day for men. One drink equals 12 oz of beer, 5 oz of wine, or 1 oz of hard liquor.  Work with your health care provider to maintain a healthy body weight or to lose weight. Ask what an ideal weight is for you.  Get at least 30 minutes of exercise that causes your heart to beat faster (aerobic exercise) most days of the week. Activities may include walking, swimming, or biking.  Work with your health care provider or diet and nutrition specialist (dietitian) to adjust your eating plan to your individual calorie needs. Reading food labels  Check food labels for the amount of sodium per serving. Choose foods with less than 5 percent of the Daily Value of sodium. Generally, foods with less than 300 mg of sodium per serving fit into this eating plan.  To find whole grains, look for the word "whole" as the first word in the ingredient list. Shopping  Buy products labeled as "low-sodium" or "no salt added."  Buy fresh foods. Avoid canned foods and premade or frozen meals. Cooking  Avoid adding salt when cooking. Use salt-free seasonings or herbs instead of table salt or sea salt. Check with your health care provider or pharmacist before using salt substitutes.  Do not fry foods. Cook foods using healthy methods such as baking, boiling, grilling, and broiling instead.  Cook with  heart-healthy oils, such as olive, canola, soybean, or sunflower oil. Meal planning   Eat a balanced diet that includes: ? 5 or more servings of fruits and vegetables each day. At each meal, try to fill half of your plate with fruits and vegetables. ? Up to 6-8 servings of whole grains each day. ? Less than 6 oz of lean meat, poultry, or fish each day. A 3-oz serving of meat is about the same size as a deck of cards. One egg equals 1 oz. ? 2 servings of low-fat dairy each day. ? A serving of nuts, seeds, or beans 5 times each week. ? Heart-healthy fats. Healthy fats called Omega-3 fatty acids are found in foods such as flaxseeds and coldwater fish, like sardines, salmon, and mackerel.  Limit how much you eat of the following: ? Canned or prepackaged foods. ? Food that is high in trans fat, such as fried foods. ? Food that is high in saturated fat, such as fatty meat. ? Sweets, desserts, sugary drinks, and other foods with added sugar. ? Full-fat dairy products.  Do not salt foods before eating.  Try to eat at least 2 vegetarian meals each week.  Eat more home-cooked food and less restaurant, buffet, and fast food.  When eating at a restaurant, ask that your food be prepared with less salt or no salt, if possible. What foods are recommended? The items listed may not be a complete list. Talk with your dietitian about what   dietary choices are best for you. Grains Whole-grain or whole-wheat bread. Whole-grain or whole-wheat pasta. Brown rice. Oatmeal. Quinoa. Bulgur. Whole-grain and low-sodium cereals. Pita bread. Low-fat, low-sodium crackers. Whole-wheat flour tortillas. Vegetables Fresh or frozen vegetables (raw, steamed, roasted, or grilled). Low-sodium or reduced-sodium tomato and vegetable juice. Low-sodium or reduced-sodium tomato sauce and tomato paste. Low-sodium or reduced-sodium canned vegetables. Fruits All fresh, dried, or frozen fruit. Canned fruit in natural juice (without  added sugar). Meat and other protein foods Skinless chicken or turkey. Ground chicken or turkey. Pork with fat trimmed off. Fish and seafood. Egg whites. Dried beans, peas, or lentils. Unsalted nuts, nut butters, and seeds. Unsalted canned beans. Lean cuts of beef with fat trimmed off. Low-sodium, lean deli meat. Dairy Low-fat (1%) or fat-free (skim) milk. Fat-free, low-fat, or reduced-fat cheeses. Nonfat, low-sodium ricotta or cottage cheese. Low-fat or nonfat yogurt. Low-fat, low-sodium cheese. Fats and oils Soft margarine without trans fats. Vegetable oil. Low-fat, reduced-fat, or light mayonnaise and salad dressings (reduced-sodium). Canola, safflower, olive, soybean, and sunflower oils. Avocado. Seasoning and other foods Herbs. Spices. Seasoning mixes without salt. Unsalted popcorn and pretzels. Fat-free sweets. What foods are not recommended? The items listed may not be a complete list. Talk with your dietitian about what dietary choices are best for you. Grains Baked goods made with fat, such as croissants, muffins, or some breads. Dry pasta or rice meal packs. Vegetables Creamed or fried vegetables. Vegetables in a cheese sauce. Regular canned vegetables (not low-sodium or reduced-sodium). Regular canned tomato sauce and paste (not low-sodium or reduced-sodium). Regular tomato and vegetable juice (not low-sodium or reduced-sodium). Pickles. Olives. Fruits Canned fruit in a light or heavy syrup. Fried fruit. Fruit in cream or butter sauce. Meat and other protein foods Fatty cuts of meat. Ribs. Fried meat. Bacon. Sausage. Bologna and other processed lunch meats. Salami. Fatback. Hotdogs. Bratwurst. Salted nuts and seeds. Canned beans with added salt. Canned or smoked fish. Whole eggs or egg yolks. Chicken or turkey with skin. Dairy Whole or 2% milk, cream, and half-and-half. Whole or full-fat cream cheese. Whole-fat or sweetened yogurt. Full-fat cheese. Nondairy creamers. Whipped toppings.  Processed cheese and cheese spreads. Fats and oils Butter. Stick margarine. Lard. Shortening. Ghee. Bacon fat. Tropical oils, such as coconut, palm kernel, or palm oil. Seasoning and other foods Salted popcorn and pretzels. Onion salt, garlic salt, seasoned salt, table salt, and sea salt. Worcestershire sauce. Tartar sauce. Barbecue sauce. Teriyaki sauce. Soy sauce, including reduced-sodium. Steak sauce. Canned and packaged gravies. Fish sauce. Oyster sauce. Cocktail sauce. Horseradish that you find on the shelf. Ketchup. Mustard. Meat flavorings and tenderizers. Bouillon cubes. Hot sauce and Tabasco sauce. Premade or packaged marinades. Premade or packaged taco seasonings. Relishes. Regular salad dressings. Where to find more information:  National Heart, Lung, and Blood Institute: www.nhlbi.nih.gov  American Heart Association: www.heart.org Summary  The DASH eating plan is a healthy eating plan that has been shown to reduce high blood pressure (hypertension). It may also reduce your risk for type 2 diabetes, heart disease, and stroke.  With the DASH eating plan, you should limit salt (sodium) intake to 2,300 mg a day. If you have hypertension, you may need to reduce your sodium intake to 1,500 mg a day.  When on the DASH eating plan, aim to eat more fresh fruits and vegetables, whole grains, lean proteins, low-fat dairy, and heart-healthy fats.  Work with your health care provider or diet and nutrition specialist (dietitian) to adjust your eating plan to your individual   calorie needs. This information is not intended to replace advice given to you by your health care provider. Make sure you discuss any questions you have with your health care provider. Document Released: 01/25/2011 Document Revised: 01/30/2016 Document Reviewed: 01/30/2016 Elsevier Interactive Patient Education  2018 [employer].   Stop potassium Stop lozol-indapamide  Use Dyazide 37.5-25 one each a.m. Follow up in 6  weeks Do lab test of potassium in 3 weeks

## 2017-12-26 ENCOUNTER — Telehealth: Payer: Self-pay | Admitting: Family Medicine

## 2017-12-26 MED ORDER — FLUCONAZOLE 150 MG PO TABS: ORAL_TABLET | ORAL | 0 refills | Status: DC

## 2017-12-26 NOTE — Telephone Encounter (Signed)
Patient states she has an itchy discharge and has had a yeast infx before. I sent in the Diflucan,but made her aware I could not send in that medication as it is controlled. Please advise.

## 2017-12-26 NOTE — Telephone Encounter (Signed)
Pt would like to have fluconazole (DIFLUCAN) 150 MG tablet and HYDROcodone-homatropine (HYCODAN) 5-1.5 MG/5ML syrup called in please to [place], [place].

## 2017-12-27 ENCOUNTER — Other Ambulatory Visit: Payer: Self-pay | Admitting: Family Medicine

## 2017-12-27 MED ORDER — HYDROCODONE-HOMATROPINE 5-1.5 MG/5ML PO SYRP: ORAL_SOLUTION | ORAL | 0 refills | Status: DC

## 2017-12-27 NOTE — Telephone Encounter (Signed)
A prescription for the cough medicine was sent in as requested Federal and state rules make it very difficult to send in a large quantity Also please let the patient know that even though it states on the prescription may use 3 times a day we only recommend this medication at nighttime because it can cause drowsiness

## 2017-12-27 NOTE — Telephone Encounter (Signed)
Patient is aware 

## 2017-12-27 NOTE — Telephone Encounter (Signed)
I called and left a message to r/c. 

## 2018-01-01 ENCOUNTER — Telehealth: Payer: Self-pay | Admitting: Family Medicine

## 2018-01-01 ENCOUNTER — Other Ambulatory Visit: Payer: Self-pay | Admitting: Family Medicine

## 2018-01-01 MED ORDER — FLUCONAZOLE 150 MG PO TABS
ORAL_TABLET | ORAL | 1 refills | Status: DC
Start: 2018-01-01 — End: 2018-10-21

## 2018-01-01 NOTE — Telephone Encounter (Signed)
Prescription sent electronically to pharmacy  Left message to return call 

## 2018-01-01 NOTE — Telephone Encounter (Signed)
Patient notified

## 2018-01-01 NOTE — Telephone Encounter (Signed)
Diflucan 150 mg, 1 p.o. x1, 1 refill May use OTC intravaginal cream for yeast as well

## 2018-01-01 NOTE — Telephone Encounter (Signed)
Patient states has very bad yeast infection and needing another round of diflucan and a cream.She states has three more days of antibiotic to finish up called into [place]

## 2018-02-04 ENCOUNTER — Encounter: Payer: Self-pay | Admitting: Family Medicine

## 2018-02-04 ENCOUNTER — Ambulatory Visit: Payer: 59 | Admitting: Family Medicine

## 2018-02-04 VITALS — BP 126/88 | Ht 69.5 in | Wt 297.6 lb

## 2018-02-04 DIAGNOSIS — M1711 Unilateral primary osteoarthritis, right knee: Secondary | ICD-10-CM | POA: Diagnosis not present

## 2018-02-04 DIAGNOSIS — G4700 Insomnia, unspecified: Secondary | ICD-10-CM | POA: Insufficient documentation

## 2018-02-04 DIAGNOSIS — I10 Essential (primary) hypertension: Secondary | ICD-10-CM

## 2018-02-04 MED ORDER — ALPRAZOLAM 1 MG PO TABS: ORAL_TABLET | ORAL | 5 refills | Status: DC

## 2018-02-04 MED ORDER — MELOXICAM 15 MG PO TABS: 15.0000 mg | ORAL_TABLET | Freq: Every day | ORAL | 2 refills | Status: DC

## 2018-02-04 NOTE — Progress Notes (Signed)
   Subjective:    Patient ID: Carol Sharp, female    DOB: 08/04/1975, 42 y.o.   MRN: [id number]  Hypertension  This is a new problem. Pertinent negatives include no chest pain or shortness of breath. (Pt states she keeps a headache) There are no compliance problems.   Patient doing a good job taking the medication Is trying to watch salt in her diet Try to stay physically active to some degree but works a lot Denies any chest pain shortness of breath     Review of Systems  Constitutional: Negative for activity change, appetite change and fatigue.  HENT: Negative for congestion and rhinorrhea.   Respiratory: Negative for cough and shortness of breath.   Cardiovascular: Negative for chest pain and leg swelling.  Gastrointestinal: Negative for abdominal pain and diarrhea.  Endocrine: Negative for polydipsia and polyphagia.  Skin: Negative for color change.  Neurological: Negative for dizziness and weakness.  Psychiatric/Behavioral: Negative for behavioral problems and confusion.       Objective:   Physical Exam Vitals signs reviewed.  Constitutional:      General: She is not in acute distress. HENT:     Head: Normocephalic and atraumatic.  Eyes:     General:        Right eye: No discharge.        Left eye: No discharge.  Neck:     Trachea: No tracheal deviation.  Cardiovascular:     Rate and Rhythm: Normal rate and regular rhythm.     Heart sounds: Normal heart sounds. No murmur.  Pulmonary:     Effort: Pulmonary effort is normal. No respiratory distress.     Breath sounds: Normal breath sounds.  Lymphadenopathy:     Cervical: No cervical adenopathy.  Skin:    General: Skin is warm and dry.  Neurological:     Mental Status: She is alert.     Coordination: Coordination normal.  Psychiatric:        Behavior: Behavior normal.           Assessment & Plan:  Blood pressure rechecked several times Under good control Continue watch diet patient unfortunately  also has morbid obesity The patient is going to try to do the best she can try to watch her diet try to lose weight. Patient will follow-up within 6 months.   May use meloxicam as needed for her knees and ankles.   Patient uses Xanax sparingly to help her with rest at night  Follow-up within 6 months

## 2018-02-04 NOTE — Patient Instructions (Signed)
DASH Eating Plan DASH stands for "Dietary Approaches to Stop Hypertension." The DASH eating plan is a healthy eating plan that has been shown to reduce high blood pressure (hypertension). It may also reduce your risk for type 2 diabetes, heart disease, and stroke. The DASH eating plan may also help with weight loss. What are tips for following this plan? General guidelines  Avoid eating more than 2,300 mg (milligrams) of salt (sodium) a day. If you have hypertension, you may need to reduce your sodium intake to 1,500 mg a day.  Limit alcohol intake to no more than 1 drink a day for nonpregnant women and 2 drinks a day for men. One drink equals 12 oz of beer, 5 oz of wine, or 1 oz of hard liquor.  Work with your health care provider to maintain a healthy body weight or to lose weight. Ask what an ideal weight is for you.  Get at least 30 minutes of exercise that causes your heart to beat faster (aerobic exercise) most days of the week. Activities may include walking, swimming, or biking.  Work with your health care provider or diet and nutrition specialist (dietitian) to adjust your eating plan to your individual calorie needs. Reading food labels  Check food labels for the amount of sodium per serving. Choose foods with less than 5 percent of the Daily Value of sodium. Generally, foods with less than 300 mg of sodium per serving fit into this eating plan.  To find whole grains, look for the word "whole" as the first word in the ingredient list. Shopping  Buy products labeled as "low-sodium" or "no salt added."  Buy fresh foods. Avoid canned foods and premade or frozen meals. Cooking  Avoid adding salt when cooking. Use salt-free seasonings or herbs instead of table salt or sea salt. Check with your health care provider or pharmacist before using salt substitutes.  Do not fry foods. Cook foods using healthy methods such as baking, boiling, grilling, and broiling instead.  Cook with  heart-healthy oils, such as olive, canola, soybean, or sunflower oil. Meal planning   Eat a balanced diet that includes: ? 5 or more servings of fruits and vegetables each day. At each meal, try to fill half of your plate with fruits and vegetables. ? Up to 6-8 servings of whole grains each day. ? Less than 6 oz of lean meat, poultry, or fish each day. A 3-oz serving of meat is about the same size as a deck of cards. One egg equals 1 oz. ? 2 servings of low-fat dairy each day. ? A serving of nuts, seeds, or beans 5 times each week. ? Heart-healthy fats. Healthy fats called Omega-3 fatty acids are found in foods such as flaxseeds and coldwater fish, like sardines, salmon, and mackerel.  Limit how much you eat of the following: ? Canned or prepackaged foods. ? Food that is high in trans fat, such as fried foods. ? Food that is high in saturated fat, such as fatty meat. ? Sweets, desserts, sugary drinks, and other foods with added sugar. ? Full-fat dairy products.  Do not salt foods before eating.  Try to eat at least 2 vegetarian meals each week.  Eat more home-cooked food and less restaurant, buffet, and fast food.  When eating at a restaurant, ask that your food be prepared with less salt or no salt, if possible. What foods are recommended? The items listed may not be a complete list. Talk with your dietitian about what   dietary choices are best for you. Grains Whole-grain or whole-wheat bread. Whole-grain or whole-wheat pasta. Brown rice. Oatmeal. Quinoa. Bulgur. Whole-grain and low-sodium cereals. Pita bread. Low-fat, low-sodium crackers. Whole-wheat flour tortillas. Vegetables Fresh or frozen vegetables (raw, steamed, roasted, or grilled). Low-sodium or reduced-sodium tomato and vegetable juice. Low-sodium or reduced-sodium tomato sauce and tomato paste. Low-sodium or reduced-sodium canned vegetables. Fruits All fresh, dried, or frozen fruit. Canned fruit in natural juice (without  added sugar). Meat and other protein foods Skinless chicken or turkey. Ground chicken or turkey. Pork with fat trimmed off. Fish and seafood. Egg whites. Dried beans, peas, or lentils. Unsalted nuts, nut butters, and seeds. Unsalted canned beans. Lean cuts of beef with fat trimmed off. Low-sodium, lean deli meat. Dairy Low-fat (1%) or fat-free (skim) milk. Fat-free, low-fat, or reduced-fat cheeses. Nonfat, low-sodium ricotta or cottage cheese. Low-fat or nonfat yogurt. Low-fat, low-sodium cheese. Fats and oils Soft margarine without trans fats. Vegetable oil. Low-fat, reduced-fat, or light mayonnaise and salad dressings (reduced-sodium). Canola, safflower, olive, soybean, and sunflower oils. Avocado. Seasoning and other foods Herbs. Spices. Seasoning mixes without salt. Unsalted popcorn and pretzels. Fat-free sweets. What foods are not recommended? The items listed may not be a complete list. Talk with your dietitian about what dietary choices are best for you. Grains Baked goods made with fat, such as croissants, muffins, or some breads. Dry pasta or rice meal packs. Vegetables Creamed or fried vegetables. Vegetables in a cheese sauce. Regular canned vegetables (not low-sodium or reduced-sodium). Regular canned tomato sauce and paste (not low-sodium or reduced-sodium). Regular tomato and vegetable juice (not low-sodium or reduced-sodium). Pickles. Olives. Fruits Canned fruit in a light or heavy syrup. Fried fruit. Fruit in cream or butter sauce. Meat and other protein foods Fatty cuts of meat. Ribs. Fried meat. Bacon. Sausage. Bologna and other processed lunch meats. Salami. Fatback. Hotdogs. Bratwurst. Salted nuts and seeds. Canned beans with added salt. Canned or smoked fish. Whole eggs or egg yolks. Chicken or turkey with skin. Dairy Whole or 2% milk, cream, and half-and-half. Whole or full-fat cream cheese. Whole-fat or sweetened yogurt. Full-fat cheese. Nondairy creamers. Whipped toppings.  Processed cheese and cheese spreads. Fats and oils Butter. Stick margarine. Lard. Shortening. Ghee. Bacon fat. Tropical oils, such as coconut, palm kernel, or palm oil. Seasoning and other foods Salted popcorn and pretzels. Onion salt, garlic salt, seasoned salt, table salt, and sea salt. Worcestershire sauce. Tartar sauce. Barbecue sauce. Teriyaki sauce. Soy sauce, including reduced-sodium. Steak sauce. Canned and packaged gravies. Fish sauce. Oyster sauce. Cocktail sauce. Horseradish that you find on the shelf. Ketchup. Mustard. Meat flavorings and tenderizers. Bouillon cubes. Hot sauce and Tabasco sauce. Premade or packaged marinades. Premade or packaged taco seasonings. Relishes. Regular salad dressings. Where to find more information:  National Heart, Lung, and Blood Institute: www.nhlbi.nih.gov  American Heart Association: www.heart.org Summary  The DASH eating plan is a healthy eating plan that has been shown to reduce high blood pressure (hypertension). It may also reduce your risk for type 2 diabetes, heart disease, and stroke.  With the DASH eating plan, you should limit salt (sodium) intake to 2,300 mg a day. If you have hypertension, you may need to reduce your sodium intake to 1,500 mg a day.  When on the DASH eating plan, aim to eat more fresh fruits and vegetables, whole grains, lean proteins, low-fat dairy, and heart-healthy fats.  Work with your health care provider or diet and nutrition specialist (dietitian) to adjust your eating plan to your individual   calorie needs. This information is not intended to replace advice given to you by your health care provider. Make sure you discuss any questions you have with your health care provider. Document Released: 01/25/2011 Document Revised: 01/30/2016 Document Reviewed: 01/30/2016 Elsevier Interactive Patient Education  2018 [employer]  

## 2018-02-10 ENCOUNTER — Telehealth: Payer: Self-pay | Admitting: Family Medicine

## 2018-02-10 NOTE — Telephone Encounter (Signed)
This does sound viral She may have a work excuse as requested If she needs any medication for nausea let us know Follow-up if ongoing troubles

## 2018-02-10 NOTE — Telephone Encounter (Signed)
Vomiting Saturday, diarrhea started last night, body aches, no fever, pt missed work today and would like a work note today and tomorrow, pt states  She will come in for an appt if she needs to but pt feels like she caught something viral. Advise.

## 2018-02-10 NOTE — Telephone Encounter (Signed)
Discussed with pt. Pt verbalized understanding. She has phenergan for nausea. She just needs work excuse. Please give work excuse for today and tomorrow.

## 2018-02-11 ENCOUNTER — Encounter: Payer: Self-pay | Admitting: Family Medicine

## 2018-02-11 NOTE — Telephone Encounter (Signed)
Note picked up

## 2018-05-06 ENCOUNTER — Other Ambulatory Visit: Payer: Self-pay

## 2018-05-06 ENCOUNTER — Emergency Department (HOSPITAL_COMMUNITY): Payer: 59

## 2018-05-06 ENCOUNTER — Emergency Department (HOSPITAL_COMMUNITY)
Admission: EM | Admit: 2018-05-06 | Discharge: 2018-05-06 | Disposition: A | Discharge status: Home / Self Care | Payer: 59 | Attending: Emergency Medicine | Admitting: Emergency Medicine

## 2018-05-06 ENCOUNTER — Encounter (HOSPITAL_COMMUNITY): Payer: Self-pay | Admitting: Emergency Medicine

## 2018-05-06 DIAGNOSIS — M545 Low back pain, unspecified: Secondary | ICD-10-CM

## 2018-05-06 DIAGNOSIS — I10 Essential (primary) hypertension: Secondary | ICD-10-CM | POA: Insufficient documentation

## 2018-05-06 DIAGNOSIS — R1030 Lower abdominal pain, unspecified: Secondary | ICD-10-CM | POA: Insufficient documentation

## 2018-05-06 DIAGNOSIS — Z79899 Other long term (current) drug therapy: Secondary | ICD-10-CM | POA: Diagnosis not present

## 2018-05-06 DIAGNOSIS — N200 Calculus of kidney: Secondary | ICD-10-CM | POA: Diagnosis not present

## 2018-05-06 DIAGNOSIS — F17210 Nicotine dependence, cigarettes, uncomplicated: Secondary | ICD-10-CM | POA: Diagnosis not present

## 2018-05-06 LAB — URINALYSIS, ROUTINE W REFLEX MICROSCOPIC
Bilirubin Urine: NEGATIVE
Glucose, UA: NEGATIVE mg/dL
Hgb urine dipstick: NEGATIVE
Ketones, ur: NEGATIVE mg/dL
Leukocytes,Ua: NEGATIVE
Nitrite: NEGATIVE
Protein, ur: NEGATIVE mg/dL
Specific Gravity, Urine: 1.013 (ref 1.005–1.030)
pH: 6 (ref 5.0–8.0)

## 2018-05-06 LAB — COMPREHENSIVE METABOLIC PANEL
ALT: 49 U/L — ABNORMAL HIGH (ref 0–44)
AST: 23 U/L (ref 15–41)
Albumin: 3.9 g/dL (ref 3.5–5.0)
Alkaline Phosphatase: 59 U/L (ref 38–126)
Anion gap: 9 (ref 5–15)
BUN: 16 mg/dL (ref 6–20)
CO2: 24 mmol/L (ref 22–32)
Calcium: 8.8 mg/dL — ABNORMAL LOW (ref 8.9–10.3)
Chloride: 106 mmol/L (ref 98–111)
Creatinine, Ser: 0.72 mg/dL (ref 0.44–1.00)
GFR calc Af Amer: >60 mL/min (ref >60)
GFR calc non Af Amer: >60 mL/min (ref >60)
Glucose, Bld: 103 mg/dL — ABNORMAL HIGH (ref 70–99)
Potassium: 3.2 mmol/L — ABNORMAL LOW (ref 3.5–5.1)
Sodium: 139 mmol/L (ref 135–145)
Total Bilirubin: 0.7 mg/dL (ref 0.3–1.2)
Total Protein: 6.7 g/dL (ref 6.5–8.1)

## 2018-05-06 LAB — CBC WITH DIFFERENTIAL/PLATELET
Abs Immature Granulocytes: 0.01 10*3/uL (ref 0.00–0.07)
Basophils Absolute: 0.1 10*3/uL (ref 0.0–0.1)
Basophils Relative: 1 %
Eosinophils Absolute: 0.8 10*3/uL — ABNORMAL HIGH (ref 0.0–0.5)
Eosinophils Relative: 11 %
HCT: 43.3 % (ref 36.0–46.0)
Hemoglobin: 15.1 g/dL — ABNORMAL HIGH (ref 12.0–15.0)
Immature Granulocytes: 0 %
Lymphocytes Relative: 33 %
Lymphs Abs: 2.5 10*3/uL (ref 0.7–4.0)
MCH: 31.7 pg (ref 26.0–34.0)
MCHC: 34.9 g/dL (ref 30.0–36.0)
MCV: 91 fL (ref 80.0–100.0)
Monocytes Absolute: 0.4 10*3/uL (ref 0.1–1.0)
Monocytes Relative: 5 %
Neutro Abs: 3.8 10*3/uL (ref 1.7–7.7)
Neutrophils Relative %: 50 %
Platelets: 294 10*3/uL (ref 150–400)
RBC: 4.76 MIL/uL (ref 3.87–5.11)
RDW: 12.7 % (ref 11.5–15.5)
WBC: 7.5 10*3/uL (ref 4.0–10.5)
nRBC: 0 % (ref 0.0–0.2)

## 2018-05-06 MED ORDER — LORAZEPAM 2 MG/ML IJ SOLN
1.0000 mg | Freq: Once | INTRAMUSCULAR | Status: AC
  Administered 2018-05-06: 1 mg via INTRAVENOUS
  Filled 2018-05-06: qty 1

## 2018-05-06 MED ORDER — ONDANSETRON HCL 4 MG/2ML IJ SOLN
4.0000 mg | Freq: Once | INTRAMUSCULAR | Status: AC
  Administered 2018-05-06: 4 mg via INTRAVENOUS
  Filled 2018-05-06: qty 2

## 2018-05-06 MED ORDER — DIAZEPAM 5 MG PO TABS: 5.0000 mg | ORAL_TABLET | Freq: Two times a day (BID) | ORAL | 0 refills | Status: DC

## 2018-05-06 MED ORDER — HYDROCODONE-ACETAMINOPHEN 5-325 MG PO TABS: 1.0000 | ORAL_TABLET | ORAL | 0 refills | Status: DC | PRN

## 2018-05-06 MED ORDER — IOHEXOL 300 MG/ML  SOLN
100.0000 mL | Freq: Once | INTRAMUSCULAR | Status: AC | PRN
  Administered 2018-05-06: 100 mL via INTRAVENOUS

## 2018-05-06 MED ORDER — IBUPROFEN 600 MG PO TABS: 600.0000 mg | ORAL_TABLET | Freq: Four times a day (QID) | ORAL | 0 refills | Status: DC | PRN

## 2018-05-06 MED ORDER — FENTANYL CITRATE (PF) 100 MCG/2ML IJ SOLN
50.0000 ug | Freq: Once | INTRAMUSCULAR | Status: AC
  Administered 2018-05-06: 50 ug via INTRAVENOUS
  Filled 2018-05-06: qty 2

## 2018-05-06 MED ORDER — MORPHINE SULFATE (PF) 4 MG/ML IV SOLN
4.0000 mg | Freq: Once | INTRAVENOUS | Status: AC
  Administered 2018-05-06: 4 mg via INTRAVENOUS
  Filled 2018-05-06: qty 1

## 2018-05-06 MED ORDER — SODIUM CHLORIDE 0.9 % IV BOLUS
500.0000 mL | Freq: Once | INTRAVENOUS | Status: AC
  Administered 2018-05-06: 500 mL via INTRAVENOUS

## 2018-05-06 NOTE — ED Provider Notes (Signed)
[facility] EMERGENCY DEPARTMENT Provider Note   CSN: [id number] Arrival date & time: 05/06/18  0604  Time seen 6:20 AM  History   Chief Complaint Chief Complaint  Patient presents with  . Back Pain    HPI Carol Sharp is a 43 y.o. female.     HPI patient states week ago she started getting pain in her lower back and now it radiates around both sides of her lower abdomen.  She states the pain in her back and in her abdomen is worse on the left than the right side.  She has noted that sneezing and the process of standing up makes her back pain hurt worse.  She states heat and ice will help but only for 30 seconds.  She states the pain is been constant and describes it as sharp and dull.  She denies any injury or change in activity prior to this pain starting.  She denies nausea, vomiting, diarrhea, dysuria, frequency, or vaginal discharge.  She denies any numbness in her extremities.  She denies any weakness in her legs.  She states sometimes she gets extremely sharp pain and it makes her almost fall to her knees.  She states she is never had this pain before.  PCP Kathyrn Drown, MD   Past Medical History:  Diagnosis Date  . Anxiety   . Arthritis   . Breast nodule 08/02/2015  . Depression   . Hypertension   . MRSA cellulitis May 2005    Patient Active Problem List   Diagnosis Date Noted  . Insomnia 02/04/2018  . S/P arthroscopy of right knee 04/18/17   . Primary osteoarthritis of right knee   . Anxiety 12/09/2015  . Breast nodule 08/02/2015  . Morbid obesity ([place]) 07/15/2013  . Benign essential hypertension 10/06/2012  . Adjustment disorder with mixed anxiety and depressed mood 09/08/2012  . SPRAIN&STRAIN OTHER SPECIFIED SITES KNEE&LEG 07/21/2008    Past Surgical History:  Procedure Laterality Date  . ABLATION  May 2010  . DILATION AND CURETTAGE OF UTERUS  Oct 2000  . KNEE ARTHROSCOPY WITH MEDIAL MENISECTOMY Right 04/18/2017   Procedure: RIGHT KNEE ARTHROSCOPY  WITH PARTIAL  MEDIAL MENISECTOMY;  Surgeon: Carole Civil, MD;  Location: [facility];  Service: Orthopedics;  Laterality: Right;  . TONSILLECTOMY  1981?  . TUBAL LIGATION       OB History    Gravida  3   Para  2   Term      Preterm      AB  1   Living  2     SAB  1   TAB      Ectopic      Multiple      Live Births               Home Medications    Prior to Admission medications   Medication Sig Start Date End Date Taking? Authorizing Provider  ALPRAZolam Duanne Moron) 1 MG tablet TAKE 1/2 TO 1 TABLET BY MOUTH AT BEDTIME AS NEEDED FOR INSOMNIA. 02/04/18   Kathyrn Drown, MD  fluconazole (DIFLUCAN) 150 MG tablet One tablet po times one 01/01/18   Kathyrn Drown, MD  meloxicam (MOBIC) 15 MG tablet Take 1 tablet (15 mg total) by mouth daily. 02/04/18   Kathyrn Drown, MD  triamterene-hydrochlorothiazide (DYAZIDE) 37.5-25 MG capsule Take 1 each (1 capsule total) by mouth daily. 12/25/17   Kathyrn Drown, MD    Family History Family History  Problem Relation Age of Onset  . Hypertension Mother   . Diabetes Mother   . Hyperlipidemia Mother   . Thyroid disease Mother   . Seizures Mother   . Hyperlipidemia Father   . Cancer Paternal Aunt        breast  . Cancer Maternal Grandmother        colon  . Cancer Paternal Grandmother        breast  . Cancer Paternal Aunt        breast  . Cancer Paternal Aunt        breast    Social History Social History   Tobacco Use  . Smoking status: Current Some Day Smoker    Packs/day: 0.50    Years: 15.00    Pack years: 7.50    Types: Cigarettes  . Smokeless tobacco: Never Used  Substance Use Topics  . Alcohol use: Yes    Comment: occ  . Drug use: No  employed (desk job) Lives with spouse   Allergies   Amlodipine   Review of Systems Review of Systems  All other systems reviewed and are negative.    Physical Exam Updated Vital Signs BP 138/63 (BP Location: Right Arm)   Pulse 70   Temp 97.8 F (36.6  C) (Oral)   SpO2 98%   Vital signs normal    Physical Exam Vitals signs and nursing note reviewed.  Constitutional:      General: She is in acute distress.     Appearance: Normal appearance.  HENT:     Head: Normocephalic and atraumatic.     Right Ear: External ear normal.     Left Ear: External ear normal.     Nose: Nose normal.     Mouth/Throat:     Mouth: Mucous membranes are moist.     Pharynx: No oropharyngeal exudate or posterior oropharyngeal erythema.  Eyes:     Extraocular Movements: Extraocular movements intact.     Conjunctiva/sclera: Conjunctivae normal.     Pupils: Pupils are equal, round, and reactive to light.  Neck:     Musculoskeletal: Normal range of motion.  Cardiovascular:     Rate and Rhythm: Normal rate.     Pulses: Normal pulses.  Pulmonary:     Effort: Pulmonary effort is normal. No respiratory distress.     Breath sounds: Normal breath sounds.  Abdominal:     General: Abdomen is flat. There is no distension.     Palpations: Abdomen is soft.     Tenderness: There is abdominal tenderness. There is no guarding or rebound.     Hernia: No hernia is present.       Comments: Tender diffusely in her lower abdomen but most tender in her left lower quadrant  Musculoskeletal:       Back:     Comments: Patient is tender diffusely in her lumbar spine.  She is not tender over the SI joints or in the flank area to percussion.  On lumbar range of motion she has no pain to left or right range of motion but she states it hurts to do forward flexion.  She will not try to lay flat back but rolls over onto her side although she states she does not know if it would hurt if she would lay flat back.  She has positive mild straight leg raising on the left, not on the right.  Her patellar reflexes are 2+ and equal.  Neurological:     Mental Status:  She is alert.      ED Treatments / Results  Labs (all labs ordered are listed, but only abnormal results are displayed)   Results for orders placed or performed during the hospital encounter of 05/06/18  CBC with Differential  Result Value Ref Range   WBC 7.5 4.0 - 10.5 K/uL   RBC 4.76 3.87 - 5.11 MIL/uL   Hemoglobin 15.1 (H) 12.0 - 15.0 g/dL   HCT 43.3 36.0 - 46.0 %   MCV 91.0 80.0 - 100.0 fL   MCH 31.7 26.0 - 34.0 pg   MCHC 34.9 30.0 - 36.0 g/dL   RDW 12.7 11.5 - 15.5 %   Platelets 294 150 - 400 K/uL   nRBC 0.0 0.0 - 0.2 %   Neutrophils Relative % 50 %   Neutro Abs 3.8 1.7 - 7.7 K/uL   Lymphocytes Relative 33 %   Lymphs Abs 2.5 0.7 - 4.0 K/uL   Monocytes Relative 5 %   Monocytes Absolute 0.4 0.1 - 1.0 K/uL   Eosinophils Relative 11 %   Eosinophils Absolute 0.8 (H) 0.0 - 0.5 K/uL   Basophils Relative 1 %   Basophils Absolute 0.1 0.0 - 0.1 K/uL   Immature Granulocytes 0 %   Abs Immature Granulocytes 0.01 0.00 - 0.07 K/uL   Laboratory interpretation all normal, labs still pending   EKG None  Radiology No results found.  Procedures Procedures (including critical care time)  Medications Ordered in ED Medications  sodium chloride 0.9 % bolus 500 mL (500 mLs Intravenous New Bag/Given 05/06/18 0654)  fentaNYL (SUBLIMAZE) injection 50 mcg (50 mcg Intravenous Given 05/06/18 0655)  ondansetron (ZOFRAN) injection 4 mg (4 mg Intravenous Given 05/06/18 0654)     Initial Impression / Assessment and Plan / ED Course  I have reviewed the triage vital signs and the nursing notes.  Pertinent labs & imaging results that were available during my care of the patient were reviewed by me and considered in my medical decision making (see chart for details).       Patient was given IV fluids and IV pain and nausea medication.  My impression is after examining her back and listening to her describe her symptoms is that she is having musculoskeletal back pain however I am not sure how to explain her lower abdominal pain.  She has no other GI symptoms or GU symptoms.  Therefore CT scan was done to make sure  there is nothing intra-abdominal that is causing her discomfort such as diverticulitis or renal stone.  7:15 AM patient left a change of shift with Dr. Gilford Raid who will check her test results.  Final Clinical Impressions(s) / ED Diagnoses   Final diagnoses:  Acute midline low back pain without sciatica  Lower abdominal pain    Disposition pending  Rolland Porter, MD, Barbette Or, MD 05/06/18 [phone]

## 2018-05-06 NOTE — ED Provider Notes (Signed)
Pt signed out by Dr. Tomi Bamberger pending labs and CT results.  Labs unremarkable other than mildly low K.  CT renal shows no ureteral stones.  Pt will be d/c home with valium, lortab, and ibuprofen.  She is instructed to return if worse.  F/u with pcp.   Isla Pence, MD 05/06/18 [phone]

## 2018-05-06 NOTE — ED Triage Notes (Signed)
Pt C/O bilateral lower back pain that began 1 week ago. Pt states last night around 2200 that pain had gotten worse. Pt states the pain wraps around to both flanks. Denies any urinary symptoms.

## 2018-05-07 ENCOUNTER — Telehealth: Payer: Self-pay | Admitting: *Deleted

## 2018-05-07 ENCOUNTER — Other Ambulatory Visit: Payer: Self-pay | Admitting: Family Medicine

## 2018-05-07 MED ORDER — PREDNISONE 20 MG PO TABS: ORAL_TABLET | ORAL | 0 refills | Status: DC

## 2018-05-07 MED ORDER — HYDROCODONE-ACETAMINOPHEN 5-325 MG PO TABS: 1.0000 | ORAL_TABLET | Freq: Four times a day (QID) | ORAL | 0 refills | Status: DC | PRN

## 2018-05-07 NOTE — Telephone Encounter (Signed)
So I can send in some hydrocodone but cannot do muscle relaxers with hydrocodone that is too risky in regards to potential for side effects and dangerous combination according to government and medical institutions etc.  So I will send in the hydrocodone I recommend massage warm compresses for any muscle spasms

## 2018-05-07 NOTE — Telephone Encounter (Signed)
Patient advised Dr Nicki Reaper can send in some hydrocodone but cannot do muscle relaxers with hydrocodone that is too risky in regards to potential for side effects and dangerous combination according to government and medical institutions etc.  Dr Nicki Reaper will send in the hydrocodone And he recommends massage warm compresses for any muscle spasms Patient verbalized understanding.

## 2018-05-07 NOTE — Telephone Encounter (Signed)
Pt notified. Pt wants to know if she can have a muscle relaxer and more hydrocodone. She has three pills left.

## 2018-05-07 NOTE — Telephone Encounter (Signed)
Went to [facility] yesterday for back pain. Pt states she was told she pulled a muscle. They did scans that were negative per pt. Prescribed diazepam, hydrocodone, and ibuprofen 600mg .  Diazepam is giving her a headache. hydrcododne does help but she is taking one every 4 hours and they only gave her 8 tablets. Taking ibuprofen twice a day. Pt wants to know if she could get prednisone to help with pain.   [place] [facility].

## 2018-05-07 NOTE — Telephone Encounter (Signed)
3qd for 3d then 2qd for 3d then 1qd for 3d Prednisone 20mg ,#18 Should gradually get better if issues call

## 2018-05-07 NOTE — Telephone Encounter (Signed)
Prescription sent electronically to pharmacy. Left message to return call to notify patient. 

## 2018-05-09 NOTE — Telephone Encounter (Signed)
Error

## 2018-05-09 NOTE — Telephone Encounter (Signed)
error 

## 2018-06-06 ENCOUNTER — Other Ambulatory Visit: Payer: Self-pay | Admitting: Family Medicine

## 2018-07-08 ENCOUNTER — Other Ambulatory Visit: Payer: Self-pay | Admitting: Family Medicine

## 2018-08-20 ENCOUNTER — Other Ambulatory Visit: Payer: Self-pay | Admitting: Family Medicine

## 2018-08-20 NOTE — Telephone Encounter (Signed)
Needs to schedule virtual visit 1 refill each

## 2018-08-21 NOTE — Telephone Encounter (Signed)
Please schedule and then let nurses know so we can send in refills. thanks

## 2018-08-21 NOTE — Telephone Encounter (Signed)
Appointment scheduled on 7/9 for med check

## 2018-08-28 ENCOUNTER — Ambulatory Visit (INDEPENDENT_AMBULATORY_CARE_PROVIDER_SITE_OTHER): Payer: 59 | Admitting: Family Medicine

## 2018-08-28 ENCOUNTER — Other Ambulatory Visit: Payer: Self-pay

## 2018-08-28 DIAGNOSIS — G4700 Insomnia, unspecified: Secondary | ICD-10-CM

## 2018-08-28 DIAGNOSIS — I10 Essential (primary) hypertension: Secondary | ICD-10-CM

## 2018-08-28 MED ORDER — VALACYCLOVIR HCL 1 G PO TABS: ORAL_TABLET | ORAL | 0 refills | Status: DC

## 2018-08-28 MED ORDER — ALPRAZOLAM 1 MG PO TABS: ORAL_TABLET | ORAL | 5 refills | Status: DC

## 2018-08-28 MED ORDER — VALACYCLOVIR HCL 1 G PO TABS: ORAL_TABLET | ORAL | 5 refills | Status: DC

## 2018-08-28 MED ORDER — TRIAMTERENE-HCTZ 37.5-25 MG PO TABS: 1.0000 | ORAL_TABLET | Freq: Every day | ORAL | 5 refills | Status: DC

## 2018-08-28 NOTE — Progress Notes (Signed)
   Subjective:    Patient ID: MANHA AMATO, female    DOB: 07/02/1975, 43 y.o.   MRN: [id number]  Hypertension This is a chronic problem. Pertinent negatives include no chest pain or shortness of breath.   Pt states that she checks her BP if she goes into the office at work.  She states her blood pressure overall doing well she is watching diet as best she can she is trying exercise try and lose some weight Pt is having bad blisters on her lip. Pt states that they fever like blister or sun blisters. Started this week.  She typically gets these maybe once a month but recently more often she describes them as blisters that do not respond to topical medicines Virtual Visit via Video Note  I connected with BEAUTIFULL CISAR on 08/28/18 at 10:00 AM EDT by a video enabled telemedicine application and verified that I am speaking with the correct person using two identifiers.  Location: Patient: home Provider:office   I discussed the limitations of evaluation and management by telemedicine and the availability of in person appointments. The patient expressed understanding and agreed to proceed.  History of Present Illness:    Observations/Objective:   Assessment and Plan:   Follow Up Instructions:    I discussed the assessment and treatment plan with the patient. The patient was provided an opportunity to ask questions and all were answered. The patient agreed with the plan and demonstrated an understanding of the instructions.   The patient was advised to call back or seek an in-person evaluation if the symptoms worsen or if the condition fails to improve as anticipated.  I provided 15 minutes of non-face-to-face time during this encounter.   Vicente Males, LPN     Review of Systems  Constitutional: Negative for activity change, appetite change and fatigue.  HENT: Negative for congestion and rhinorrhea.   Respiratory: Negative for cough and shortness of breath.    Cardiovascular: Negative for chest pain and leg swelling.  Gastrointestinal: Negative for abdominal pain and diarrhea.  Endocrine: Negative for polydipsia and polyphagia.  Skin: Negative for color change.  Neurological: Negative for dizziness and weakness.  Psychiatric/Behavioral: Negative for behavioral problems and confusion.       Objective:   Physical Exam Patient had virtual visit Appears to be in no distress Atraumatic Neuro able to relate and oriented No apparent resp distress Color normal        Assessment & Plan:  Fever blister Valtrex 3 times daily for the next week after that I recommend Valtrex 2 tablets followed 12 hours later by 2 tablets when necessary  Insomnia patient uses Xanax at nighttime refills were given  Blood pressure good control refills given  Follow-up 6 months follow-up sooner problems keep up with female health checkups

## 2018-09-04 ENCOUNTER — Other Ambulatory Visit: Payer: 59

## 2018-09-04 ENCOUNTER — Other Ambulatory Visit: Payer: Self-pay

## 2018-09-04 DIAGNOSIS — Z20822 Contact with and (suspected) exposure to covid-19: Secondary | ICD-10-CM

## 2018-09-09 LAB — NOVEL CORONAVIRUS, NAA: SARS-CoV-2, NAA: NOT DETECTED

## 2018-09-30 ENCOUNTER — Other Ambulatory Visit: Payer: Self-pay | Admitting: Family Medicine

## 2018-10-20 ENCOUNTER — Other Ambulatory Visit: Payer: Self-pay | Admitting: Family Medicine

## 2018-10-21 ENCOUNTER — Telehealth: Payer: Self-pay | Admitting: Family Medicine

## 2018-10-21 NOTE — Telephone Encounter (Signed)
Pt returned call and verbalized understanding  

## 2018-10-21 NOTE — Telephone Encounter (Signed)
Refills for Mobic were sent to [facility] on 10/02/2018 with 4 refills

## 2018-10-21 NOTE — Telephone Encounter (Signed)
Patient was seen on 7/9 and forgot to get refill on Mobic 15 mg called into [place]

## 2018-10-21 NOTE — Telephone Encounter (Signed)
Left message to return call 

## 2018-10-29 ENCOUNTER — Telehealth: Payer: Self-pay | Admitting: Family Medicine

## 2018-10-29 NOTE — Telephone Encounter (Signed)
Protonix 40 mg 1 daily #30, 3 refills

## 2018-10-29 NOTE — Telephone Encounter (Signed)
Patient is requesting a prescription for protonix called into [place]

## 2018-10-30 ENCOUNTER — Other Ambulatory Visit: Payer: Self-pay | Admitting: *Deleted

## 2018-10-30 MED ORDER — PANTOPRAZOLE SODIUM 40 MG PO TBEC: 40.0000 mg | DELAYED_RELEASE_TABLET | Freq: Every day | ORAL | 3 refills | Status: DC

## 2018-10-30 NOTE — Telephone Encounter (Signed)
Refills sent. Pt notified.

## 2018-12-05 ENCOUNTER — Other Ambulatory Visit: Payer: Self-pay

## 2018-12-05 ENCOUNTER — Ambulatory Visit
Admission: EM | Admit: 2018-12-05 | Discharge: 2018-12-05 | Disposition: A | Discharge status: Home / Self Care | Payer: 59 | Attending: Emergency Medicine | Admitting: Emergency Medicine

## 2018-12-05 ENCOUNTER — Telehealth: Payer: Self-pay | Admitting: Family Medicine

## 2018-12-05 DIAGNOSIS — M545 Low back pain, unspecified: Secondary | ICD-10-CM

## 2018-12-05 DIAGNOSIS — M62838 Other muscle spasm: Secondary | ICD-10-CM | POA: Diagnosis not present

## 2018-12-05 MED ORDER — PREDNISONE 20 MG PO TABS: 20.0000 mg | ORAL_TABLET | Freq: Two times a day (BID) | ORAL | 0 refills | Status: AC

## 2018-12-05 MED ORDER — CYCLOBENZAPRINE HCL 10 MG PO TABS: 10.0000 mg | ORAL_TABLET | Freq: Every day | ORAL | 0 refills | Status: DC

## 2018-12-05 MED ORDER — METHYLPREDNISOLONE SODIUM SUCC 125 MG IJ SOLR
125.0000 mg | Freq: Once | INTRAMUSCULAR | Status: AC
  Administered 2018-12-05: 125 mg via INTRAMUSCULAR

## 2018-12-05 NOTE — Telephone Encounter (Signed)
Patient called in to get an appt today for back pain.  I told her we did not have any appts left for today and offered her an appt for next week or she could go to urgent care.  Patient understood and said she would think about it and call next week if she needed an appt.

## 2018-12-05 NOTE — ED Triage Notes (Signed)
Pt presents with c/o low back pain pt states that she helped move a sofa 2 days ago and is now feeling worse

## 2018-12-05 NOTE — ED Provider Notes (Signed)
[facility]   [id number] 12/05/18 Arrival Time: [id number]  CC: Back pain  SUBJECTIVE: History from: patient. Carol Sharp is a 43 y.o. female complains of acute on chronic back pain that began 2 days ago after helping move a sofa.  Localizes the pain to the low back.  Describes the pain as constant and achy in character.  Has tried OTC medications without relief.  Symptoms are made worse with ROM.  Reports similar symptoms in the past.  Denies fever, chills, erythema, ecchymosis, effusion, weakness, numbness and tingling, saddle paresthesias, loss of bowel or bladder function.      ROS: As per HPI.  All other pertinent ROS negative.     Past Medical History:  Diagnosis Date  . Anxiety   . Arthritis   . Breast nodule 08/02/2015  . Depression   . Hypertension   . MRSA cellulitis May 2005   Past Surgical History:  Procedure Laterality Date  . ABLATION  May 2010  . DILATION AND CURETTAGE OF UTERUS  Oct 2000  . KNEE ARTHROSCOPY WITH MEDIAL MENISECTOMY Right 04/18/2017   Procedure: RIGHT KNEE ARTHROSCOPY WITH PARTIAL  MEDIAL MENISECTOMY;  Surgeon: Carole Civil, MD;  Location: [facility];  Service: Orthopedics;  Laterality: Right;  . TONSILLECTOMY  1981?  . TUBAL LIGATION     Allergies  Allergen Reactions  . Amlodipine     swelling   No current facility-administered medications on file prior to encounter.    Current Outpatient Medications on File Prior to Encounter  Medication Sig Dispense Refill  . ALPRAZolam (XANAX) 1 MG tablet TAKE 1 TABLET BY MOUTH AT BEDTIME AS NEEDED FOR SLEEP. 30 tablet 5  . fluconazole (DIFLUCAN) 150 MG tablet TAKE 1 TABLET BY MOUTH NOW. 1 tablet 0  . HYDROcodone-acetaminophen (NORCO/VICODIN) 5-325 MG tablet Take 1 tablet by mouth every 6 (six) hours as needed. (Patient not taking: Reported on 08/28/2018) 20 tablet 0  . meloxicam (MOBIC) 15 MG tablet TAKE ONE TABLET BY MOUTH DAILY. 30 tablet 4  . pantoprazole (PROTONIX) 40 MG tablet Take 1 tablet  (40 mg total) by mouth daily. 30 tablet 3  . triamterene-hydrochlorothiazide (MAXZIDE-25) 37.5-25 MG tablet Take 1 tablet by mouth daily. 30 tablet 5  . valACYclovir (VALTREX) 1000 MG tablet Take one tablet by mouth TID 21 tablet 0  . valACYclovir (VALTREX) 1000 MG tablet Take 2 tablets po now then 2 tablet po 12 hours later 4 tablet 5   Social History   Socioeconomic History  . Marital status: Married    Spouse name: Not on file  . Number of children: Not on file  . Years of education: Not on file  . Highest education level: Not on file  Occupational History  . Not on file  Social Needs  . Financial resource strain: Not on file  . Food insecurity    Worry: Not on file    Inability: Not on file  . Transportation needs    Medical: Not on file    Non-medical: Not on file  Tobacco Use  . Smoking status: Current Some Day Smoker    Packs/day: 0.50    Years: 15.00    Pack years: 7.50    Types: Cigarettes  . Smokeless tobacco: Never Used  Substance and Sexual Activity  . Alcohol use: Yes    Comment: occ  . Drug use: No  . Sexual activity: Yes    Birth control/protection: Surgical    Comment: tubal and ablation  Lifestyle  .  Physical activity    Days per week: Not on file    Minutes per session: Not on file  . Stress: Not on file  Relationships  . Social Herbalist on phone: Not on file    Gets together: Not on file    Attends religious service: Not on file    Active member of club or organization: Not on file    Attends meetings of clubs or organizations: Not on file    Relationship status: Not on file  . Intimate partner violence    Fear of current or ex partner: Not on file    Emotionally abused: Not on file    Physically abused: Not on file    Forced sexual activity: Not on file  Other Topics Concern  . Not on file  Social History Narrative  . Not on file   Family History  Problem Relation Age of Onset  . Hypertension Mother   . Diabetes Mother    . Hyperlipidemia Mother   . Thyroid disease Mother   . Seizures Mother   . Hyperlipidemia Father   . Cancer Paternal Aunt        breast  . Cancer Maternal Grandmother        colon  . Cancer Paternal Grandmother        breast  . Cancer Paternal Aunt        breast  . Cancer Paternal Aunt        breast    OBJECTIVE:  Vitals:   12/05/18 1740  BP: (!) 157/109  Pulse: 85  Resp: 20  Temp: 97.7 F (36.5 C)  SpO2: 96%    General appearance: ALERT; appears uncomfortable, leaning forward in exam chair, but nontoxic Head: NCAT ENT: PERRL, EOM I grossly; oropharynx clear Lungs: Normal respiratory effort; CTAB CV: RRR Musculoskeletal: Back Inspection: Skin warm, dry, clear and intact without obvious erythema, effusion, or ecchymosis.  Palpation: Diffusely TTP over low back; possible spasm of lumbar paravertebral muscles ROM: FROM active and passive Strength: 5/5 shld abduction, 5/5 shld adduction, 5/5 elbow flexion, 5/5 elbow extension, 5/5 grip strength, 5/5 hip flexion, 5/5 knee abduction, 5/5 knee adduction, 5/5 knee flexion, 5/5 knee extension Skin: warm and dry Neurologic: Ambulates with minimal difficulty; Sensation intact about the upper/ lower extremities Psychological: alert and cooperative; normal mood and affect   ASSESSMENT & PLAN:  1. Acute bilateral low back pain without sciatica   2. Muscle spasm     Meds ordered this encounter  Medications  . methylPREDNISolone sodium succinate (SOLU-MEDROL) 125 mg/2 mL injection 125 mg  . predniSONE (DELTASONE) 20 MG tablet    Sig: Take 1 tablet (20 mg total) by mouth 2 (two) times daily with a meal for 5 days.    Dispense:  10 tablet    Refill:  0    Order Specific Question:   Supervising Provider    Answer:   Raylene Everts [id number]  . cyclobenzaprine (FLEXERIL) 10 MG tablet    Sig: Take 1 tablet (10 mg total) by mouth at bedtime.    Dispense:  15 tablet    Refill:  0    Order Specific Question:   Supervising  Provider    Answer:   Raylene Everts [id number]   Steroid shot given in office Continue conservative management of rest, ice, and gentle stretches Prednisone prescribed.  Take as directed and to completion Take cyclobenzaprine at nighttime for symptomatic relief. Avoid driving or operating heavy  machinery while using medication. Follow up with PCP if symptoms persist Return or go to the ER if you have any new or worsening symptoms (fever, chills, chest pain, abdominal pain, changes in bowel or bladder habits, pain radiating into lower legs, etc...)   Reviewed expectations re: course of current medical issues. Questions answered. Outlined signs and symptoms indicating need for more acute intervention. Patient verbalized understanding. After Visit Summary given.    Lestine Box, PA-C 12/05/18 1752

## 2018-12-05 NOTE — Discharge Instructions (Signed)
Steroid shot given in office Continue conservative management of rest, ice, and gentle stretches Prednisone prescribed.  Take as directed and to completion Take cyclobenzaprine at nighttime for symptomatic relief. Avoid driving or operating heavy machinery while using medication. Follow up with PCP if symptoms persist Return or go to the ER if you have any new or worsening symptoms (fever, chills, chest pain, abdominal pain, changes in bowel or bladder habits, pain radiating into lower legs, etc...)  

## 2018-12-15 ENCOUNTER — Encounter: Payer: Self-pay | Admitting: Family Medicine

## 2018-12-15 ENCOUNTER — Ambulatory Visit (HOSPITAL_COMMUNITY)
Admission: RE | Admit: 2018-12-15 | Discharge: 2018-12-15 | Disposition: A | Discharge status: Home / Self Care | Payer: 59 | Source: Ambulatory Visit | Attending: Family Medicine | Admitting: Family Medicine

## 2018-12-15 ENCOUNTER — Telehealth: Payer: Self-pay | Admitting: *Deleted

## 2018-12-15 ENCOUNTER — Other Ambulatory Visit: Payer: Self-pay

## 2018-12-15 ENCOUNTER — Ambulatory Visit: Payer: 59 | Admitting: Family Medicine

## 2018-12-15 VITALS — BP 126/74 | Temp 98.2°F | Ht 69.5 in | Wt 281.0 lb

## 2018-12-15 DIAGNOSIS — M545 Low back pain, unspecified: Secondary | ICD-10-CM

## 2018-12-15 LAB — POCT URINALYSIS DIPSTICK
Spec Grav, UA: <=1.005 — AB (ref 1.010–1.025)
pH, UA: 7 (ref 5.0–8.0)

## 2018-12-15 MED ORDER — HYDROCODONE-ACETAMINOPHEN 10-325 MG PO TABS: 1.0000 | ORAL_TABLET | ORAL | 0 refills | Status: DC | PRN

## 2018-12-15 MED ORDER — PREDNISONE 20 MG PO TABS: ORAL_TABLET | ORAL | 0 refills | Status: DC

## 2018-12-15 MED ORDER — HYDROCODONE-ACETAMINOPHEN 10-325 MG PO TABS: ORAL_TABLET | ORAL | 0 refills | Status: DC

## 2018-12-15 NOTE — Telephone Encounter (Signed)
I did redo the prescription and send it in if any ongoing trouble let me know

## 2018-12-15 NOTE — Telephone Encounter (Signed)
Pt seen today. Pharm calling stating hydrocodone needs to say no more than 5 per day on script.

## 2018-12-15 NOTE — Progress Notes (Signed)
   Subjective:    Patient ID: Carol Sharp, female    DOB: 02-Jun-1975, 43 y.o.   MRN: [id number]  Back Pain This is a new problem. Episode onset: 11 days. The pain is present in the lumbar spine. The symptoms are aggravated by bending and twisting. Treatments tried: prednisone and flexeril from urgent care.  pt fell a few days before back started hurting.  Patient states she fell landed on her knees.  She relates significant mid to low back pain does not radiate down the legs no numbness or weakness but at times the pain is at a 10/10 other x7 or 8 out of 10 patient denies any fever chills PMH benign Results for orders placed or performed in visit on 12/15/18  POCT urinalysis dipstick  Result Value Ref Range   Color, UA     Clarity, UA     Glucose, UA     Bilirubin, UA     Ketones, UA     Spec Grav, UA <=1.005 (A) 1.010 - 1.025   Blood, UA     pH, UA 7.0 5.0 - 8.0   Protein, UA     Urobilinogen, UA     Nitrite, UA     Leukocytes, UA     Appearance     Odor       Review of Systems  Musculoskeletal: Positive for back pain.  Relates low back pain denies upper back pain denies arm pain leg pain negative for any type of chest symptoms cardiac symptoms respiratory symptoms     Objective:   Physical Exam Upper back nontender to palpation mid back nontender to palpation lower back tender to palpation negative straight leg raise bilateral patient does have difficulty standing has decreased range of motion       Assessment & Plan:  Prednisone taper over the next 6 days Continue Mobic May use hydrocodone as needed for severe pain caution drowsiness Patient states pain is severe X-rays ordered If not dramatically better over the next week may need MRI of the lower back or physical therapy.  Call back if any problems Pain medicine not to be used if excessive drowsiness no driving with the medicine

## 2018-12-17 ENCOUNTER — Other Ambulatory Visit: Payer: Self-pay

## 2018-12-17 DIAGNOSIS — Z20822 Contact with and (suspected) exposure to covid-19: Secondary | ICD-10-CM

## 2018-12-18 LAB — NOVEL CORONAVIRUS, NAA: SARS-CoV-2, NAA: NOT DETECTED

## 2018-12-19 ENCOUNTER — Telehealth: Payer: Self-pay | Admitting: Family Medicine

## 2018-12-19 NOTE — Telephone Encounter (Signed)
Patient's husband and grandchild tested positive for Covid yesterday- patient had a slight runny nose but no other symptoms and her test came back negative.

## 2018-12-19 NOTE — Telephone Encounter (Signed)
Patient notified and verbalized understanding. 

## 2018-12-19 NOTE — Telephone Encounter (Signed)
She should assume she has it and behave accordingly

## 2018-12-19 NOTE — Telephone Encounter (Signed)
Pt states that her husband Carol Sharp) tested +Covid  Pt states that her test was negative & her employer won't let her return to work until he's better (even though she's been working from home)  Also states that they are "monitoring" her husband who is quarantining in a room alone, he did not go get x-ray because he didn't really feel like it

## 2018-12-23 ENCOUNTER — Telehealth: Payer: Self-pay | Admitting: Family Medicine

## 2018-12-23 NOTE — Telephone Encounter (Signed)
Patient advised per Dr Nicki Reaper: Now that the patient has low-grade fever runny nose and a cough it is advisable for her to be tested again  Unfortunately with Covid if a person has been exposed they can get sick with it even up to 2 weeks later So even though she had a negative test previously Given her circumstances it would be wise to test again Dr Nicki Reaper would recommend she go to the testing facility tomorrow Certainly if she is having progressive troubles such as breathing issues etc. she should touch base with [place] for virtual visit as well. Patient verbalized understanding.

## 2018-12-23 NOTE — Telephone Encounter (Signed)
Pt states her husband had +Covid test 12/17/2018 & she had a -Covid test 12/17/2018  Pt states she finished the prednisone we gave her for a pulled muscle & in the past day or two she's developed cough, low grade fever, runny nose  States she & her husband have been quarantining in separate rooms in the home  Please advise

## 2018-12-23 NOTE — Telephone Encounter (Signed)
Now that the patient has low-grade fever runny nose and a cough it is advisable for her to be tested again  Unfortunately with Covid if a person has been exposed they can get sick with it even up to 2 weeks later So even though she had a negative test previously Given her circumstances it would be wise to test again I would recommend she go to the testing facility tomorrow Certainly if she is having progressive troubles such as breathing issues etc. she should touch base with [place] for virtual visit as well

## 2018-12-24 ENCOUNTER — Ambulatory Visit (INDEPENDENT_AMBULATORY_CARE_PROVIDER_SITE_OTHER): Payer: 59 | Admitting: Family Medicine

## 2018-12-24 ENCOUNTER — Other Ambulatory Visit: Payer: Self-pay

## 2018-12-24 DIAGNOSIS — Z20828 Contact with and (suspected) exposure to other viral communicable diseases: Secondary | ICD-10-CM

## 2018-12-24 DIAGNOSIS — Z20822 Contact with and (suspected) exposure to covid-19: Secondary | ICD-10-CM

## 2018-12-24 MED ORDER — HYDROCODONE-HOMATROPINE 5-1.5 MG/5ML PO SYRP: 5.0000 mL | ORAL_SOLUTION | Freq: Four times a day (QID) | ORAL | 0 refills | Status: AC | PRN

## 2018-12-24 NOTE — Progress Notes (Signed)
   Subjective:    Patient ID: Carol Sharp, female    DOB: Apr 22, 1975, 43 y.o.   MRN: [id number]  Cough This is a new problem. The current episode started in the past 7 days. Associated symptoms include a fever and nasal congestion.  Patient with frequent coughing body aches headache chest muscles are sore from coughing denies shortness of breath She requests prescription for cough medication Husband and grandchildren positive for covid Loss of taste and smell  Review of Systems  Constitutional: Positive for fever.  Respiratory: Positive for cough.    Virtual Visit via Video Note  I connected with Carol Sharp on 12/24/18 at  9:30 AM EST by a video enabled telemedicine application and verified that I am speaking with the correct person using two identifiers.  Location: Patient: home Provider: office   I discussed the limitations of evaluation and management by telemedicine and the availability of in person appointments. The patient expressed understanding and agreed to proceed.  History of Present Illness:    Observations/Objective:   Assessment and Plan:   Follow Up Instructions:    I discussed the assessment and treatment plan with the patient. The patient was provided an opportunity to ask questions and all were answered. The patient agreed with the plan and demonstrated an understanding of the instructions.   The patient was advised to call back or seek an in-person evaluation if the symptoms worsen or if the condition fails to improve as anticipated.  I provided 15 minutes of non-face-to-face time during this encounter.        Objective:   Physical Exam Patient had virtual visit Appears to be in no distress Atraumatic Neuro able to relate and oriented No apparent resp distress Color normal Frequent coughing  Does not appear to be in respiratory distress     Assessment & Plan:  Suspected Covid infection Patient should stay home at least for 10  days Most likely the first day to return back to work would be November 16 If she needs a note she will let us know I also reviewed over with her the warning signs to watch for if she has progressive troubles or shortness of breath or just significantly worse over the next few days next step would be ER For now home isolation

## 2018-12-24 NOTE — Patient Instructions (Signed)
Carol Sharp  Here is some standard information regarding Covid infections Please read below       The patient was counseled regarding the following.  Possibility exists that patient may have Covid19.  This is a virus that causes severe flulike symptoms.  The majority of individuals have mild illness and are able to recover at home.  There is no antibiotic or treatment for this.  No local testing exists for this.  Patient was educated regarding the warning signs.  Warning signs include trouble breathing, passing out or near syncope, persistent pain or pressure in the chest, new confusion or difficulty to arouse, bluish lips, unable to keep liquids down.  If emergency symptoms are occurring then-if patient feels they are having medical emergency they are to call 911.  Otherwise they need to go to the emergency department.  For milder cases home care is the best approach.  Home care minimizes exposure of the infected patient to others.  It is wise to self isolate. 10 ways to manage respiratory symptoms at home-per CDC guidelines  #1 stay at home-stay home from work and away from other public places.  If having to go out it is important to avoid public transportation ride sharing etc. #2 monitor your symptoms carefully-if symptoms worsen or show signs of emergent issues ER care may be necessary.  If more routine issues may call office for advice. #3 stay rested and stay hydrated. #4 if medical emergencies call 911 and notify dispatch personnel that you may have [phone] #5 cover your cough and sneezes #6 wash your hands often with soap and water for at least 20 seconds or use alcohol based hand sanitizer that contains at least 60% alcohol #7 as much as possible stay in a specific room at your home and stay away from other people in your home.  If possible please use separate bathroom.  If you have to be around other people in or outside of the home wear a facemask. #8 avoid sharing personal items-such  as dishes, towels, bedding, do not drink after each other #9 clean all surfaces that are touched often like counters tabletops doorknobs use household cleaning sprays or wipes according to the label instructions #10 this illness can vary from person to person but most people over several days will gradually improve  Home self isolation guidelines CDC recommendations  Patients with symptoms consistent with Covid 19 should stay in self-isolation until: At least 3 days have passed since recovery-this is defined as no fevers (without medications), improvement in respiratory symptoms, and at least 10 days have passed since the symptoms first appeared.  Additional information available at http://www.wolf.info/ and  also www.C19check.com is a good website that educates when a person should consider going to the ER versus home care.  The patient would just have to put in various information and it will automatically give advice.  Should the patient need further advice from [place] to call.

## 2019-01-02 ENCOUNTER — Other Ambulatory Visit: Payer: Self-pay

## 2019-01-02 DIAGNOSIS — Z20822 Contact with and (suspected) exposure to covid-19: Secondary | ICD-10-CM

## 2019-01-05 LAB — NOVEL CORONAVIRUS, NAA: SARS-CoV-2, NAA: DETECTED — AB

## 2019-01-19 ENCOUNTER — Encounter: Payer: Self-pay | Admitting: Orthopedic Surgery

## 2019-01-19 ENCOUNTER — Ambulatory Visit: Payer: 59

## 2019-01-19 ENCOUNTER — Ambulatory Visit: Payer: 59 | Admitting: Orthopedic Surgery

## 2019-01-19 ENCOUNTER — Other Ambulatory Visit: Payer: Self-pay

## 2019-01-19 VITALS — BP 149/99 | HR 94 | Ht 69.5 in | Wt 275.0 lb

## 2019-01-19 DIAGNOSIS — M25561 Pain in right knee: Secondary | ICD-10-CM

## 2019-01-19 DIAGNOSIS — Z6841 Body Mass Index (BMI) 40.0 and over, adult: Secondary | ICD-10-CM

## 2019-01-19 MED ORDER — TRAMADOL-ACETAMINOPHEN 37.5-325 MG PO TABS: 1.0000 | ORAL_TABLET | ORAL | 0 refills | Status: AC | PRN

## 2019-01-19 NOTE — Patient Instructions (Addendum)
Use Aspercreme, Biofreeze or Voltaren gel over the counter 2-3 times daily make sure you rub it in well each time you use it.    Do home exercises take Meloxicam use pain medication as needed next 7 days

## 2019-01-19 NOTE — Progress Notes (Signed)
Carol Sharp  01/19/2019  Body mass index is 40.03 kg/m.   HISTORY SECTION :  Chief Complaint  Patient presents with  . Knee Pain    stepped down out of truck twisted right knee last week    HPI The patient presents for evaluation of  (mild/moderate/severe/ ) moderate pain, in the (right /left) right knee, for 1 week, associated with swelling and decreased flexion.  Prior treatment none already on meloxicam 15 mg daily.  She did try some knee exercises.  43 years old had an arthroscopy a couple years ago knee had been functioning well other than some popping but stepped out of a high truck hyperflexed her knee felt acute pain no pop and noticed inability to flex the knee fully after that.  She had some chronic lack of extension after the arthroscopy which was persistent.  She also noted crepitance after her surgery but that was nothing new   Review of Systems  Constitutional: Negative for fever.  Musculoskeletal: Negative for back pain.  Neurological: Negative for tingling.     has a past medical history of Anxiety, Arthritis, Breast nodule (08/02/2015), Depression, Hypertension, and MRSA cellulitis (May 2005).   Past Surgical History:  Procedure Laterality Date  . ABLATION  May 2010  . DILATION AND CURETTAGE OF UTERUS  Oct 2000  . KNEE ARTHROSCOPY WITH MEDIAL MENISECTOMY Right 04/18/2017   Procedure: RIGHT KNEE ARTHROSCOPY WITH PARTIAL  MEDIAL MENISECTOMY;  Surgeon: Carole Civil, MD;  Location: [facility];  Service: Orthopedics;  Laterality: Right;  . TONSILLECTOMY  1981?  . TUBAL LIGATION      Body mass index is 40.03 kg/m.   Allergies  Allergen Reactions  . Amlodipine     swelling     Current Outpatient Medications:  .  ALPRAZolam (XANAX) 1 MG tablet, TAKE 1 TABLET BY MOUTH AT BEDTIME AS NEEDED FOR SLEEP., Disp: 30 tablet, Rfl: 5 .  meloxicam (MOBIC) 15 MG tablet, TAKE ONE TABLET BY MOUTH DAILY., Disp: 30 tablet, Rfl: 4 .  pantoprazole (PROTONIX) 40 MG  tablet, Take 1 tablet (40 mg total) by mouth daily., Disp: 30 tablet, Rfl: 3 .  triamterene-hydrochlorothiazide (MAXZIDE-25) 37.5-25 MG tablet, Take 1 tablet by mouth daily., Disp: 30 tablet, Rfl: 5 .  traMADol-acetaminophen (ULTRACET) 37.5-325 MG tablet, Take 1 tablet by mouth every 4 (four) hours as needed for up to 7 days., Disp: 42 tablet, Rfl: 0 .  valACYclovir (VALTREX) 1000 MG tablet, Take one tablet by mouth TID (Patient not taking: Reported on 01/19/2019), Disp: 21 tablet, Rfl: 0 .  valACYclovir (VALTREX) 1000 MG tablet, Take 2 tablets po now then 2 tablet po 12 hours later (Patient not taking: Reported on 01/19/2019), Disp: 4 tablet, Rfl: 5   PHYSICAL EXAM SECTION: 1) BP (!) 149/99   Pulse 94   Ht 5' 9.5" (1.765 m)   Wt 275 lb (124.7 kg)   BMI 40.03 kg/m   Body mass index is 40.03 kg/m. General appearance: Well-developed well-nourished no gross deformities  2) Cardiovascular normal pulse and perfusion in the lower  extremities normal color without edema  3) Neurologically deep tendon reflexes are equal and normal, no sensation loss or deficits no pathologic reflexes  4) Psychological: Awake alert and oriented x3 mood and affect normal  5) Skin no lacerations or ulcerations no nodularity no palpable masses, no erythema or nodularity  6) Musculoskeletal:   Left knee full extension full flexion no tenderness full range of motion all ligaments feel stable muscle  tone is normal  Right knee does not come to full extension does not fully flex.  I can flex the knee and she can flex the knee up to about 95 degrees.  No joint effusion but tenderness over the iliotibial band and patellar tendon nontender over the quadriceps joint lines seem nonreactive ACL PCL medial and lateral collateral ligaments are stable excellent muscle tone skin is normal  The patient meets the AMA guidelines for Morbid (severe) obesity with a BMI > 40.0 and I have recommended weight loss.    MEDICAL  DECISION SECTION:  Encounter Diagnoses  Name Primary?  . Acute pain of right knee Yes  . Body mass index 40.0-44.9, adult ([place])   . Morbid obesity ([place])     Imaging X-ray in the office see the report she has symmetric joint space narrowing chronic she has osteophytes around the patella nothing acute no significant progression versus her last x-ray prior to surgery 2 or 3 years ago  Plan:  (Rx., Inj., surg., Frx, MRI/CT, XR:2)  Seems early to me it has only been a week recommend some quadriceps setting, short arc quads and closed chain knee flexion exercises to regain motion.  Continue the meloxicam at [facility] for pain  See me in 3 weeks for reexamination if no improvement then MRI may be warranted  Meds ordered this encounter  Medications  . traMADol-acetaminophen (ULTRACET) 37.5-325 MG tablet    Sig: Take 1 tablet by mouth every 4 (four) hours as needed for up to 7 days.    Dispense:  42 tablet    Refill:  0     5:15 PM Arther Abbott, MD  01/19/2019

## 2019-02-09 ENCOUNTER — Telehealth: Payer: Self-pay | Admitting: Orthopedic Surgery

## 2019-02-09 NOTE — Telephone Encounter (Signed)
Ok to [employer]  She can use any of the over the counter creams  I  called her to advise.

## 2019-02-09 NOTE — Telephone Encounter (Signed)
Aware. Also re-scheduled appointment.

## 2019-02-09 NOTE — Telephone Encounter (Signed)
Patient called [phone]) to relay (1) that she will need to re-schedule her 02/16/19 appointment, 'to either this week' or to 02/18/19. Also relays knee is really hurting, although she said she's done some home exercises. Also said tried Biofreeze, and it is not helping much. Said thought maybe Dr Aline Brochure may try another type of cream to apply?  Michela Pitcher can come this week if needs to.

## 2019-02-16 ENCOUNTER — Ambulatory Visit: Payer: 59 | Admitting: Orthopedic Surgery

## 2019-02-18 ENCOUNTER — Ambulatory Visit: Payer: 59 | Admitting: Orthopedic Surgery

## 2019-03-07 ENCOUNTER — Other Ambulatory Visit: Payer: Self-pay | Admitting: Family Medicine

## 2019-03-09 NOTE — Telephone Encounter (Signed)
May have this +1 additional refill will need follow-up medication visit in early March Please go ahead and pend the prescription thank you

## 2019-03-10 NOTE — Telephone Encounter (Signed)
Patient scheduled medication follow up for 3/5

## 2019-03-10 NOTE — Telephone Encounter (Signed)
Please schedule and then route back so we can send in refill. thanks

## 2019-03-10 NOTE — Telephone Encounter (Signed)
Med pended

## 2019-03-10 NOTE — Telephone Encounter (Signed)
lvm to schedule virtual visit in March

## 2019-04-10 ENCOUNTER — Other Ambulatory Visit: Payer: Self-pay | Admitting: Family Medicine

## 2019-04-10 NOTE — Telephone Encounter (Signed)
Meds pended. Had to pend all four could not sign off on the other 3 and not xanax.

## 2019-04-10 NOTE — Telephone Encounter (Signed)
May have 30 days on each 1 of these please pend the Xanax needs virtual visit or in person follow-up visit

## 2019-04-10 NOTE — Telephone Encounter (Signed)
Scheduled 3/2

## 2019-04-21 ENCOUNTER — Other Ambulatory Visit: Payer: Self-pay

## 2019-04-21 ENCOUNTER — Ambulatory Visit (INDEPENDENT_AMBULATORY_CARE_PROVIDER_SITE_OTHER): Payer: 59 | Admitting: Family Medicine

## 2019-04-21 DIAGNOSIS — G4700 Insomnia, unspecified: Secondary | ICD-10-CM

## 2019-04-21 DIAGNOSIS — F419 Anxiety disorder, unspecified: Secondary | ICD-10-CM | POA: Diagnosis not present

## 2019-04-21 DIAGNOSIS — I10 Essential (primary) hypertension: Secondary | ICD-10-CM

## 2019-04-21 DIAGNOSIS — Z79899 Other long term (current) drug therapy: Secondary | ICD-10-CM | POA: Diagnosis not present

## 2019-04-21 MED ORDER — ALPRAZOLAM 1 MG PO TABS: 1.0000 mg | ORAL_TABLET | Freq: Every evening | ORAL | 5 refills | Status: DC | PRN

## 2019-04-21 MED ORDER — PANTOPRAZOLE SODIUM 40 MG PO TBEC: 40.0000 mg | DELAYED_RELEASE_TABLET | Freq: Every day | ORAL | 5 refills | Status: DC

## 2019-04-21 MED ORDER — FLUCONAZOLE 200 MG PO TABS: ORAL_TABLET | ORAL | 0 refills | Status: DC

## 2019-04-21 MED ORDER — FLUCONAZOLE 150 MG PO TABS: 150.0000 mg | ORAL_TABLET | Freq: Once | ORAL | 2 refills | Status: DC

## 2019-04-21 MED ORDER — TRIAMTERENE-HCTZ 37.5-25 MG PO TABS: 1.0000 | ORAL_TABLET | Freq: Every day | ORAL | 5 refills | Status: DC

## 2019-04-21 MED ORDER — SERTRALINE HCL 50 MG PO TABS: 50.0000 mg | ORAL_TABLET | Freq: Every day | ORAL | 5 refills | Status: DC

## 2019-04-21 MED ORDER — VALACYCLOVIR HCL 1 G PO TABS: ORAL_TABLET | ORAL | 5 refills | Status: DC

## 2019-04-21 NOTE — Progress Notes (Signed)
Subjective:    Patient ID: Carol Sharp, female    DOB: 1975-08-14, 44 y.o.   MRN: [id number] Audiovisual Hypertension This is a chronic problem. Pertinent negatives include no chest pain or shortness of breath. There are no compliance problems.   pt is taking Maxzide 37.5-25 mg daily as prescribed. No issues or concerns with BP.  Patient also relates every time she has relations with her husband she gets a yeast infection he has diabetes and often has yeast infections on the penile region. Pt states she has been having some vaginal discharge for a couple of weeks. Pt states she is not sure if her partner had a yeast infection or if she had a yeast infection or if this is vaginitis. Pt states she took a Valtrex and Valtrex gives her yeast infection. Pt would like a 7 day course of Diflucan.   Virtual Visit via Telephone Note  I connected with Carol Sharp on 04/21/19 at 10:00 AM EST by telephone and verified that I am speaking with the correct person using two identifiers.  Location: Patient: home Provider: office   I discussed the limitations, risks, security and privacy concerns of performing an evaluation and management service by telephone and the availability of in person appointments. I also discussed with the patient that there may be a patient responsible charge related to this service. The patient expressed understanding and agreed to proceed.   History of Present Illness:    Observations/Objective:   Assessment and Plan:   Follow Up Instructions:    I discussed the assessment and treatment plan with the patient. The patient was provided an opportunity to ask questions and all were answered. The patient agreed with the plan and demonstrated an understanding of the instructions.   The patient was advised to call back or seek an in-person evaluation if the symptoms worsen or if the condition fails to improve as anticipated.  I provided 17 minutes of  non-face-to-face time during this encounter.        Review of Systems  Constitutional: Negative for activity change, appetite change and fatigue.  HENT: Negative for congestion and rhinorrhea.   Respiratory: Negative for cough and shortness of breath.   Cardiovascular: Negative for chest pain and leg swelling.  Gastrointestinal: Negative for abdominal pain and diarrhea.  Endocrine: Negative for polydipsia and polyphagia.  Skin: Negative for color change.  Neurological: Negative for dizziness and weakness.  Psychiatric/Behavioral: Negative for behavioral problems and confusion.   Yeast related issues in the vaginal region    Objective:   Physical Exam   Virtual exam unable to do physical exam     Assessment & Plan:  1. Anxiety May use Xanax on a as needed basis does not abuse it. Because an underlying health issues she has been having with anxiety and sometimes panic feeling we will go ahead and use sertraline She states some depression symptoms but denies being depressed We will follow her up again in 4 weeks with a phone call  2. Benign essential hypertension Blood pressure good control continue current measures check lab work. - Basic Metabolic Panel (BMET) - Lipid Profile - Hepatic function panel  3. Insomnia, unspecified type May use Xanax at nighttime when necessary not for every evening use if possible  4. High risk medication use Check lab work - Biomedical engineer Panel (BMET) - Lipid Profile - Hepatic function panel Patient is working hard at trying to lose weight In person follow-up in 6 months

## 2019-04-24 ENCOUNTER — Ambulatory Visit: Payer: 59 | Admitting: Family Medicine

## 2019-05-20 ENCOUNTER — Other Ambulatory Visit: Payer: Self-pay

## 2019-05-20 ENCOUNTER — Ambulatory Visit (INDEPENDENT_AMBULATORY_CARE_PROVIDER_SITE_OTHER): Payer: 59 | Admitting: Family Medicine

## 2019-05-20 DIAGNOSIS — R7401 Elevation of levels of liver transaminase levels: Secondary | ICD-10-CM | POA: Diagnosis not present

## 2019-05-20 DIAGNOSIS — F411 Generalized anxiety disorder: Secondary | ICD-10-CM | POA: Diagnosis not present

## 2019-05-20 DIAGNOSIS — E785 Hyperlipidemia, unspecified: Secondary | ICD-10-CM

## 2019-05-20 LAB — HEPATIC FUNCTION PANEL
ALT: 49 IU/L — ABNORMAL HIGH (ref 0–32)
AST: 25 IU/L (ref 0–40)
Albumin: 4.5 g/dL (ref 3.8–4.8)
Alkaline Phosphatase: 68 IU/L (ref 39–117)
Bilirubin Total: 0.4 mg/dL (ref 0.0–1.2)
Bilirubin, Direct: 0.12 mg/dL (ref 0.00–0.40)
Total Protein: 6.5 g/dL (ref 6.0–8.5)

## 2019-05-20 LAB — LIPID PANEL
Chol/HDL Ratio: 4.8 ratio — ABNORMAL HIGH (ref 0.0–4.4)
Cholesterol, Total: 245 mg/dL — ABNORMAL HIGH (ref 100–199)
HDL: 51 mg/dL (ref >39)
LDL Chol Calc (NIH): 143 mg/dL — ABNORMAL HIGH (ref 0–99)
Triglycerides: 283 mg/dL — ABNORMAL HIGH (ref 0–149)
VLDL Cholesterol Cal: 51 mg/dL — ABNORMAL HIGH (ref 5–40)

## 2019-05-20 LAB — BASIC METABOLIC PANEL
BUN/Creatinine Ratio: 15 (ref 9–23)
BUN: 13 mg/dL (ref 6–24)
CO2: 25 mmol/L (ref 20–29)
Calcium: 9.4 mg/dL (ref 8.7–10.2)
Chloride: 100 mmol/L (ref 96–106)
Creatinine, Ser: 0.85 mg/dL (ref 0.57–1.00)
GFR calc Af Amer: 96 mL/min/{1.73_m2} (ref >59)
GFR calc non Af Amer: 84 mL/min/{1.73_m2} (ref >59)
Glucose: 99 mg/dL (ref 65–99)
Potassium: 4 mmol/L (ref 3.5–5.2)
Sodium: 139 mmol/L (ref 134–144)

## 2019-05-20 MED ORDER — PHENTERMINE HCL 37.5 MG PO CAPS: ORAL_CAPSULE | ORAL | 2 refills | Status: DC

## 2019-05-20 NOTE — Progress Notes (Signed)
Subjective:    Patient ID: Carol Sharp, female    DOB: [birth year], 44 y.o.   MRN: [id number]  HPI Patient call today for follow on Anxiety. Patient states she is doing well on Zoloft.  Results for orders placed or performed in visit on [id number]  Basic Metabolic Panel (BMET)  Result Value Ref Range   Glucose 99 65 - 99 mg/dL   BUN 13 6 - 24 mg/dL   Creatinine, Ser 0.85 0.57 - 1.00 mg/dL   GFR calc non Af Amer 84 >59 mL/min/1.73   GFR calc Af Amer 96 >59 mL/min/1.73   BUN/Creatinine Ratio 15 9 - 23   Sodium 139 134 - 144 mmol/L   Potassium 4.0 3.5 - 5.2 mmol/L   Chloride 100 96 - 106 mmol/L   CO2 25 20 - 29 mmol/L   Calcium 9.4 8.7 - 10.2 mg/dL  Lipid Profile  Result Value Ref Range   Cholesterol, Total 245 (H) 100 - 199 mg/dL   Triglycerides 283 (H) 0 - 149 mg/dL   HDL 51 >39 mg/dL   VLDL Cholesterol Cal 51 (H) 5 - 40 mg/dL   LDL Chol Calc (NIH) 143 (H) 0 - 99 mg/dL   Chol/HDL Ratio 4.8 (H) 0.0 - 4.4 ratio  Hepatic function panel  Result Value Ref Range   Total Protein 6.5 6.0 - 8.5 g/dL   Albumin 4.5 3.8 - 4.8 g/dL   Bilirubin Total 0.4 0.0 - 1.2 mg/dL   Bilirubin, Direct 0.12 0.00 - 0.40 mg/dL   Alkaline Phosphatase 68 39 - 117 IU/L   AST 25 0 - 40 IU/L   ALT 49 (H) 0 - 32 IU/L  Patient states that time she feels on the verge of having a panic attack but she keeps under pretty good control by keeping her moods doing well as best as possible under a lot of stress though. We did discuss whether or not to increase the medication currently she would not want to do so she will give [place] an update in several weeks She also relates moderate obesity related to unhealthy eating during the pandemic she will work hard on trying to do better with she is interested in trying Adipex she is used it for I did review labs with him including elevated cholesterol importance of healthy diet regular activity plus also slight elevation of liver enzyme indicating probable fatty liver will  monitor  Virtual Visit via Video Note  I connected with Carol Sharp on 05/20/19 at 10:00 AM EDT by a video enabled telemedicine application and verified that I am speaking with the correct person using two identifiers.  Location: Patient: home Provider: office    I discussed the limitations of evaluation and management by telemedicine and the availability of in person appointments. The patient expressed understanding and agreed to proceed.  History of Present Illness:    Observations/Objective:   Assessment and Plan:   Follow Up Instructions:    I discussed the assessment and treatment plan with the patient. The patient was provided an opportunity to ask questions and all were answered. The patient agreed with the plan and demonstrated an understanding of the instructions.   The patient was advised to call back or seek an in-person evaluation if the symptoms worsen or if the condition fails to improve as anticipated.  I provided 24 minutes of non-face-to-face time during this encounter.      Review of Systems     Objective:   Physical Exam  Patient had virtual visit Appears to be in no distress Atraumatic Neuro able to relate and oriented No apparent resp distress Color normal      Assessment & Plan:  1. GAD (generalized anxiety disorder) Doing better on the Zoloft she would like to keep at the current dose does not want to go up at this point she will follow up again in 4 to 6 months  2. Hyperlipidemia, unspecified hyperlipidemia type Cholesterol moderately elevated healthy diet recommended repeat it again in the fall time if still elevated may consider medications depending on the numbers  3. Elevated transaminase level Liver enzymes slightly elevated more than likely fatty liver very important with healthy choices activity and losing weight  4. Morbid obesity ([place]) Morbid obesity healthy choices fluid water intake regular activity trying to bring  weight down all discussed Adipex utilize over the course of the next 3 months as long as it does not cause increased anxiousness

## 2019-07-01 ENCOUNTER — Encounter: Payer: Self-pay | Admitting: Family Medicine

## 2019-07-01 NOTE — Telephone Encounter (Signed)
Mikey Kirschner, MD     I'm sorry, with new state regs regarding narcotics, we no longer do hydrocodone prescriptions with mychart request like this, and we usually do not prescribe for f to f visits either

## 2019-07-02 ENCOUNTER — Ambulatory Visit: Payer: 59 | Admitting: Family Medicine

## 2019-07-02 ENCOUNTER — Encounter: Payer: Self-pay | Admitting: Family Medicine

## 2019-07-08 ENCOUNTER — Encounter: Payer: Self-pay | Admitting: Family Medicine

## 2019-07-08 ENCOUNTER — Other Ambulatory Visit: Payer: Self-pay

## 2019-07-08 ENCOUNTER — Ambulatory Visit (INDEPENDENT_AMBULATORY_CARE_PROVIDER_SITE_OTHER): Payer: 59 | Admitting: Family Medicine

## 2019-07-08 VITALS — BP 142/86 | HR 88 | Temp 98.2°F | Wt 268.4 lb

## 2019-07-08 DIAGNOSIS — M1711 Unilateral primary osteoarthritis, right knee: Secondary | ICD-10-CM | POA: Diagnosis not present

## 2019-07-08 DIAGNOSIS — M25561 Pain in right knee: Secondary | ICD-10-CM | POA: Diagnosis not present

## 2019-07-08 MED ORDER — TRAMADOL HCL 50 MG PO TABS: 50.0000 mg | ORAL_TABLET | Freq: Three times a day (TID) | ORAL | 0 refills | Status: AC | PRN

## 2019-07-08 NOTE — Progress Notes (Signed)
Patient ID: Carol Sharp, female    DOB: May 22, 1975, 44 y.o.   MRN: [id number]   Chief Complaint  Patient presents with  . Knee Pain   Subjective:    HPI  Patient comes in today with complaints of ongoing right knee pain that she has been using Voltaren gel on. Patient states yesterday she over stepped and knee fell into a bench. Now pain is radiating from her lower back down right leg with slight swelling.   Hurt rt knee last week with a "jamming" injury.  Went out of town, missed appt last week pcp for the knee pain. Came down on left leg and landed wrong coming out of camper. Rt went more 90 deg flexion and hurt the rt knee.  Didn't land directly on the rt knee.  3 days ago, went in yard and stepped over a "wood plank" and dragged her foot and then felt a "pop"  and caught herself from falling. Didn't land on the knee and caught her hands.  Then feeling back pain in rt lower back, since then.  No direct trauma to back or dysuria/hematuria.  Used voltaren on knee while out of town.  Taking mobic daily, but didn't take it today.  Seen by Dr. Aline Brochure for rt knee surgery arthroscopy, 4 yrs ago.  was given ultracet in past by dr. Aline Brochure in 11/20.  Medical History Carol Sharp has a past medical history of Anxiety, Arthritis, Breast nodule (08/02/2015), Depression, Hypertension, and MRSA cellulitis (May 2005).   Outpatient Encounter Medications as of 07/08/2019  Medication Sig  . ALPRAZolam (XANAX) 1 MG tablet Take 1 tablet (1 mg total) by mouth at bedtime as needed. for sleep  . fluconazole (DIFLUCAN) 200 MG tablet Take one tablet po each day for 7 days (Patient not taking: Reported on 05/20/2019)  . meloxicam (MOBIC) 15 MG tablet TAKE ONE TABLET BY MOUTH DAILY.  Marland Kitchen phentermine 37.5 MG capsule Take 1 tablet every morning before meal.  . sertraline (ZOLOFT) 50 MG tablet Take 1 tablet (50 mg total) by mouth daily.  . traMADol (ULTRAM) 50 MG tablet Take 1 tablet (50 mg total) by mouth  every 8 (eight) hours as needed for up to 5 days.  Marland Kitchen triamterene-hydrochlorothiazide (MAXZIDE-25) 37.5-25 MG tablet Take 1 tablet by mouth daily.  . valACYclovir (VALTREX) 1000 MG tablet Take 2 tablets po now then 2 tablet po 12 hours later   No facility-administered encounter medications on file as of 07/08/2019.     Review of Systems  Constitutional: Negative for chills and fever.  Musculoskeletal: Positive for arthralgias (rt knee pain), back pain and joint swelling.  Skin: Negative for rash and wound.  Neurological: Negative for dizziness, syncope, weakness and numbness.     Vitals BP (!) 142/86   Pulse 88   Temp 98.2 F (36.8 C)   Wt 268 lb 6.4 oz (121.7 kg)   SpO2 98%   BMI 39.07 kg/m   Objective:   Physical Exam Vitals and nursing note reviewed.  Constitutional:      General: She is in acute distress (mild due to pain).     Appearance: Normal appearance. She is obese. She is not ill-appearing.  HENT:     Head: Normocephalic and atraumatic.  Musculoskeletal:        General: Tenderness present. No deformity or signs of injury.     Right lower leg: No edema.     Left lower leg: No edema.     Comments: Dec  rom with rt knee, small ecchymosis on suprapatellar area. No erythema, or swelling.  Back- neg SLR bilaterally. No rash, normal inspection, no ttp over spinous processes. +ttp rt lumbar paraspinal area and down to rt buttock.  Skin:    General: Skin is warm and dry.     Findings: Bruising (rt knee) present. No erythema, lesion or rash.  Neurological:     General: No focal deficit present.     Mental Status: She is alert and oriented to person, place, and time.     Cranial Nerves: No cranial nerve deficit.     Sensory: No sensory deficit.     Motor: No weakness.  Psychiatric:        Mood and Affect: Mood normal.        Behavior: Behavior normal.      Assessment and Plan   1. Acute pain of right knee - DG Knee Complete 4 Views Right; Future  2.  Primary osteoarthritis of right knee    Advising to take tramadol prn for pain, rest, ice, elevation. Avoid taking xanax within 8 hrs of tramadol. Pt stating only taking xanax prn at night.  Cont meloxicam.    Reviewed pmp. Xray rt knee ordered.  Reviewed xray in 11/20- showing tricompartmental arthritis.  F/u prn.  Pt in agreement.

## 2019-07-10 ENCOUNTER — Telehealth: Payer: Self-pay | Admitting: Family Medicine

## 2019-07-10 NOTE — Telephone Encounter (Signed)
Spoke with patient. (Pt request Carol Sharp to address this message) Pt states she was going to get her xrays done but they have admitted her husband to [facility] for possible heart attack and she has been dealing with her [facility]. Pt states she is going to try to have xrays done today, if she gets out of hospital before 5. Pt states she is already taking Mobic. Pt has also used Ibuprofen and Volteran. Pt states she really needs something to help with pain called into [place]. Please advise. Thank you.

## 2019-07-10 NOTE — Telephone Encounter (Signed)
Also discussed this with pt, that not able to give opiates for her pain due to on controlled substances wt loss meds/anxiety meds.  So pt needs to see pcp for further pain meds if needed. Tyelnol 1000mg  tid and mobic and rest/ice the knee.  Needs to obtain xrays.   Thx.   Dr. Lovena Le

## 2019-07-10 NOTE — Telephone Encounter (Signed)
Carol Sharp, this came to me but since you are here today, thought this should come to you since you saw her. Thanks, Hoyle Sauer

## 2019-07-10 NOTE — Telephone Encounter (Signed)
Pt needs to get xrays, hasn't had them completed yet.  If unable to take tramadol then take mobic and add 1000mg  tylenol 3x per day and rest/ice elevation.  F/u with Dr. Nicki Reaper next week or we can refer to orthopedics.   Thx.   Dr. Lovena Le

## 2019-07-10 NOTE — Telephone Encounter (Signed)
Dr. Nicki Reaper,   Please see notes on pt requesting opiate pain medications after I told the patient "no" in the office due to pt already on other controlled substances, needing to get imaging and use tramadol and mobic and ice the knee.   Minimal knee injury without direct injury to the knee or deformity.  Pt has requested 2x now for note to go to [place] to address her request for pain meds called in.  Pt was told 2x now that she's not going to get stronger opiate pain medications from me at this time.  I advised pt can get referral to ortho if needed.  Want to make you aware of the possible undermining of providers if this continues with pt going to other providers for pain meds.  Thanks,   Dr. Lovena Le

## 2019-07-10 NOTE — Telephone Encounter (Signed)
Pt was seen by Dr. Lovena Le the other day and the medication that was prescribed pt states is not working and is causing her to itch and have headaches. She would like the pain medication she has received in the past that Hoyle Sauer has called in. She is requesting this note be sent to [facility].   Tramadol is the medication that was prescribed   [place], [place]

## 2019-07-10 NOTE — Telephone Encounter (Signed)
Please see note. Thanks

## 2019-07-10 NOTE — Telephone Encounter (Signed)
Please advise. Thank you

## 2019-10-08 ENCOUNTER — Other Ambulatory Visit: Payer: Self-pay | Admitting: Family Medicine

## 2019-10-08 NOTE — Telephone Encounter (Signed)
Patient may have this +1 additional refill needs to keep follow-up visit

## 2019-10-08 NOTE — Telephone Encounter (Signed)
Pt needs refills on meloxicam (MOBIC) 15 MG tablet,  triamterene-hydrochlorothiazide (MAXZIDE-25) 37.5-25 MG, phentermine 37.5 MG capsule, ALPRAZolam (XANAX) 1 MG tablet, sertraline (ZOLOFT) 50 MG tablet   Med Follow Up appt 09/21 at 2  Needs to be sent to  [place], [place]   Pt call [phone]

## 2019-10-08 NOTE — Addendum Note (Signed)
Addended by: Dairl Ponder on: 10/08/2019 03:07 PM   Modules accepted: Orders

## 2019-10-08 NOTE — Addendum Note (Signed)
Addended by: Patsy Lager on: 10/08/2019 09:37 AM   Modules accepted: Orders

## 2019-10-09 MED ORDER — ALPRAZOLAM 1 MG PO TABS: 1.0000 mg | ORAL_TABLET | Freq: Every evening | ORAL | 0 refills | Status: DC | PRN

## 2019-10-09 MED ORDER — TRIAMTERENE-HCTZ 37.5-25 MG PO TABS: 1.0000 | ORAL_TABLET | Freq: Every day | ORAL | 0 refills | Status: DC

## 2019-10-09 MED ORDER — SERTRALINE HCL 50 MG PO TABS: 50.0000 mg | ORAL_TABLET | Freq: Every day | ORAL | 0 refills | Status: DC

## 2019-10-09 MED ORDER — PHENTERMINE HCL 37.5 MG PO CAPS: ORAL_CAPSULE | ORAL | 0 refills | Status: DC

## 2019-10-09 MED ORDER — MELOXICAM 15 MG PO TABS: 15.0000 mg | ORAL_TABLET | Freq: Every day | ORAL | 0 refills | Status: DC

## 2019-10-09 NOTE — Addendum Note (Signed)
Addended by: Sallee Lange A on: 10/09/2019 09:31 AM   Modules accepted: Orders

## 2019-10-19 ENCOUNTER — Other Ambulatory Visit: Payer: Self-pay

## 2019-10-19 ENCOUNTER — Ambulatory Visit: Payer: 59 | Admitting: Family Medicine

## 2019-10-19 ENCOUNTER — Encounter: Payer: Self-pay | Admitting: Family Medicine

## 2019-10-19 VITALS — BP 146/94 | HR 87 | Temp 97.1°F | Wt 267.0 lb

## 2019-10-19 DIAGNOSIS — M5431 Sciatica, right side: Secondary | ICD-10-CM

## 2019-10-19 MED ORDER — TRIAMTERENE-HCTZ 37.5-25 MG PO TABS: 1.0000 | ORAL_TABLET | Freq: Every day | ORAL | 4 refills | Status: DC

## 2019-10-19 MED ORDER — HYDROCODONE-ACETAMINOPHEN 10-325 MG PO TABS: 1.0000 | ORAL_TABLET | Freq: Four times a day (QID) | ORAL | 0 refills | Status: DC | PRN

## 2019-10-19 MED ORDER — MELOXICAM 15 MG PO TABS: 15.0000 mg | ORAL_TABLET | Freq: Every day | ORAL | 5 refills | Status: DC

## 2019-10-19 MED ORDER — SERTRALINE HCL 50 MG PO TABS: 50.0000 mg | ORAL_TABLET | Freq: Every day | ORAL | 4 refills | Status: DC

## 2019-10-19 MED ORDER — PREDNISONE 20 MG PO TABS: ORAL_TABLET | ORAL | 0 refills | Status: DC

## 2019-10-19 NOTE — Patient Instructions (Signed)
DASH Eating Plan DASH stands for "Dietary Approaches to Stop Hypertension." The DASH eating plan is a healthy eating plan that has been shown to reduce high blood pressure (hypertension). It may also reduce your risk for type 2 diabetes, heart disease, and stroke. The DASH eating plan may also help with weight loss. What are tips for following this plan?  General guidelines  Avoid eating more than 2,300 mg (milligrams) of salt (sodium) a day. If you have hypertension, you may need to reduce your sodium intake to 1,500 mg a day.  Limit alcohol intake to no more than 1 drink a day for nonpregnant women and 2 drinks a day for men. One drink equals 12 oz of beer, 5 oz of wine, or 1 oz of hard liquor.  Work with your health care provider to maintain a healthy body weight or to lose weight. Ask what an ideal weight is for you.  Get at least 30 minutes of exercise that causes your heart to beat faster (aerobic exercise) most days of the week. Activities may include walking, swimming, or biking.  Work with your health care provider or diet and nutrition specialist (dietitian) to adjust your eating plan to your individual calorie needs. Reading food labels   Check food labels for the amount of sodium per serving. Choose foods with less than 5 percent of the Daily Value of sodium. Generally, foods with less than 300 mg of sodium per serving fit into this eating plan.  To find whole grains, look for the word "whole" as the first word in the ingredient list. Shopping  Buy products labeled as "low-sodium" or "no salt added."  Buy fresh foods. Avoid canned foods and premade or frozen meals. Cooking  Avoid adding salt when cooking. Use salt-free seasonings or herbs instead of table salt or sea salt. Check with your health care provider or pharmacist before using salt substitutes.  Do not fry foods. Cook foods using healthy methods such as baking, boiling, grilling, and broiling instead.  Cook with  heart-healthy oils, such as olive, canola, soybean, or sunflower oil. Meal planning  Eat a balanced diet that includes: ? 5 or more servings of fruits and vegetables each day. At each meal, try to fill half of your plate with fruits and vegetables. ? Up to 6-8 servings of whole grains each day. ? Less than 6 oz of lean meat, poultry, or fish each day. A 3-oz serving of meat is about the same size as a deck of cards. One egg equals 1 oz. ? 2 servings of low-fat dairy each day. ? A serving of nuts, seeds, or beans 5 times each week. ? Heart-healthy fats. Healthy fats called Omega-3 fatty acids are found in foods such as flaxseeds and coldwater fish, like sardines, salmon, and mackerel.  Limit how much you eat of the following: ? Canned or prepackaged foods. ? Food that is high in trans fat, such as fried foods. ? Food that is high in saturated fat, such as fatty meat. ? Sweets, desserts, sugary drinks, and other foods with added sugar. ? Full-fat dairy products.  Do not salt foods before eating.  Try to eat at least 2 vegetarian meals each week.  Eat more home-cooked food and less restaurant, buffet, and fast food.  When eating at a restaurant, ask that your food be prepared with less salt or no salt, if possible. What foods are recommended? The items listed may not be a complete list. Talk with your dietitian about   what dietary choices are best for you. Grains Whole-grain or whole-wheat bread. Whole-grain or whole-wheat pasta. Brown rice. Oatmeal. Quinoa. Bulgur. Whole-grain and low-sodium cereals. Pita bread. Low-fat, low-sodium crackers. Whole-wheat flour tortillas. Vegetables Fresh or frozen vegetables (raw, steamed, roasted, or grilled). Low-sodium or reduced-sodium tomato and vegetable juice. Low-sodium or reduced-sodium tomato sauce and tomato paste. Low-sodium or reduced-sodium canned vegetables. Fruits All fresh, dried, or frozen fruit. Canned fruit in natural juice (without  added sugar). Meat and other protein foods Skinless chicken or turkey. Ground chicken or turkey. Pork with fat trimmed off. Fish and seafood. Egg whites. Dried beans, peas, or lentils. Unsalted nuts, nut butters, and seeds. Unsalted canned beans. Lean cuts of beef with fat trimmed off. Low-sodium, lean deli meat. Dairy Low-fat (1%) or fat-free (skim) milk. Fat-free, low-fat, or reduced-fat cheeses. Nonfat, low-sodium ricotta or cottage cheese. Low-fat or nonfat yogurt. Low-fat, low-sodium cheese. Fats and oils Soft margarine without trans fats. Vegetable oil. Low-fat, reduced-fat, or light mayonnaise and salad dressings (reduced-sodium). Canola, safflower, olive, soybean, and sunflower oils. Avocado. Seasoning and other foods Herbs. Spices. Seasoning mixes without salt. Unsalted popcorn and pretzels. Fat-free sweets. What foods are not recommended? The items listed may not be a complete list. Talk with your dietitian about what dietary choices are best for you. Grains Baked goods made with fat, such as croissants, muffins, or some breads. Dry pasta or rice meal packs. Vegetables Creamed or fried vegetables. Vegetables in a cheese sauce. Regular canned vegetables (not low-sodium or reduced-sodium). Regular canned tomato sauce and paste (not low-sodium or reduced-sodium). Regular tomato and vegetable juice (not low-sodium or reduced-sodium). Pickles. Olives. Fruits Canned fruit in a light or heavy syrup. Fried fruit. Fruit in cream or butter sauce. Meat and other protein foods Fatty cuts of meat. Ribs. Fried meat. Bacon. Sausage. Bologna and other processed lunch meats. Salami. Fatback. Hotdogs. Bratwurst. Salted nuts and seeds. Canned beans with added salt. Canned or smoked fish. Whole eggs or egg yolks. Chicken or turkey with skin. Dairy Whole or 2% milk, cream, and half-and-half. Whole or full-fat cream cheese. Whole-fat or sweetened yogurt. Full-fat cheese. Nondairy creamers. Whipped toppings.  Processed cheese and cheese spreads. Fats and oils Butter. Stick margarine. Lard. Shortening. Ghee. Bacon fat. Tropical oils, such as coconut, palm kernel, or palm oil. Seasoning and other foods Salted popcorn and pretzels. Onion salt, garlic salt, seasoned salt, table salt, and sea salt. Worcestershire sauce. Tartar sauce. Barbecue sauce. Teriyaki sauce. Soy sauce, including reduced-sodium. Steak sauce. Canned and packaged gravies. Fish sauce. Oyster sauce. Cocktail sauce. Horseradish that you find on the shelf. Ketchup. Mustard. Meat flavorings and tenderizers. Bouillon cubes. Hot sauce and Tabasco sauce. Premade or packaged marinades. Premade or packaged taco seasonings. Relishes. Regular salad dressings. Where to find more information:  National Heart, Lung, and Blood Institute: www.nhlbi.nih.gov  American Heart Association: www.heart.org Summary  The DASH eating plan is a healthy eating plan that has been shown to reduce high blood pressure (hypertension). It may also reduce your risk for type 2 diabetes, heart disease, and stroke.  With the DASH eating plan, you should limit salt (sodium) intake to 2,300 mg a day. If you have hypertension, you may need to reduce your sodium intake to 1,500 mg a day.  When on the DASH eating plan, aim to eat more fresh fruits and vegetables, whole grains, lean proteins, low-fat dairy, and heart-healthy fats.  Work with your health care provider or diet and nutrition specialist (dietitian) to adjust your eating plan to your   individual calorie needs. This information is not intended to replace advice given to you by your health care provider. Make sure you discuss any questions you have with your health care provider. Document Revised: 01/18/2017 Document Reviewed: 01/30/2016 Elsevier Patient Education  2020 Elsevier Inc.  

## 2019-10-19 NOTE — Progress Notes (Signed)
   Subjective:    Patient ID: Carol Sharp, female    DOB: [birth year], 44 y.o.   MRN: [id number]  Back Pain This is a new problem. Episode onset: Saturday while lifting some boards.  The problem has been gradually worsening since onset. Pain location: RIght lower back and radiates down right leg.   Patient states she was lifting some lumbar she picked it up she turned and she felt a pull in her back radiating down the right leg hurts with certain movements.  Relates a lot of stiffness tightness.  Denies any loss of bowel or bladder control   Review of Systems  Musculoskeletal: Positive for back pain.  See above     Objective:   Physical Exam  Lumbar pain noted on palpation.  Positive straight leg raise on the right negative on the left lungs clear heart regular flanks nontender      Assessment & Plan:  Right sciatica Prednisone taper Pain medication as needed not for long-term use No x-rays MRI indicated Warning signs discussed Follow-up if not dramatically better over the course the next 8 to 9 weeks sooner if any problems

## 2019-10-20 ENCOUNTER — Ambulatory Visit: Payer: 59 | Admitting: Family Medicine

## 2019-10-31 ENCOUNTER — Other Ambulatory Visit: Payer: Self-pay | Admitting: Family Medicine

## 2019-11-10 ENCOUNTER — Encounter: Payer: Self-pay | Admitting: Family Medicine

## 2019-11-10 ENCOUNTER — Ambulatory Visit: Payer: 59 | Admitting: Family Medicine

## 2019-11-10 ENCOUNTER — Other Ambulatory Visit: Payer: Self-pay

## 2019-11-10 VITALS — BP 130/86 | Temp 97.0°F | Ht 69.0 in | Wt 270.2 lb

## 2019-11-10 DIAGNOSIS — M5431 Sciatica, right side: Secondary | ICD-10-CM | POA: Diagnosis not present

## 2019-11-10 DIAGNOSIS — I10 Essential (primary) hypertension: Secondary | ICD-10-CM

## 2019-11-10 MED ORDER — HYDROCODONE-ACETAMINOPHEN 10-325 MG PO TABS
1.0000 | ORAL_TABLET | Freq: Four times a day (QID) | ORAL | 0 refills | Status: DC | PRN
Start: 2019-11-10 — End: 2020-02-23

## 2019-11-10 MED ORDER — PREDNISONE 20 MG PO TABS
ORAL_TABLET | ORAL | 0 refills | Status: DC
Start: 2019-11-10 — End: 2020-01-04

## 2019-11-10 NOTE — Progress Notes (Signed)
   Subjective:    Patient ID: Carol Sharp, female    DOB: [birth year], 44 y.o.   MRN: [id number]  Hypertension This is a chronic problem. The current episode started more than 1 year ago. Pertinent negatives include no chest pain, headaches or shortness of breath. Treatments tried: triam/hctz. There are no compliance problems.    Patient states she is still having problems with her back Patient with ongoing low back pain been present for the past 4 weeks pain goes down the right leg.  Hurts with certain movements.  Denies any injury.  Denies any nausea vomiting abdominal pain but does relate some tingling in the leg as well as some intermittent weakness states pain level is severe  Review of Systems  Constitutional: Negative for activity change, fatigue and fever.  HENT: Negative for congestion and rhinorrhea.   Respiratory: Negative for cough, chest tightness and shortness of breath.   Cardiovascular: Negative for chest pain and leg swelling.  Gastrointestinal: Negative for abdominal pain and nausea.  Musculoskeletal: Positive for back pain. Negative for arthralgias.  Skin: Negative for color change.  Neurological: Negative for dizziness and headaches.  Psychiatric/Behavioral: Negative for agitation and behavioral problems.       Objective:   Physical Exam Vitals reviewed.  Constitutional:      General: She is not in acute distress. HENT:     Head: Normocephalic.  Cardiovascular:     Rate and Rhythm: Normal rate and regular rhythm.     Heart sounds: Normal heart sounds. No murmur heard.   Pulmonary:     Effort: Pulmonary effort is normal.     Breath sounds: Normal breath sounds.  Lymphadenopathy:     Cervical: No cervical adenopathy.  Neurological:     Mental Status: She is alert.  Psychiatric:        Behavior: Behavior normal.    Positive straight leg raise on the right no weakness detected on exam       Assessment & Plan:  Right side sciatica Pain medication  refill Also went ahead with prednisone taper Stretching exercises.  Follow-up in 4 to 6 weeks if not improved by then consider MRI  Patient under a lot of stress her husband faces felony charges coming up which is stressing her patient blood pressure is elevated on several readings today she would like to try to work hard on diet walking exercise minimizing salt and recheck this again in 4 to 6 weeks

## 2019-11-10 NOTE — Patient Instructions (Signed)
DASH Eating Plan DASH stands for "Dietary Approaches to Stop Hypertension." The DASH eating plan is a healthy eating plan that has been shown to reduce high blood pressure (hypertension). It may also reduce your risk for type 2 diabetes, heart disease, and stroke. The DASH eating plan may also help with weight loss. What are tips for following this plan?  General guidelines  Avoid eating more than 2,300 mg (milligrams) of salt (sodium) a day. If you have hypertension, you may need to reduce your sodium intake to 1,500 mg a day.  Limit alcohol intake to no more than 1 drink a day for nonpregnant women and 2 drinks a day for men. One drink equals 12 oz of beer, 5 oz of wine, or 1 oz of hard liquor.  Work with your health care provider to maintain a healthy body weight or to lose weight. Ask what an ideal weight is for you.  Get at least 30 minutes of exercise that causes your heart to beat faster (aerobic exercise) most days of the week. Activities may include walking, swimming, or biking.  Work with your health care provider or diet and nutrition specialist (dietitian) to adjust your eating plan to your individual calorie needs. Reading food labels   Check food labels for the amount of sodium per serving. Choose foods with less than 5 percent of the Daily Value of sodium. Generally, foods with less than 300 mg of sodium per serving fit into this eating plan.  To find whole grains, look for the word "whole" as the first word in the ingredient list. Shopping  Buy products labeled as "low-sodium" or "no salt added."  Buy fresh foods. Avoid canned foods and premade or frozen meals. Cooking  Avoid adding salt when cooking. Use salt-free seasonings or herbs instead of table salt or sea salt. Check with your health care provider or pharmacist before using salt substitutes.  Do not fry foods. Cook foods using healthy methods such as baking, boiling, grilling, and broiling instead.  Cook with  heart-healthy oils, such as olive, canola, soybean, or sunflower oil. Meal planning  Eat a balanced diet that includes: ? 5 or more servings of fruits and vegetables each day. At each meal, try to fill half of your plate with fruits and vegetables. ? Up to 6-8 servings of whole grains each day. ? Less than 6 oz of lean meat, poultry, or fish each day. A 3-oz serving of meat is about the same size as a deck of cards. One egg equals 1 oz. ? 2 servings of low-fat dairy each day. ? A serving of nuts, seeds, or beans 5 times each week. ? Heart-healthy fats. Healthy fats called Omega-3 fatty acids are found in foods such as flaxseeds and coldwater fish, like sardines, salmon, and mackerel.  Limit how much you eat of the following: ? Canned or prepackaged foods. ? Food that is high in trans fat, such as fried foods. ? Food that is high in saturated fat, such as fatty meat. ? Sweets, desserts, sugary drinks, and other foods with added sugar. ? Full-fat dairy products.  Do not salt foods before eating.  Try to eat at least 2 vegetarian meals each week.  Eat more home-cooked food and less restaurant, buffet, and fast food.  When eating at a restaurant, ask that your food be prepared with less salt or no salt, if possible. What foods are recommended? The items listed may not be a complete list. Talk with your dietitian about   what dietary choices are best for you. Grains Whole-grain or whole-wheat bread. Whole-grain or whole-wheat pasta. Brown rice. Oatmeal. Quinoa. Bulgur. Whole-grain and low-sodium cereals. Pita bread. Low-fat, low-sodium crackers. Whole-wheat flour tortillas. Vegetables Fresh or frozen vegetables (raw, steamed, roasted, or grilled). Low-sodium or reduced-sodium tomato and vegetable juice. Low-sodium or reduced-sodium tomato sauce and tomato paste. Low-sodium or reduced-sodium canned vegetables. Fruits All fresh, dried, or frozen fruit. Canned fruit in natural juice (without  added sugar). Meat and other protein foods Skinless chicken or turkey. Ground chicken or turkey. Pork with fat trimmed off. Fish and seafood. Egg whites. Dried beans, peas, or lentils. Unsalted nuts, nut butters, and seeds. Unsalted canned beans. Lean cuts of beef with fat trimmed off. Low-sodium, lean deli meat. Dairy Low-fat (1%) or fat-free (skim) milk. Fat-free, low-fat, or reduced-fat cheeses. Nonfat, low-sodium ricotta or cottage cheese. Low-fat or nonfat yogurt. Low-fat, low-sodium cheese. Fats and oils Soft margarine without trans fats. Vegetable oil. Low-fat, reduced-fat, or light mayonnaise and salad dressings (reduced-sodium). Canola, safflower, olive, soybean, and sunflower oils. Avocado. Seasoning and other foods Herbs. Spices. Seasoning mixes without salt. Unsalted popcorn and pretzels. Fat-free sweets. What foods are not recommended? The items listed may not be a complete list. Talk with your dietitian about what dietary choices are best for you. Grains Baked goods made with fat, such as croissants, muffins, or some breads. Dry pasta or rice meal packs. Vegetables Creamed or fried vegetables. Vegetables in a cheese sauce. Regular canned vegetables (not low-sodium or reduced-sodium). Regular canned tomato sauce and paste (not low-sodium or reduced-sodium). Regular tomato and vegetable juice (not low-sodium or reduced-sodium). Pickles. Olives. Fruits Canned fruit in a light or heavy syrup. Fried fruit. Fruit in cream or butter sauce. Meat and other protein foods Fatty cuts of meat. Ribs. Fried meat. Bacon. Sausage. Bologna and other processed lunch meats. Salami. Fatback. Hotdogs. Bratwurst. Salted nuts and seeds. Canned beans with added salt. Canned or smoked fish. Whole eggs or egg yolks. Chicken or turkey with skin. Dairy Whole or 2% milk, cream, and half-and-half. Whole or full-fat cream cheese. Whole-fat or sweetened yogurt. Full-fat cheese. Nondairy creamers. Whipped toppings.  Processed cheese and cheese spreads. Fats and oils Butter. Stick margarine. Lard. Shortening. Ghee. Bacon fat. Tropical oils, such as coconut, palm kernel, or palm oil. Seasoning and other foods Salted popcorn and pretzels. Onion salt, garlic salt, seasoned salt, table salt, and sea salt. Worcestershire sauce. Tartar sauce. Barbecue sauce. Teriyaki sauce. Soy sauce, including reduced-sodium. Steak sauce. Canned and packaged gravies. Fish sauce. Oyster sauce. Cocktail sauce. Horseradish that you find on the shelf. Ketchup. Mustard. Meat flavorings and tenderizers. Bouillon cubes. Hot sauce and Tabasco sauce. Premade or packaged marinades. Premade or packaged taco seasonings. Relishes. Regular salad dressings. Where to find more information:  National Heart, Lung, and Blood Institute: www.nhlbi.nih.gov  American Heart Association: www.heart.org Summary  The DASH eating plan is a healthy eating plan that has been shown to reduce high blood pressure (hypertension). It may also reduce your risk for type 2 diabetes, heart disease, and stroke.  With the DASH eating plan, you should limit salt (sodium) intake to 2,300 mg a day. If you have hypertension, you may need to reduce your sodium intake to 1,500 mg a day.  When on the DASH eating plan, aim to eat more fresh fruits and vegetables, whole grains, lean proteins, low-fat dairy, and heart-healthy fats.  Work with your health care provider or diet and nutrition specialist (dietitian) to adjust your eating plan to your   individual calorie needs. This information is not intended to replace advice given to you by your health care provider. Make sure you discuss any questions you have with your health care provider. Document Revised: 01/18/2017 Document Reviewed: 01/30/2016 Elsevier Patient Education  2020 Elsevier Inc.  

## 2019-11-17 ENCOUNTER — Other Ambulatory Visit: Payer: Self-pay | Admitting: Family Medicine

## 2019-11-19 ENCOUNTER — Telehealth: Payer: Self-pay

## 2019-11-19 MED ORDER — FLUCONAZOLE 150 MG PO TABS: ORAL_TABLET | ORAL | 2 refills | Status: DC

## 2019-11-19 NOTE — Telephone Encounter (Signed)
Patient notified

## 2019-11-19 NOTE — Telephone Encounter (Signed)
Patient is requesting refill on fluconazole 150 mg and please write for 2 pills a week instead of one its cheaper because she is spending $15 for just one pill. Last filled on 11/02/19 and last seen 11/10/19. [employer]

## 2019-11-19 NOTE — Telephone Encounter (Signed)
Prescription sent electronically to pharmacy. Left message to return call to notify patient. 

## 2019-11-19 NOTE — Telephone Encounter (Signed)
Diflucan 150 mg, #2, take 1 now and repeat again in 1 week, 2 refills

## 2019-11-30 ENCOUNTER — Other Ambulatory Visit: Payer: Self-pay | Admitting: *Deleted

## 2019-11-30 ENCOUNTER — Other Ambulatory Visit: Payer: 59

## 2019-11-30 DIAGNOSIS — Z20822 Contact with and (suspected) exposure to covid-19: Secondary | ICD-10-CM

## 2019-12-01 LAB — SPECIMEN STATUS REPORT

## 2019-12-01 LAB — NOVEL CORONAVIRUS, NAA: SARS-CoV-2, NAA: NOT DETECTED

## 2019-12-01 LAB — SARS-COV-2, NAA 2 DAY TAT

## 2019-12-14 ENCOUNTER — Other Ambulatory Visit: Payer: Self-pay | Admitting: Family Medicine

## 2019-12-15 ENCOUNTER — Ambulatory Visit: Payer: 59 | Admitting: Family Medicine

## 2020-01-04 ENCOUNTER — Other Ambulatory Visit: Payer: Self-pay

## 2020-01-04 ENCOUNTER — Encounter: Payer: Self-pay | Admitting: Family Medicine

## 2020-01-04 ENCOUNTER — Ambulatory Visit: Payer: 59 | Admitting: Family Medicine

## 2020-01-04 VITALS — BP 130/88 | Temp 97.2°F | Ht 69.0 in | Wt 269.0 lb

## 2020-01-04 DIAGNOSIS — E785 Hyperlipidemia, unspecified: Secondary | ICD-10-CM

## 2020-01-04 DIAGNOSIS — Z79899 Other long term (current) drug therapy: Secondary | ICD-10-CM | POA: Diagnosis not present

## 2020-01-04 DIAGNOSIS — R5383 Other fatigue: Secondary | ICD-10-CM | POA: Diagnosis not present

## 2020-01-04 DIAGNOSIS — F321 Major depressive disorder, single episode, moderate: Secondary | ICD-10-CM

## 2020-01-04 DIAGNOSIS — I10 Essential (primary) hypertension: Secondary | ICD-10-CM

## 2020-01-04 MED ORDER — SERTRALINE HCL 100 MG PO TABS
100.0000 mg | ORAL_TABLET | Freq: Every day | ORAL | 5 refills | Status: DC
Start: 2020-01-04 — End: 2020-07-20

## 2020-01-04 MED ORDER — PHENTERMINE HCL 37.5 MG PO CAPS
ORAL_CAPSULE | ORAL | 3 refills | Status: DC
Start: 2020-01-04 — End: 2021-01-03

## 2020-01-04 MED ORDER — ALPRAZOLAM 1 MG PO TABS
1.0000 mg | ORAL_TABLET | Freq: Every evening | ORAL | 3 refills | Status: DC | PRN
Start: 2020-01-04 — End: 2020-05-31

## 2020-01-04 MED ORDER — TRIAMTERENE-HCTZ 75-50 MG PO TABS: 1.0000 | ORAL_TABLET | Freq: Every day | ORAL | 5 refills | Status: DC

## 2020-01-04 MED ORDER — MELOXICAM 15 MG PO TABS
15.0000 mg | ORAL_TABLET | Freq: Every day | ORAL | 5 refills | Status: DC
Start: 2020-01-04 — End: 2020-07-20

## 2020-01-04 NOTE — Progress Notes (Signed)
   Subjective:    Patient ID: Carol Sharp, female    DOB: 15-May-1975, 44 y.o.   MRN: [id number]  HPImed check up.  Pt states she would like to restart phentermine.  Patient suffers with morbid obesity she does try to watch her portions stay active watch the fluids that she drinks she finds herself sometimes overeating when she takes phenteramine it helps.  She has been under a lot of stress recently because her husband is in jail and she finds her self feeling anxious and nervous  She does take her sertraline on a regular basis we did discuss about the possibility of counseling she is hopeful that she will improve over the next few weeks when he comes home from prison  Patient does have a hard time sleeping at night and uses Xanax to help her with sleep  Her blood pressure has been borderline recently we talked about the importance of watching diet watching portions exercise and losing weight   Review of Systems  Constitutional: Negative for activity change, appetite change and fatigue.  HENT: Negative for congestion and rhinorrhea.   Respiratory: Negative for cough and shortness of breath.   Cardiovascular: Negative for chest pain and leg swelling.  Gastrointestinal: Negative for abdominal pain and diarrhea.  Endocrine: Negative for polydipsia and polyphagia.  Skin: Negative for color change.  Neurological: Negative for dizziness and weakness.  Psychiatric/Behavioral: Negative for behavioral problems and confusion.       Objective:   Physical Exam Vitals reviewed.  Constitutional:      Appearance: She is well-developed.  HENT:     Head: Normocephalic.  Cardiovascular:     Rate and Rhythm: Normal rate and regular rhythm.     Heart sounds: Normal heart sounds. No murmur heard.   Pulmonary:     Effort: Pulmonary effort is normal.     Breath sounds: Normal breath sounds.  Skin:    General: Skin is warm and dry.  Neurological:     Mental Status: She is alert.            Assessment & Plan:  1. Hyperlipidemia, unspecified hyperlipidemia type Watch fats in diet stay active check lipid - Lipid panel  2. High risk medication use Has high blood pressure may continue medication bumping up the dose of diuretic recheck kidney function in 2 weeks - Hepatic function panel - Basic metabolic panel  3. Other fatigue Significant fatigue tiredness probably related to stress she is going to pick up exercise and do better with that and diet - Hepatic function panel - Basic metabolic panel - TSH  4. Primary hypertension Blood pressure bump up the dose of the diuretic recheck it again in approximately 4 to 6 weeks follow-up sooner problems  5. Morbid obesity ([place]) Watch portions stay active May use phenteramine maximum of 3 months watch diet closely  Moderate depression not suicidal sertraline should help but if she is not showing significant improvement over the next couple weeks notify us we will set up counseling

## 2020-02-04 ENCOUNTER — Other Ambulatory Visit: Payer: 59

## 2020-02-04 DIAGNOSIS — Z20822 Contact with and (suspected) exposure to covid-19: Secondary | ICD-10-CM

## 2020-02-06 LAB — NOVEL CORONAVIRUS, NAA: SARS-CoV-2, NAA: NOT DETECTED

## 2020-02-06 LAB — SARS-COV-2, NAA 2 DAY TAT

## 2020-02-19 ENCOUNTER — Ambulatory Visit: Payer: Self-pay

## 2020-02-19 ENCOUNTER — Encounter: Payer: Self-pay | Admitting: Family Medicine

## 2020-02-22 ENCOUNTER — Telehealth (INDEPENDENT_AMBULATORY_CARE_PROVIDER_SITE_OTHER): Payer: 59 | Admitting: Family Medicine

## 2020-02-22 ENCOUNTER — Telehealth: Payer: Self-pay | Admitting: Family Medicine

## 2020-02-22 ENCOUNTER — Other Ambulatory Visit: Payer: Self-pay

## 2020-02-22 DIAGNOSIS — R059 Cough, unspecified: Secondary | ICD-10-CM | POA: Diagnosis not present

## 2020-02-22 MED ORDER — AZITHROMYCIN 250 MG PO TABS
ORAL_TABLET | ORAL | 0 refills | Status: DC
Start: 2020-02-22 — End: 2020-03-16

## 2020-02-22 MED ORDER — HYDROCODONE-HOMATROPINE 5-1.5 MG/5ML PO SYRP: 5.0000 mL | ORAL_SOLUTION | ORAL | 0 refills | Status: AC | PRN

## 2020-02-22 NOTE — Telephone Encounter (Signed)
Ms. exa, bomba are scheduled for a virtual visit with your provider today.    Just as we do with appointments in the office, we must obtain your consent to participate.  Your consent will be active for this visit and any virtual visit you may have with one of our providers in the next 365 days.    If you have a MyChart account, I can also send a copy of this consent to you electronically.  All virtual visits are billed to your insurance company just like a traditional visit in the office.  As this is a virtual visit, video technology does not allow for your provider to perform a traditional examination.  This may limit your provider's ability to fully assess your condition.  If your provider identifies any concerns that need to be evaluated in person or the need to arrange testing such as labs, EKG, etc, we will make arrangements to do so.    Although advances in technology are sophisticated, we cannot ensure that it will always work on either your end or our end.  If the connection with a video visit is poor, we may have to switch to a telephone visit.  With either a video or telephone visit, we are not always able to ensure that we have a secure connection.   I need to obtain your verbal consent now.   Are you willing to proceed with your visit today?   RYLAN KAUFMANN has provided verbal consent on 02/22/2020 for a virtual visit (video or telephone).   Marlowe [place], LPN 03/26/2710  9:29 PM

## 2020-02-22 NOTE — Progress Notes (Signed)
   Subjective:    Patient ID: Carol Sharp, female    DOB: 1976/02/12, 45 y.o.   MRN: [id number]  HPI no and there  Patient presents today with respiratory illness Number of days present- Friday (3 days)  Symptoms include- cough, loss of voice, headache, sore throat and ear pain  Presence of worrisome signs (severe shortness of breath, lethargy, etc.) - none  Recent/current visit to urgent care or ER- called nurse triage on 02/19/20  Recent direct exposure to Covid- yes, daughter   Any current Covid testing- none at this time. Pt states she is unable to get appt to be tested.  Virtual Visit via Video Note  I connected with YANELIZ RADEBAUGH on 02/22/20 at  1:40 PM EST by a video enabled telemedicine application and verified that I am speaking with the correct person using two identifiers.  Location: Patient: home Provider: office   I discussed the limitations of evaluation and management by telemedicine and the availability of in person appointments. The patient expressed understanding and agreed to proceed.  History of Present Illness:    Observations/Objective:   Assessment and Plan:   Follow Up Instructions:    I discussed the assessment and treatment plan with the patient. The patient was provided an opportunity to ask questions and all were answered. The patient agreed with the plan and demonstrated an understanding of the instructions.   The patient was advised to call back or seek an in-person evaluation if the symptoms worsen or if the condition fails to improve as anticipated.  I provided 20 minutes of non-face-to-face time during this encounter.       Review of Systems     Objective:   Physical Exam Today's visit was via telephone Physical exam was not possible for this visit  Patient was brought in for nurse visit O2 saturation 94% Covid test taken.  Await the results.  If patient gets worse over the next couple days I would recommend in person  evaluation and chest x-ray       Assessment & Plan:  Respiratory illness Possible acute rhinosinusitis Laryngitis Covid test recommended Patient coming up to the office we will check O2 sat and Covid test Z-Pak as directed Hycodan teaspoon every 4 hours as needed cough caution drowsiness home use only over the next few days   Covid infection-suspected This is a viral process.  Mild cases are treated with supportive measures at home such as Tylenol rest fluids.  In some situations monoclonal antibodies may be appropriate depending on the patient's risk criteria.  The patient was educated regarding progressive illness including respiratory, persistent vomiting, change in mental status.  If any of these occur ER evaluation is recommended. Patient was educated about the following as well Covid-19 respiratory warning: Covid-19 is a virus that causes hypoxia (low oxygen level in blood) in some people. If you develop any changes in your usual breathing pattern: difficulty catching your breath, more short winded with activity or with resting, or anything that concerns you about your breathing, do not hesitate to go to the emergency department immediately for evaluation. Please do not delay to get treatment.   Agrees with plan of care discussed today. Understands warning signs to seek further care: Chest pain, shortness of breath, mental confusion, profuse vomiting, any significant change in health. Understands to follow-up if symptoms do not improve, or worsen.

## 2020-02-23 ENCOUNTER — Encounter: Payer: Self-pay | Admitting: Family Medicine

## 2020-02-23 ENCOUNTER — Other Ambulatory Visit: Payer: Self-pay | Admitting: *Deleted

## 2020-02-23 ENCOUNTER — Telehealth: Payer: Self-pay

## 2020-02-23 MED ORDER — FLUCONAZOLE 200 MG PO TABS
200.0000 mg | ORAL_TABLET | Freq: Every day | ORAL | 0 refills | Status: DC
Start: 2020-02-23 — End: 2020-03-16

## 2020-02-23 MED ORDER — HYDROCODONE-ACETAMINOPHEN 5-325 MG PO TABS: ORAL_TABLET | ORAL | 0 refills | Status: DC

## 2020-02-23 NOTE — Telephone Encounter (Signed)
Prescription was signed off on

## 2020-02-23 NOTE — Telephone Encounter (Signed)
Pt did want hydrocodone and med pended and ready to sign

## 2020-02-23 NOTE — Telephone Encounter (Signed)
Diflucan 200 mg 1 daily for 7 straight days

## 2020-02-23 NOTE — Telephone Encounter (Signed)
Called and discussed with pt. See pt call from today.

## 2020-02-23 NOTE — Telephone Encounter (Signed)
Nurses please see phone message 

## 2020-02-23 NOTE — Telephone Encounter (Signed)
Seen yesterday for respiratory illness

## 2020-02-23 NOTE — Progress Notes (Signed)
Called and discussed with pt and told her we would call to check on her tomorrow and if she did not hear from [place] to call us with an update.

## 2020-02-23 NOTE — Telephone Encounter (Signed)
Please explained to patient that Hycodan not available We can prescribe hydrocodone 5 mg (essentially Vicodin 5 mg) she could utilize this 1 every 6 hours as needed to help take the edge off coughing as well as help with the soreness in her ribs, it has the same amount of hydrocodone as the cough syrup, obviously we would not have to go a limited supply  If the patient is on board with this may have 20 tablets of Vicodin 5 mg / 325 mg 1 every 6 hours as needed caution drowsiness home use only

## 2020-02-23 NOTE — Telephone Encounter (Signed)
Pt called said there is a Therapist, art of the cough med HYDROcodone-homatropine (HYCODAN) 5-1.5 MG/5ML syrup [[id number]] and she needs something else sent to the pharmacy   Pt call back [phone]

## 2020-02-23 NOTE — Telephone Encounter (Signed)
Pt notified. Pt states she would like something for yeast infection because that antibiotic will give her a yeast infection and states she always needs diflucan for 7 days one daily for 7 days to get rid of it.

## 2020-02-23 NOTE — Telephone Encounter (Signed)
Med sent to pharm. Pt notified. On voicemail.  

## 2020-02-24 LAB — NOVEL CORONAVIRUS, NAA: SARS-CoV-2, NAA: NOT DETECTED

## 2020-02-24 LAB — SARS-COV-2, NAA 2 DAY TAT

## 2020-02-25 ENCOUNTER — Other Ambulatory Visit: Payer: 59

## 2020-02-25 NOTE — Progress Notes (Signed)
O2 93 yesterday and 94 this morning. Pt states she is feeling better. No fever. Eating and drinking well. States she is just having a cough.

## 2020-03-15 IMAGING — CT CT ABDOMEN AND PELVIS WITH CONTRAST
2 of 5 series · 16 of 46 positions shown, 18 images · IV contrast (Isovue)
Comparison: July 01, 2003

CLINICAL DATA: Left abdominal/left flank pain for approximately 10
days

EXAM:
CT ABDOMEN AND PELVIS WITH CONTRAST
TECHNIQUE: Multidetector CT imaging of the abdomen and pelvis was performed
using the standard protocol following bolus administration of
intravenous contrast.
CONTRAST:  100mL OMNIPAQUE IOHEXOL 300 MG/ML  SOLN

[Series 2: axial st · axial · 0.77mm/px · z∈[-845,-375]mm · 13 of 108 slices shown, 15 images]
[im 7/108  soft-tissue]
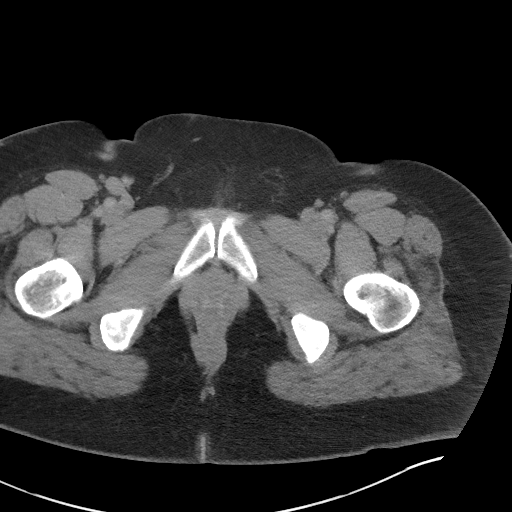
[im 7/108  bone]
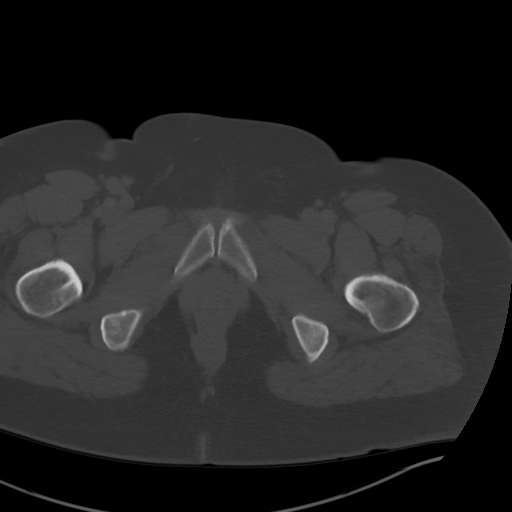
[im 13/108  soft-tissue]
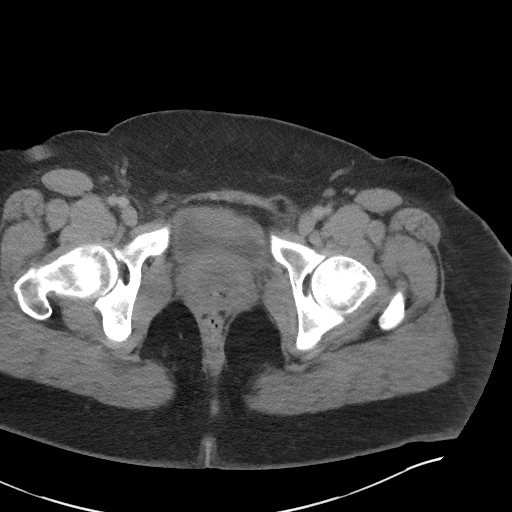
[im 26/108  soft-tissue]
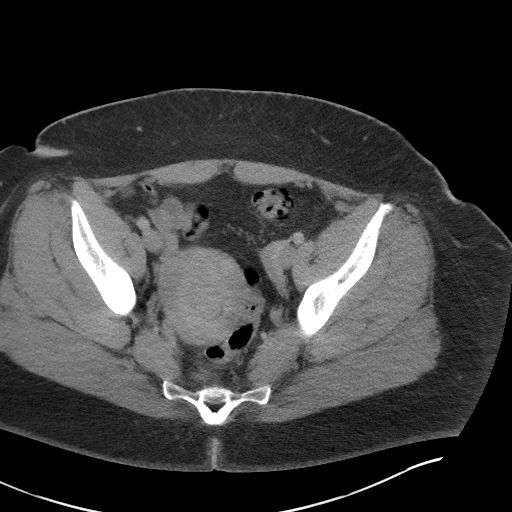
[im 32/108  soft-tissue]
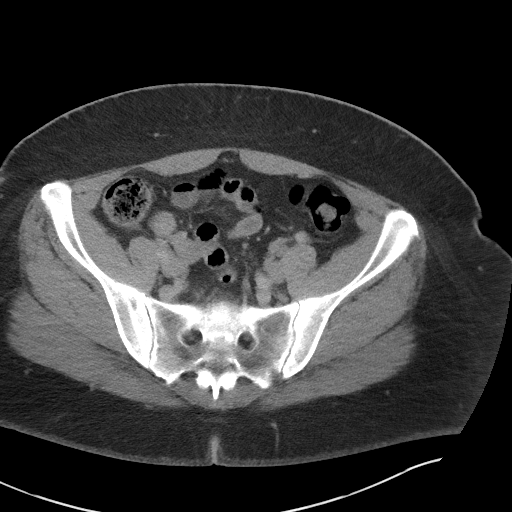
[im 38/108  soft-tissue]
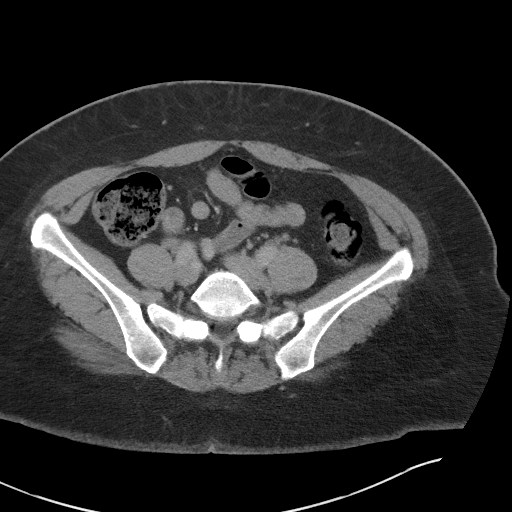
[im 45/108  soft-tissue]
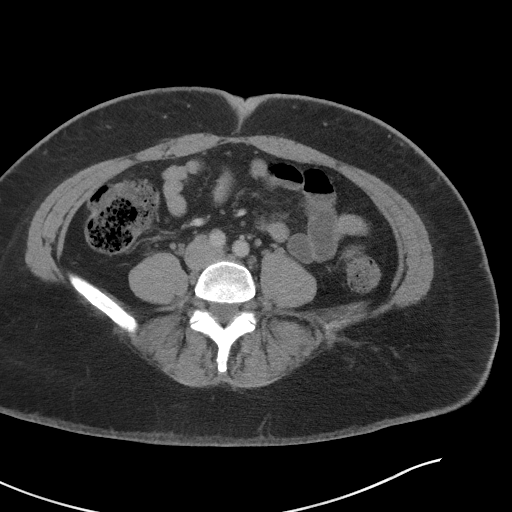
[im 57/108  soft-tissue]
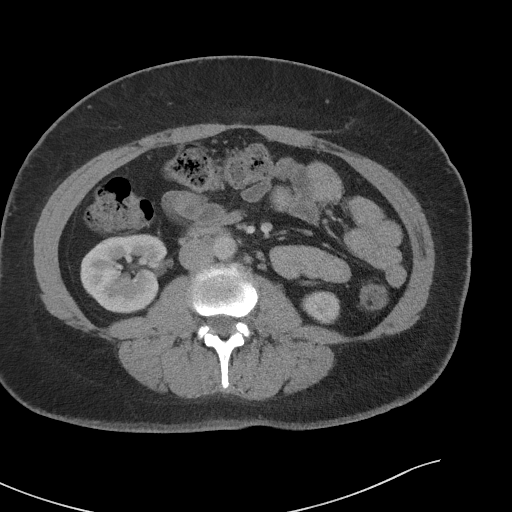
[im 63/108  soft-tissue]
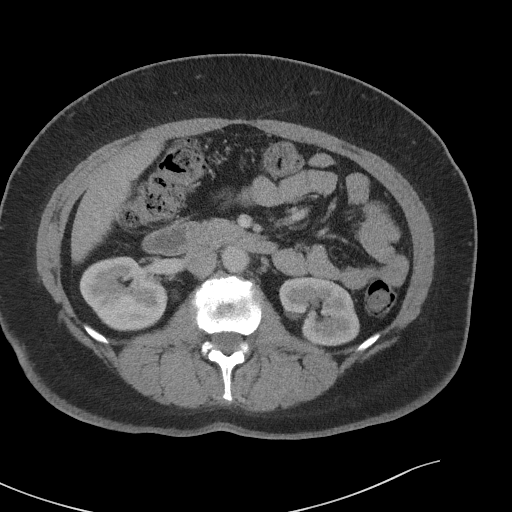
[im 70/108  soft-tissue]
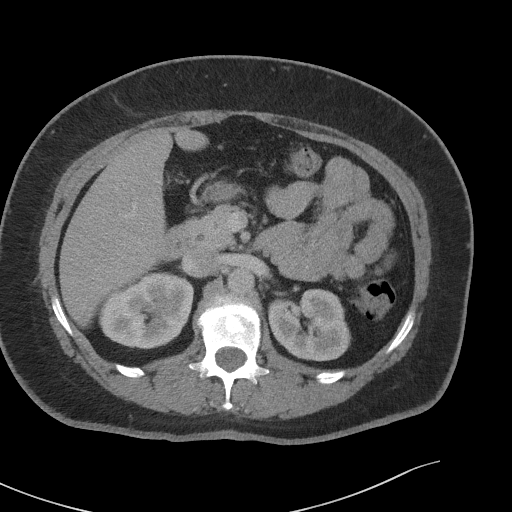
[im 70/108  bone]
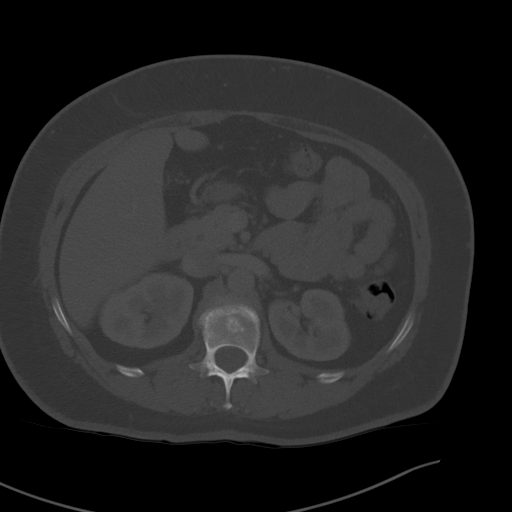
[im 76/108  soft-tissue]
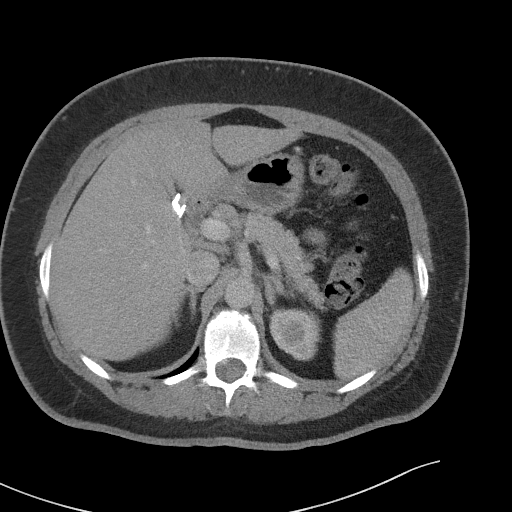
[im 82/108  soft-tissue]
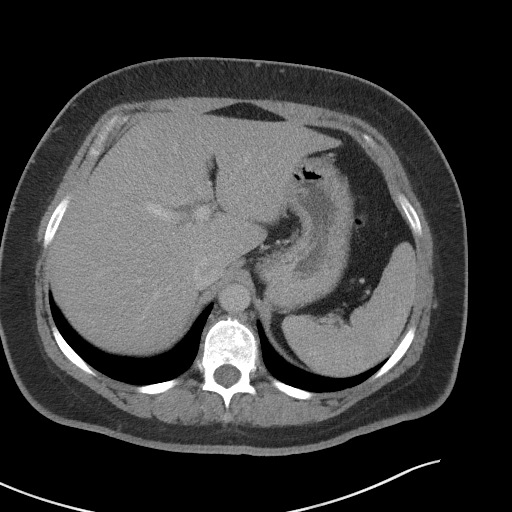
[im 95/108  soft-tissue]
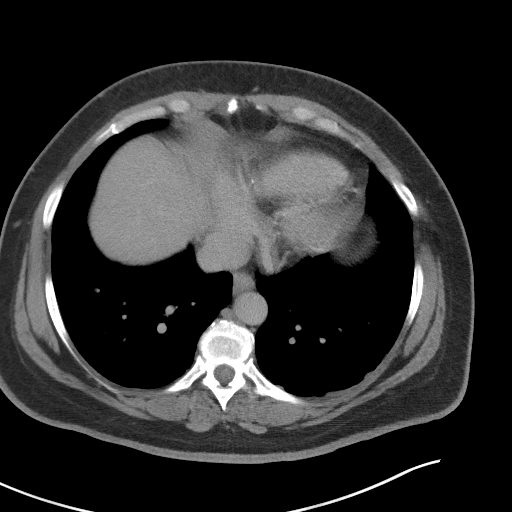
[im 101/108  soft-tissue]
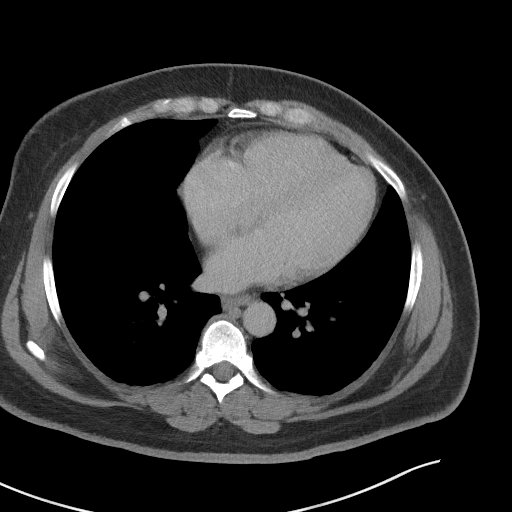

[Series 6: coronal st · coronal · 0.93mm/px · 3 of 117 slices shown]
[im 39/117  soft-tissue]
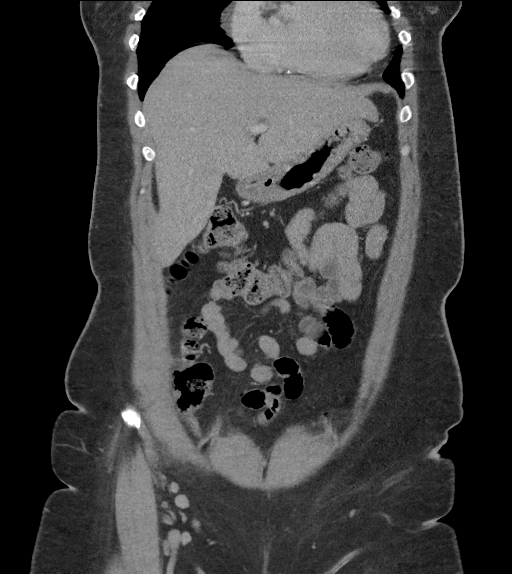
[im 52/117  soft-tissue]
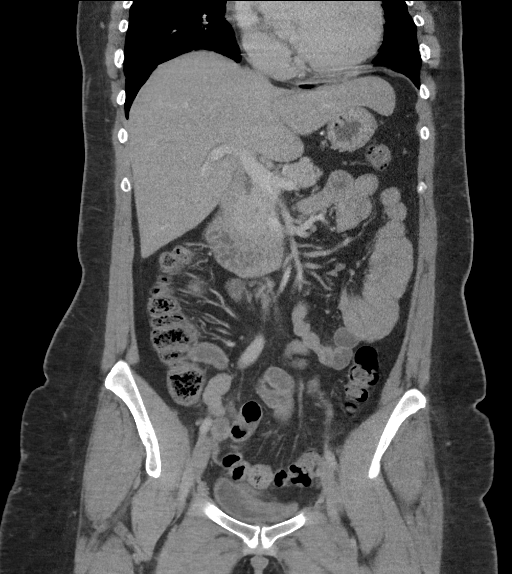
[im 65/117  soft-tissue]
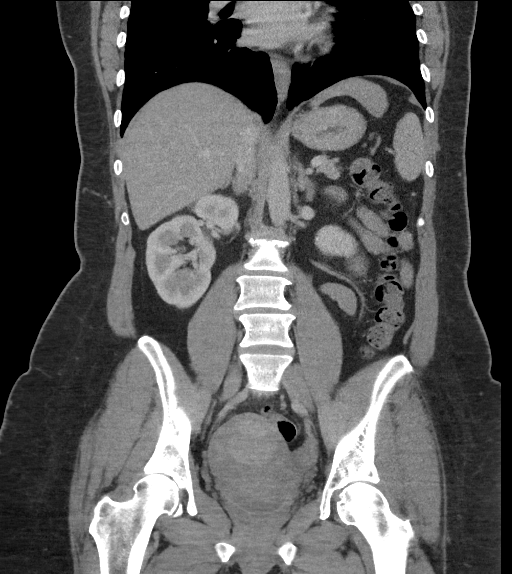

[16 of 46 positions shown; findings below may reference images not displayed]

FINDINGS: Lower chest: There is slight bibasilar atelectasis. No lung edema or
consolidation.

Hepatobiliary: No focal liver lesions are appreciable. Gallbladder
is absent. There is no appreciable biliary duct dilatation.

Pancreas: No pancreatic mass or inflammatory focus.

Spleen: No splenic lesions are evident.

Adrenals/Urinary Tract: Adrenals bilaterally appear unremarkable.
Kidneys bilaterally show no evident mass or hydronephrosis on either
side. There is a 2 mm calculus in the lower pole of the right
kidney. No other intrarenal calculi are evident. There is no
appreciable ureteral calculus on either side. Urinary bladder is
midline with wall thickness within normal limits.

Stomach/Bowel: There is no appreciable bowel wall or mesenteric
thickening. No evident bowel obstruction. No free air or portal
venous air.

Vascular/Lymphatic: There is no abdominal aortic aneurysm. No
vascular lesions are appreciable. There is no adenopathy in the
abdomen or pelvis.

Reproductive: The uterus is midline. The uterus is somewhat
inhomogeneous in attenuation, suggesting underlying leiomyomatous
change. No extrauterine pelvic mass evident.

Other: There is no periappendiceal region inflammation. No abscess
or ascites is appreciable in the abdomen or pelvis.

Musculoskeletal: There are no blastic or lytic bone lesions. There
are foci of degenerative change in the lower thoracic and lumbar
spine. There is no intramuscular or abdominal wall lesion.
IMPRESSION: 1. 2 mm nonobstructing calculus lower pole right kidney. No ureteral
calculus or hydronephrosis on either side.

2. No evident bowel obstruction. No abscess in the abdomen pelvis.
No periappendiceal region inflammation.

3. Question leiomyomatous change within the uterus. No extrauterine
pelvic mass.

4.  Gallbladder absent.

## 2020-03-16 ENCOUNTER — Ambulatory Visit (INDEPENDENT_AMBULATORY_CARE_PROVIDER_SITE_OTHER): Payer: 59 | Admitting: Family Medicine

## 2020-03-16 ENCOUNTER — Encounter: Payer: Self-pay | Admitting: Family Medicine

## 2020-03-16 ENCOUNTER — Other Ambulatory Visit: Payer: Self-pay

## 2020-03-16 VITALS — HR 78 | Temp 98.5°F | Resp 16

## 2020-03-16 DIAGNOSIS — R0789 Other chest pain: Secondary | ICD-10-CM

## 2020-03-16 DIAGNOSIS — R059 Cough, unspecified: Secondary | ICD-10-CM

## 2020-03-16 DIAGNOSIS — J40 Bronchitis, not specified as acute or chronic: Secondary | ICD-10-CM | POA: Diagnosis not present

## 2020-03-16 MED ORDER — PREDNISONE 20 MG PO TABS: ORAL_TABLET | ORAL | 0 refills | Status: DC

## 2020-03-16 MED ORDER — ALBUTEROL SULFATE HFA 108 (90 BASE) MCG/ACT IN AERS: 2.0000 | INHALATION_SPRAY | Freq: Four times a day (QID) | RESPIRATORY_TRACT | 0 refills | Status: DC | PRN

## 2020-03-16 NOTE — Progress Notes (Signed)
Patient ID: Carol Sharp, female    DOB: 27-Mar-1975, 45 y.o.   MRN: [id number]   No chief complaint on file.  Subjective:  CC: cough, congestion, body aches, chills  This is a chronic and a new problem.  Presents today via acute visit with a complaint of cough, congestion, runny nose which started in December.  On January 3 received a Z-Pak completed this did not help to resolve the issues.  Since Sunday is reporting chills, and body aches.  Has tried over-the-counter Mucinex.  Last Covid test on January 3 which was negative at that time.  Associated symptoms include fatigue, chest tightness, reports shortness of breath one time last night.  Worrisome symptoms include dry cough that will not stop.  Denies fever.   Patient presents today with respiratory illness Number of days present- started end of December   Symptoms include- cough, congestion, sweating, body aches.   Presence of worrisome signs (severe shortness of breath, lethargy, etc.) - a little sob yesterday and this morning  Recent/current visit to urgent care or ER-  Recent direct exposure to Covid- boss and daughter both had covid. Pt has been tested since then and it was negative  Any current Covid testing- 1/3 ( negative)   Medical History Carol Sharp has a past medical history of Anxiety, Arthritis, Breast nodule (08/02/2015), Depression, Hypertension, and MRSA cellulitis (May 2005).   Outpatient Encounter Medications as of 03/16/2020  Medication Sig  . albuterol (VENTOLIN HFA) 108 (90 Base) MCG/ACT inhaler Inhale 2 puffs into the lungs every 6 (six) hours as needed for wheezing or shortness of breath.  . ALPRAZolam (XANAX) 1 MG tablet Take 1 tablet (1 mg total) by mouth at bedtime as needed. for sleep  . meloxicam (MOBIC) 15 MG tablet Take 1 tablet (15 mg total) by mouth daily.  . predniSONE (DELTASONE) 20 MG tablet Take three tablets by mouth for 3 days, then two tablets by mouth for 3 days, then one tablet by mouth  for 3 days.  Marland Kitchen sertraline (ZOLOFT) 100 MG tablet Take 1 tablet (100 mg total) by mouth daily.  Marland Kitchen triamterene-hydrochlorothiazide (MAXZIDE) 75-50 MG tablet Take 1 tablet by mouth daily.  . valACYclovir (VALTREX) 1000 MG tablet Take 2 tablets po now then 2 tablet po 12 hours later  . phentermine 37.5 MG capsule Take 1 tablet every morning before meal. (Patient not taking: Reported on 03/16/2020)  . [DISCONTINUED] azithromycin (ZITHROMAX Z-PAK) 250 MG tablet Take 2 tablets (500 mg) on  Day 1,  followed by 1 tablet (250 mg) once daily on Days 2 through 5.  . [DISCONTINUED] fluconazole (DIFLUCAN) 200 MG tablet Take 1 tablet (200 mg total) by mouth daily.  . [DISCONTINUED] HYDROcodone-acetaminophen (NORCO/VICODIN) 5-325 MG tablet Take one tablet every 6 hours prn. Caution drowsiness   No facility-administered encounter medications on file as of 03/16/2020.     Review of Systems  Constitutional: Positive for chills and fatigue. Negative for fever.  HENT: Positive for rhinorrhea. Negative for ear pain, sinus pressure, sinus pain and sore throat.   Respiratory: Positive for cough, chest tightness and shortness of breath. Negative for wheezing.        Shortness of breath last night,   Cardiovascular: Positive for chest pain.       With coughing  Gastrointestinal: Positive for abdominal pain. Negative for diarrhea, nausea and vomiting.       Sunday/monday  Musculoskeletal: Positive for myalgias.  Neurological: Positive for headaches.     Vitals Pulse  78   Temp 98.5 F (36.9 C)   Resp 16   SpO2 98%   Objective:   Physical Exam Vitals reviewed.  Constitutional:      Appearance: Normal appearance.  Cardiovascular:     Rate and Rhythm: Normal rate and regular rhythm.     Heart sounds: Normal heart sounds.  Pulmonary:     Effort: Pulmonary effort is normal.     Breath sounds: Normal breath sounds.     Comments: Patient is a smoker, coughed throughout lung assessment, dry cough.  Lungs  clear. Skin:    General: Skin is warm and dry.  Neurological:     General: No focal deficit present.     Mental Status: She is alert.  Psychiatric:        Behavior: Behavior normal.      Assessment and Plan   1. Bronchitis - predniSONE (DELTASONE) 20 MG tablet; Take three tablets by mouth for 3 days, then two tablets by mouth for 3 days, then one tablet by mouth for 3 days.  Dispense: 18 tablet; Refill: 0  2. Chest tightness - albuterol (VENTOLIN HFA) 108 (90 Base) MCG/ACT inhaler; Inhale 2 puffs into the lungs every 6 (six) hours as needed for wheezing or shortness of breath.  Dispense: 8 g; Refill: 0  3. Cough - Novel Coronavirus, NAA ([facility])   Due to chest tightness and shortness of breath (no shortness of breath in office today), will treat for bronchitis with prednisone and albuterol inhaler.  Already completed antibiotic course in January without symptom resolution, will not repeat antibiotic today.  Recommend supportive therapy, adequate hydration, OTC cough medication that contains dextromethorphan for cough suppression at night.  Oxygen saturation today 98%, patient is a smoker, lungs clear.  Agrees with plan of care discussed today. Understands warning signs to seek further care: chest pain, shortness of breath, any significant change in health.  Understands to follow-up if symptoms do not improve, or worsen.  Work note given, will return to work Monday, March 21, 2020.   Carol Guest, NP 03/16/2020

## 2020-03-16 NOTE — Patient Instructions (Signed)
Buy over the counter cough medicine with dextromethorphan in it. Ask pharmacist to assist you if needed.     Acute Bronchitis, Adult  Acute bronchitis is when air tubes in the lungs (bronchi) suddenly get swollen. The condition can make it hard for you to breathe. In adults, acute bronchitis usually goes away within 2 weeks. A cough caused by bronchitis may last up to 3 weeks. Smoking, allergies, and asthma can make the condition worse. What are the causes? This condition is caused by:  Cold and flu viruses. The most common cause of this condition is the virus that causes the common cold.  Bacteria.  Substances that irritate the lungs, including: ? Smoke from cigarettes and other types of tobacco. ? Dust and pollen. ? Fumes from chemicals, gases, or burned fuel. ? Other materials that pollute indoor or outdoor air.  Close contact with someone who has acute bronchitis. What increases the risk? The following factors may make you more likely to develop this condition:  A weak body's defense system. This is also called the immune system.  Any condition that affects your lungs and breathing, such as asthma. What are the signs or symptoms? Symptoms of this condition include:  A cough.  Coughing up clear, yellow, or green mucus.  Wheezing.  Chest congestion.  Shortness of breath.  A fever.  Body aches.  Chills.  A sore throat. How is this treated? Acute bronchitis may go away over time without treatment. Your doctor may recommend:  Drinking more fluids.  Taking a medicine for a fever or cough.  Using a device that gets medicine into your lungs (inhaler).  Using a vaporizer or a humidifier. These are machines that add water or moisture in the air to help with coughing and poor breathing. Follow these instructions at home: Activity  Get a lot of rest.  Avoid places where there are fumes from chemicals.  Return to your normal activities as told by your doctor.  Ask your doctor what activities are safe for you. Lifestyle  Drink enough fluids to keep your pee (urine) pale yellow.  Do not drink alcohol.  Do not use any products that contain nicotine or tobacco, such as cigarettes, e-cigarettes, and chewing tobacco. If you need help quitting, ask your doctor. Be aware that: ? Your bronchitis will get worse if you smoke or breathe in other people's smoke (secondhand smoke). ? Your lungs will heal faster if you quit smoking. General instructions  Take over-the-counter and prescription medicines only as told by your doctor.  Use an inhaler, cool mist vaporizer, or humidifier as told by your doctor.  Rinse your mouth often with salt water. To make salt water, dissolve -1 tsp (3-6 g) of salt in 1 cup (237 mL) of warm water.  Keep all follow-up visits as told by your doctor. This is important.   How is this prevented? To lower your risk of getting this condition again:  Wash your hands often with soap and water. If soap and water are not available, use hand sanitizer.  Avoid contact with people who have cold symptoms.  Try not to touch your mouth, nose, or eyes with your hands.  Make sure to get the flu shot every year.   Contact a doctor if:  Your symptoms do not get better in 2 weeks.  You vomit more than once or twice.  You have symptoms of loss of fluid from your body (dehydration). These include: ? Dark urine. ? Dry skin or eyes. ?  Increased thirst. ? Headaches. ? Confusion. ? Muscle cramps. Get help right away if:  You cough up blood.  You have chest pain.  You have very bad shortness of breath.  You become dehydrated.  You faint or keep feeling like you are going to faint.  You keep vomiting.  You have a very bad headache.  Your fever or chills get worse. These symptoms may be an emergency. Do not wait to see if the symptoms will go away. Get medical help right away. Call your local emergency services (911 in the  [place] Do not drive yourself to the hospital. Summary  Acute bronchitis is when air tubes in the lungs (bronchi) suddenly get swollen. In adults, acute bronchitis usually goes away within 2 weeks.  Take over-the-counter and prescription medicines only as told by your doctor.  Drink enough fluid to keep your pee (urine) pale yellow.  Contact a doctor if your symptoms do not improve after 2 weeks of treatment.  Get help right away if you cough up blood, faint, or have chest pain or shortness of breath. This information is not intended to replace advice given to you by your health care provider. Make sure you discuss any questions you have with your health care provider. Document Revised: 08/29/2018 Document Reviewed: 08/29/2018 Elsevier Patient Education  [place].

## 2020-03-18 ENCOUNTER — Other Ambulatory Visit: Payer: Self-pay | Admitting: Family Medicine

## 2020-03-18 DIAGNOSIS — R059 Cough, unspecified: Secondary | ICD-10-CM

## 2020-03-18 DIAGNOSIS — U071 COVID-19: Secondary | ICD-10-CM | POA: Insufficient documentation

## 2020-03-18 LAB — SARS-COV-2, NAA 2 DAY TAT

## 2020-03-18 LAB — NOVEL CORONAVIRUS, NAA: SARS-CoV-2, NAA: DETECTED — AB

## 2020-03-18 MED ORDER — GUAIFENESIN-CODEINE 100-10 MG/5ML PO SOLN: 5.0000 mL | Freq: Three times a day (TID) | ORAL | 0 refills | Status: DC | PRN

## 2020-03-18 NOTE — Progress Notes (Signed)
Severe cough with Covid infection. Requesting stronger cough med.

## 2020-03-19 ENCOUNTER — Telehealth: Payer: Self-pay | Admitting: Infectious Diseases

## 2020-03-19 NOTE — Telephone Encounter (Signed)
Called to discuss with patient about COVID-19 symptoms and the use of one of the available treatments for those with mild to moderate Covid symptoms and at a high risk of hospitalization.  Pt appears to qualify for outpatient treatment due to co-morbid conditions and/or a member of an at-risk group in accordance with the FDA Emergency Use Authorization.    Symptom onset: documented 1/23  Vaccinated: undocumented  Booster? Undocumented  Immunocompromised? no Qualifiers: obese, HTN  Unable to reach pt - lvm and sent MyChart to discuss if she qualifies for MAB.     Carol Sharp

## 2020-03-20 ENCOUNTER — Telehealth: Payer: Self-pay | Admitting: Adult Health

## 2020-03-20 NOTE — Telephone Encounter (Signed)
I connected by phone with Carol Sharp on 03/20/2020 at 3:48 PM to discuss the potential use of a new treatment for mild to moderate COVID-19 viral infection in non-hospitalized patients.  This patient is a 45 y.o. female that meets the FDA criteria for Emergency Use Authorization of COVID monoclonal antibody sotrovimab.  Has a (+) direct SARS-CoV-2 viral test result  Has mild or moderate COVID-19   Is NOT hospitalized due to COVID-19  Is within 10 days of symptom onset  Has at least one of the high risk factor(s) for progression to severe COVID-19 and/or hospitalization as defined in EUA.  Specific high risk criteria : BMI > 25 and Cardiovascular disease or hypertension   I have spoken and communicated the following to the patient or parent/caregiver regarding COVID monoclonal antibody treatment:  1. FDA has authorized the emergency use for the treatment of mild to moderate COVID-19 in adults and pediatric patients with positive results of direct SARS-CoV-2 viral testing who are 72 years of age and older weighing at least 40 kg, and who are at high risk for progressing to severe COVID-19 and/or hospitalization.  2. The significant known and potential risks and benefits of COVID monoclonal antibody, and the extent to which such potential risks and benefits are unknown.  3. Information on available alternative treatments and the risks and benefits of those alternatives, including clinical trials.  4. Patients treated with COVID monoclonal antibody should continue to self-isolate and use infection control measures (e.g., wear mask, isolate, social distance, avoid sharing personal items, clean and disinfect "high touch" surfaces, and frequent handwashing) according to CDC guidelines.   5. The patient or parent/caregiver has the option to accept or refuse COVID monoclonal antibody treatment.  After reviewing this information with the patient, the patient has DECLINED offer to receive the  infusion. Scot Dock, NP 03/20/2020 3:48 PM

## 2020-03-21 NOTE — Telephone Encounter (Signed)
Infusion center did try to get a hold of patient but was unable to do so therefore they sent a MyChart message Please connect with patient to see how she is feeling? What date did her symptoms first began? If she is seeing any improvement?

## 2020-03-22 NOTE — Telephone Encounter (Signed)
Patient states she declined the infusion because they informed he she would be responsible to pay the cost after insurance and with her deductible it would have been a lot. Patient says se still has some cough and is using otc medications and taking it one day at a time and will call us if any problems

## 2020-03-22 NOTE — Telephone Encounter (Signed)
Left message to return call 

## 2020-05-30 ENCOUNTER — Other Ambulatory Visit: Payer: Self-pay | Admitting: Family Medicine

## 2020-05-31 NOTE — Telephone Encounter (Signed)
May do Xanax with 2 additional refills follow-up by June, please refuse phenteramine thank you

## 2020-06-30 ENCOUNTER — Other Ambulatory Visit: Payer: Self-pay | Admitting: Family Medicine

## 2020-07-18 ENCOUNTER — Other Ambulatory Visit: Payer: Self-pay | Admitting: Family Medicine

## 2020-07-18 ENCOUNTER — Other Ambulatory Visit: Payer: Self-pay | Admitting: Nurse Practitioner

## 2020-07-19 NOTE — Telephone Encounter (Signed)
May have 3 months worth of prescriptions patient does need to do follow-up office visit

## 2020-07-20 ENCOUNTER — Other Ambulatory Visit: Payer: Self-pay | Admitting: Family Medicine

## 2020-07-21 NOTE — Telephone Encounter (Signed)
May have 56-month supply needs to do a follow-up office visit this summer

## 2020-07-30 ENCOUNTER — Other Ambulatory Visit: Payer: Self-pay | Admitting: Family Medicine

## 2020-08-01 NOTE — Telephone Encounter (Signed)
Please contact patient to have her set up appt; then may route back to nurses. Thank you

## 2020-08-01 NOTE — Telephone Encounter (Signed)
Sent my chart message to schedule appt

## 2020-08-03 ENCOUNTER — Encounter: Payer: Self-pay | Admitting: Family Medicine

## 2020-08-03 ENCOUNTER — Other Ambulatory Visit: Payer: Self-pay

## 2020-08-03 ENCOUNTER — Ambulatory Visit: Payer: 59 | Admitting: Family Medicine

## 2020-08-03 VITALS — BP 159/103 | HR 88 | Temp 97.1°F | Wt 274.4 lb

## 2020-08-03 DIAGNOSIS — I10 Essential (primary) hypertension: Secondary | ICD-10-CM | POA: Diagnosis not present

## 2020-08-03 DIAGNOSIS — E785 Hyperlipidemia, unspecified: Secondary | ICD-10-CM | POA: Diagnosis not present

## 2020-08-03 DIAGNOSIS — Z79899 Other long term (current) drug therapy: Secondary | ICD-10-CM

## 2020-08-03 MED ORDER — SERTRALINE HCL 100 MG PO TABS: 1.0000 | ORAL_TABLET | Freq: Every day | ORAL | 5 refills | Status: DC

## 2020-08-03 MED ORDER — ALPRAZOLAM 1 MG PO TABS: 1.0000 mg | ORAL_TABLET | Freq: Every evening | ORAL | 5 refills | Status: DC | PRN

## 2020-08-03 MED ORDER — CHLORTHALIDONE 50 MG PO TABS: 50.0000 mg | ORAL_TABLET | Freq: Every day | ORAL | 5 refills | Status: DC

## 2020-08-03 MED ORDER — VALSARTAN 80 MG PO TABS: 80.0000 mg | ORAL_TABLET | Freq: Every day | ORAL | 3 refills | Status: DC

## 2020-08-03 NOTE — Telephone Encounter (Signed)
Had appointment 6/15 for medication follow up

## 2020-08-03 NOTE — Progress Notes (Signed)
   Subjective:    Patient ID: Carol Sharp, female    DOB: 05-20-1975, 45 y.o.   MRN: [id number]  HPI Pt here for follow up on HTN. Pt states blood pressure doing well but unable to check outside of outside.  Pt needing refills on all meds except Maxzide 75-50 mg. Pt prefers 90 day.  Patient's blood pressure has been elevated at times.  She is going to a weight clinic that has around phenteramine.  Phenteramine can cause weight to increase.  She states she has not taken them phenteramine the past several days.  Denies any headaches.  She states her moods overall are doing fairly well.  She does have to take meloxicam because orthopedic pain in her back and knees and hips and without it she states she will be able to move Review of Systems     Objective:   Physical Exam  General-in no acute distress Eyes-no discharge Lungs-respiratory rate normal, CTA CV-no murmurs,RRR Extremities skin warm dry some lower leg edema near the ankles and feet not severe not pitting Neuro grossly normal Behavior normal, alert       Assessment & Plan:  1. Hyperlipidemia, unspecified hyperlipidemia type Check lipid profile.  Watch diet closely. - Basic Metabolic Panel (BMET) - Lipid Profile  2. High risk medication use Check kidney function await the results - Basic Metabolic Panel (BMET) - Lipid Profile  3. Primary hypertension Blood pressure elevated Switch up medicines Stop Dyazide Chlorthalidone 50 mg each morning Valsartan 80 mg each day Follow-up in several weeks to recheck blood pressure Check blood pressure at home intermittently If blood pressure stays elevated patient should stay away from phenteramine  Stress levels are stable she finds herself doing well on meloxicam for her orthopedic discomfort she wants to continue it she is aware it can contribute to her elevated blood pressure  With stress levels going well she states the Zoloft is helping her we will continue that  measure.  Morbid obesity portion control regular physical activity discussed patient is trying to lose weight  Recheck blood pressure 30 days  - Basic Metabolic Panel (BMET) - Lipid Profile

## 2020-08-05 ENCOUNTER — Encounter: Payer: Self-pay | Admitting: Family Medicine

## 2020-08-05 ENCOUNTER — Other Ambulatory Visit: Payer: Self-pay | Admitting: *Deleted

## 2020-08-05 DIAGNOSIS — Z1211 Encounter for screening for malignant neoplasm of colon: Secondary | ICD-10-CM

## 2020-08-05 NOTE — Telephone Encounter (Signed)
Please go ahead with colonoscopy referral to gastroenterology for screening purposes-send patient MyChart message stating that this has been initiated thanks

## 2020-08-10 ENCOUNTER — Encounter: Payer: Self-pay | Admitting: Internal Medicine

## 2020-08-20 ENCOUNTER — Other Ambulatory Visit: Payer: Self-pay | Admitting: Family Medicine

## 2020-09-06 LAB — BASIC METABOLIC PANEL
BUN/Creatinine Ratio: 22 (ref 9–23)
BUN: 19 mg/dL (ref 6–24)
CO2: 21 mmol/L (ref 20–29)
Calcium: 9.5 mg/dL (ref 8.7–10.2)
Chloride: 103 mmol/L (ref 96–106)
Creatinine, Ser: 0.88 mg/dL (ref 0.57–1.00)
Glucose: 98 mg/dL (ref 65–99)
Potassium: 4.1 mmol/L (ref 3.5–5.2)
Sodium: 140 mmol/L (ref 134–144)
eGFR: 83 mL/min/{1.73_m2} (ref >59)

## 2020-09-06 LAB — LIPID PANEL
Chol/HDL Ratio: 4.6 ratio — ABNORMAL HIGH (ref 0.0–4.4)
Cholesterol, Total: 233 mg/dL — ABNORMAL HIGH (ref 100–199)
HDL: 51 mg/dL (ref >39)
LDL Chol Calc (NIH): 135 mg/dL — ABNORMAL HIGH (ref 0–99)
Triglycerides: 261 mg/dL — ABNORMAL HIGH (ref 0–149)
VLDL Cholesterol Cal: 47 mg/dL — ABNORMAL HIGH (ref 5–40)

## 2020-09-16 ENCOUNTER — Ambulatory Visit: Payer: 59 | Admitting: Family Medicine

## 2020-09-16 ENCOUNTER — Other Ambulatory Visit: Payer: Self-pay

## 2020-09-16 ENCOUNTER — Encounter: Payer: Self-pay | Admitting: Family Medicine

## 2020-09-16 VITALS — BP 134/88 | Temp 98.1°F | Wt 281.2 lb

## 2020-09-16 DIAGNOSIS — G4700 Insomnia, unspecified: Secondary | ICD-10-CM | POA: Diagnosis not present

## 2020-09-16 DIAGNOSIS — F411 Generalized anxiety disorder: Secondary | ICD-10-CM | POA: Diagnosis not present

## 2020-09-16 DIAGNOSIS — I10 Essential (primary) hypertension: Secondary | ICD-10-CM

## 2020-09-16 DIAGNOSIS — Z8619 Personal history of other infectious and parasitic diseases: Secondary | ICD-10-CM

## 2020-09-16 MED ORDER — VALACYCLOVIR HCL 1 G PO TABS: ORAL_TABLET | ORAL | 12 refills | Status: DC

## 2020-09-16 MED ORDER — FLUCONAZOLE 200 MG PO TABS: ORAL_TABLET | ORAL | 3 refills | Status: DC

## 2020-09-16 NOTE — Progress Notes (Signed)
   Subjective:    Patient ID: Carol Sharp, female    DOB: Nov 06, 1975, 45 y.o.   MRN: [id number]  HPI Pt here for follow up on blood pressure and medications.  Patient doing a good job taking her medications.  Try to watch her diet trying to lose some weight.  Denies high fever chills sweats.  Moods overall are doing fairly well.  Pt would like refill on all meds; possible increase quantity of Diflucan and Valtrex.  She does get frequent cold sores. She also states she has frequent yeast infections for which she takes Diflucan.   Review of Systems     Objective:   Physical Exam General-in no acute distress Eyes-no discharge Lungs-respiratory rate normal, CTA CV-no murmurs,RRR Extremities skin warm dry no edema Neuro grossly normal Behavior normal, alert        Assessment & Plan:  1. H/O cold sores Valtrex daily  2. Benign essential hypertension Blood pressure good control watch salt in diet stay active continue medication  3. Morbid obesity ([place]) Portion control regular activity phenteramine through her weight loss clinic patient encouraged not to take that year-round Wygovy/Saxenda are options  4. Insomnia, unspecified type Xanax at nighttime to help her with rest Proper sleep hygiene discussed  5. GAD (generalized anxiety disorder) Sertraline ongoing helpful patient would like to continue  Recheck 6 months

## 2020-09-16 NOTE — Patient Instructions (Signed)
Continue to eat healthy follow-up in 6 months   [email or web address]">  DASH Eating Plan DASH stands for Dietary Approaches to Stop Hypertension. The DASH eating plan is a healthy eating plan that has been shown to: Reduce high blood pressure (hypertension). Reduce your risk for type 2 diabetes, heart disease, and stroke. Help with weight loss. What are tips for following this plan? Reading food labels Check food labels for the amount of salt (sodium) per serving. Choose foods with less than 5 percent of the Daily Value of sodium. Generally, foods with less than 300 milligrams (mg) of sodium per serving fit into this eating plan. To find whole grains, look for the word "whole" as the first word in the ingredient list. Shopping Buy products labeled as "low-sodium" or "no salt added." Buy fresh foods. Avoid canned foods and pre-made or frozen meals. Cooking Avoid adding salt when cooking. Use salt-free seasonings or herbs instead of table salt or sea salt. Check with your health care provider or pharmacist before using salt substitutes. Do not fry foods. Cook foods using healthy methods such as baking, boiling, grilling, roasting, and broiling instead. Cook with heart-healthy oils, such as olive, canola, avocado, soybean, or sunflower oil. Meal planning  Eat a balanced diet that includes: 4 or more servings of fruits and 4 or more servings of vegetables each day. Try to fill one-half of your plate with fruits and vegetables. 6-8 servings of whole grains each day. Less than 6 oz (170 g) of lean meat, poultry, or fish each day. A 3-oz (85-g) serving of meat is about the same size as a deck of cards. One egg equals 1 oz (28 g). 2-3 servings of low-fat dairy each day. One serving is 1 cup (237 mL). 1 serving of nuts, seeds, or beans 5 times each week. 2-3 servings of heart-healthy fats. Healthy fats called omega-3 fatty acids are found in foods such  as walnuts, flaxseeds, fortified milks, and eggs. These fats are also found in cold-water fish, such as sardines, salmon, and mackerel. Limit how much you eat of: Canned or prepackaged foods. Food that is high in trans fat, such as some fried foods. Food that is high in saturated fat, such as fatty meat. Desserts and other sweets, sugary drinks, and other foods with added sugar. Full-fat dairy products. Do not salt foods before eating. Do not eat more than 4 egg yolks a week. Try to eat at least 2 vegetarian meals a week. Eat more home-cooked food and less restaurant, buffet, and fast food.  Lifestyle When eating at a restaurant, ask that your food be prepared with less salt or no salt, if possible. If you drink alcohol: Limit how much you use to: 0-1 drink a day for women who are not pregnant. 0-2 drinks a day for men. Be aware of how much alcohol is in your drink. In the [place] one drink equals one 12 oz bottle of beer (355 mL), one 5 oz glass of wine (148 mL), or one 1 oz glass of hard liquor (44 mL). General information Avoid eating more than 2,300 mg of salt a day. If you have hypertension, you may need to reduce your sodium intake to 1,500 mg a day. Work with your health care provider to maintain a healthy body weight or to lose weight. Ask what an ideal weight is for you. Get at least 30 minutes of exercise that causes your heart to beat faster (aerobic exercise) most days of the week. Activities may include  walking, swimming, or biking. Work with your health care provider or dietitian to adjust your eating plan to your individual calorie needs. What foods should I eat? Fruits All fresh, dried, or frozen fruit. Canned fruit in natural juice (without addedsugar). Vegetables Fresh or frozen vegetables (raw, steamed, roasted, or grilled). Low-sodium or reduced-sodium tomato and vegetable juice. Low-sodium or reduced-sodium tomatosauce and tomato paste. Low-sodium or reduced-sodium  canned vegetables. Grains Whole-grain or whole-wheat bread. Whole-grain or whole-wheat pasta. Brown rice. [place]. Bulgur. Whole-grain and low-sodium cereals. Pita bread.Low-fat, low-sodium crackers. Whole-wheat flour tortillas. Meats and other proteins Skinless chicken or [place]. Ground chicken or [place]. Pork with fat trimmed off. Fish and seafood. Egg whites. Dried beans, peas, or lentils. Unsalted nuts, nut butters, and seeds. Unsalted canned beans. Lean cuts of beef with fat trimmed off. Low-sodium, lean precooked or cured meat, such as sausages or meatloaves. Dairy Low-fat (1%) or fat-free (skim) milk. Reduced-fat, low-fat, or fat-free cheeses. Nonfat, low-sodium ricotta or cottage cheese. Low-fat or nonfatyogurt. Low-fat, low-sodium cheese. Fats and oils Soft margarine without trans fats. Vegetable oil. Reduced-fat, low-fat, or light mayonnaise and salad dressings (reduced-sodium). Canola, safflower, olive, avocado, soybean, andsunflower oils. Avocado. Seasonings and condiments Herbs. Spices. Seasoning mixes without salt. Other foods Unsalted popcorn and pretzels. Fat-free sweets. The items listed above may not be a complete list of foods and beverages you can eat. Contact a dietitian for more information. What foods should I avoid? Fruits Canned fruit in a light or heavy syrup. Fried fruit. Fruit in cream or buttersauce. Vegetables Creamed or fried vegetables. Vegetables in a cheese sauce. Regular canned vegetables (not low-sodium or reduced-sodium). Regular canned tomato sauce and paste (not low-sodium or reduced-sodium). Regular tomato and vegetable juice(not low-sodium or reduced-sodium). Angie Fava. Olives. Grains Baked goods made with fat, such as croissants, muffins, or some breads. Drypasta or rice meal packs. Meats and other proteins Fatty cuts of meat. Ribs. Fried meat. Berniece Salines. Bologna, salami, and other precooked or cured meats, such as sausages or meat loaves. Fat from  the back of a pig (fatback). Bratwurst. Salted nuts and seeds. Canned beans with added salt. Canned orsmoked fish. Whole eggs or egg yolks. Chicken or [place] with skin. Dairy Whole or 2% milk, cream, and half-and-half. Whole or full-fat cream cheese. Whole-fat or sweetened yogurt. Full-fat cheese. Nondairy creamers. Whippedtoppings. Processed cheese and cheese spreads. Fats and oils Butter. Stick margarine. Lard. Shortening. Ghee. Bacon fat. Tropical oils, suchas coconut, palm kernel, or palm oil. Seasonings and condiments Onion salt, garlic salt, seasoned salt, table salt, and sea salt. Worcestershire sauce. Tartar sauce. Barbecue sauce. Teriyaki sauce. Soy sauce, including reduced-sodium. Steak sauce. Canned and packaged gravies. Fish sauce. Oyster sauce. Cocktail sauce. Store-bought horseradish. Ketchup. Mustard. Meat flavorings and tenderizers. Bouillon cubes. Hot sauces. Pre-made or packaged marinades. Pre-made or packaged taco seasonings. Relishes. Regular saladdressings. Other foods Salted popcorn and pretzels. The items listed above may not be a complete list of foods and beverages you should avoid. Contact a dietitian for more information. Where to find more information National Heart, Lung, and Blood Institute: https://wilson-eaton.com/ American Heart Association: www.heart.org Academy of Nutrition and Dietetics: www.eatright.[place]: www.kidney.org Summary The DASH eating plan is a healthy eating plan that has been shown to reduce high blood pressure (hypertension). It may also reduce your risk for type 2 diabetes, heart disease, and stroke. When on the DASH eating plan, aim to eat more fresh fruits and vegetables, whole grains, lean proteins, low-fat dairy, and heart-healthy fats. With the DASH  eating plan, you should limit salt (sodium) intake to 2,300 mg a day. If you have hypertension, you may need to reduce your sodium intake to 1,500 mg a day. Work with your health  care provider or dietitian to adjust your eating plan to your individual calorie needs. This information is not intended to replace advice given to you by your health care provider. Make sure you discuss any questions you have with your healthcare provider. Document Revised: 01/09/2019 Document Reviewed: 01/09/2019 Elsevier Patient Education  2022 [employer].

## 2020-09-22 ENCOUNTER — Other Ambulatory Visit: Payer: Self-pay | Admitting: Family Medicine

## 2020-09-27 ENCOUNTER — Telehealth: Payer: Self-pay | Admitting: Family Medicine

## 2020-09-27 NOTE — Telephone Encounter (Signed)
If she chooses to self isolate I would recommend 5 days and do a home test on day 5 and if negative then may return to work If she chooses this option please give her a work note for those days  If she chooses to be seen I can work her in tomorrow at 11:30 AM side door entrance for evaluation and PCR testing and then that test takes 1 to 2 days to come back, if negative she may return to work at that point if positive she would stay out  See what she would like to do then move forward with that option

## 2020-09-27 NOTE — Telephone Encounter (Signed)
Pt contacted and verbalized understanding. Pt states she will quarantine for 5 days and test on day 5. Will give patient note from 89/22-8/14/22 return to work on Monday if negative COVID test. Please sent through [place]. Thank you!

## 2020-09-27 NOTE — Telephone Encounter (Signed)
Pt states she was around 2 people that have tested positive for COVID on Saturday. Pt states she is a little disoriented, having congestion and feel fatigued. Pt tested at home yesterday and was negative. Pt was sent home from work but would like a note to give to employer stating that she is quarantining. Advised pt that she may have tested to early. Symptoms began yesterday evening. Advised pt to test again later and quarantine. Pt verbalized understanding. Please advise. (Schedule booked for today and tomorrow)Thank you.

## 2020-09-27 NOTE — Telephone Encounter (Signed)
Left message to return call 

## 2020-09-28 ENCOUNTER — Encounter: Payer: Self-pay | Admitting: Family Medicine

## 2020-10-26 ENCOUNTER — Other Ambulatory Visit: Payer: Self-pay | Admitting: Family Medicine

## 2020-11-09 ENCOUNTER — Ambulatory Visit (INDEPENDENT_AMBULATORY_CARE_PROVIDER_SITE_OTHER): Payer: 59 | Admitting: Adult Health

## 2020-11-09 ENCOUNTER — Other Ambulatory Visit (HOSPITAL_COMMUNITY)
Admission: RE | Admit: 2020-11-09 | Discharge: 2020-11-09 | Disposition: A | Discharge status: Home / Self Care | Payer: 59 | Source: Ambulatory Visit | Attending: Adult Health | Admitting: Adult Health

## 2020-11-09 ENCOUNTER — Encounter: Payer: Self-pay | Admitting: Adult Health

## 2020-11-09 ENCOUNTER — Other Ambulatory Visit: Payer: Self-pay

## 2020-11-09 VITALS — BP 137/97 | HR 87 | Ht 70.0 in | Wt 292.0 lb

## 2020-11-09 DIAGNOSIS — F32A Depression, unspecified: Secondary | ICD-10-CM | POA: Insufficient documentation

## 2020-11-09 DIAGNOSIS — N920 Excessive and frequent menstruation with regular cycle: Secondary | ICD-10-CM | POA: Diagnosis not present

## 2020-11-09 DIAGNOSIS — G479 Sleep disorder, unspecified: Secondary | ICD-10-CM | POA: Insufficient documentation

## 2020-11-09 DIAGNOSIS — Z01419 Encounter for gynecological examination (general) (routine) without abnormal findings: Secondary | ICD-10-CM | POA: Insufficient documentation

## 2020-11-09 DIAGNOSIS — Z1211 Encounter for screening for malignant neoplasm of colon: Secondary | ICD-10-CM | POA: Insufficient documentation

## 2020-11-09 DIAGNOSIS — Z1231 Encounter for screening mammogram for malignant neoplasm of breast: Secondary | ICD-10-CM | POA: Insufficient documentation

## 2020-11-09 DIAGNOSIS — R61 Generalized hyperhidrosis: Secondary | ICD-10-CM | POA: Insufficient documentation

## 2020-11-09 DIAGNOSIS — R232 Flushing: Secondary | ICD-10-CM | POA: Insufficient documentation

## 2020-11-09 DIAGNOSIS — Z9889 Other specified postprocedural states: Secondary | ICD-10-CM

## 2020-11-09 DIAGNOSIS — Q828 Other specified congenital malformations of skin: Secondary | ICD-10-CM | POA: Insufficient documentation

## 2020-11-09 DIAGNOSIS — F419 Anxiety disorder, unspecified: Secondary | ICD-10-CM

## 2020-11-09 LAB — HEMOCCULT GUIAC POC 1CARD (OFFICE): Fecal Occult Blood, POC: NEGATIVE

## 2020-11-09 MED ORDER — TRAZODONE HCL 50 MG PO TABS
50.0000 mg | ORAL_TABLET | Freq: Every day | ORAL | 2 refills | Status: DC
Start: 2020-11-09 — End: 2021-01-03

## 2020-11-09 NOTE — Progress Notes (Signed)
Patient ID: Carol Sharp, female   DOB: 03-12-1975, 45 y.o.   MRN: [id number] History of Present Illness: Carol Sharp is a 45 year old white female,married, [id number], in for a well woman gyn exam and pap. She is having hot flashes and night sweats and not sleeping(has always had problem) and is spotting some sp ablation.And has occasional headaches. Teary today, daughter is pregnant and 65. PCP is [facility]  Lab Results  Component Value Date   DIAGPAP  10/29/2017    NEGATIVE FOR INTRAEPITHELIAL LESIONS OR MALIGNANCY.   HPV NOT DETECTED 10/29/2017    Current Medications, Allergies, Past Medical History, Past Surgical History, Family History and Social History were reviewed in [employer] record.     Review of Systems: Patient denies any hearing loss, fatigue, blurred vision, shortness of breath, chest pain, abdominal pain, problems with bowel movements, urination, or intercourse. No joint pain or mood swings.  See HPI for positives.   Physical Exam:BP (!) 150/105 (BP Location: Left Arm, Patient Position: Sitting, Cuff Size: Normal)   Pulse 87   Ht 5\' 10"  (1.778 m)   Wt 292 lb (132.5 kg)   LMP 10/31/2020   BMI 41.90 kg/m  BP recheck 137/97 left arm General:  Well developed, well nourished, no acute distress Skin:  Warm and dry,skin tags under arm,breast and neck Neck:  Midline trachea, normal thyroid, good ROM, no lymphadenopathy Lungs; Clear to auscultation bilaterally Breast:  No dominant palpable mass, retraction, or nipple discharge Cardiovascular: Regular rate and rhythm Abdomen:  Soft, non tender, no hepatosplenomegaly Pelvic:  External genitalia is normal in appearance, no lesions.  The vagina is normal in appearance,tan discharge. Has skin tags inner thighs. Urethra has no lesions or masses. The cervix is bulbous. Pap with HR HPV genotyping performed. Uterus is felt to be normal size, shape, and contour.  No adnexal masses or tenderness noted.Bladder is  non tender, no masses felt. Rectal: Good sphincter tone, no polyps, or hemorrhoids felt.  Hemoccult negative. Extremities/musculoskeletal:  No swelling or varicosities noted, no clubbing or cyanosis Psych:  No mood changes, alert and cooperative,seems happy Fall risk is low Depression screen [facility] 2/9 11/09/2020 09/16/2020 08/03/2020  Decreased Interest 2 1 1   Down, Depressed, Hopeless 2 1 0  PHQ - 2 Score 4 2 1   Altered sleeping 3 3 3   Tired, decreased energy 2 1 0  Change in appetite 2 1 0  Feeling bad or failure about yourself  2 1 0  Trouble concentrating 2 3 0  Moving slowly or fidgety/restless 2 2 0  Suicidal thoughts 0 0 0  PHQ-9 Score 17 13 4   Difficult doing work/chores - Not difficult at all Not difficult at all   On zoloft GAD 7 : Generalized Anxiety Score 11/09/2020 09/16/2020 01/04/2020 11/10/2019  Nervous, Anxious, on Edge 2 2 2 3   Control/stop worrying 2 2 2 3   Worry too much - different things 2 2 3 3   Trouble relaxing 2 2 2 3   Restless 2 2 2 3   Easily annoyed or irritable 2 2 2 2   Afraid - awful might happen 2 2 1 2   Total GAD 7 Score 14 14 14 19   Anxiety Difficulty - Not difficult at all Somewhat difficult Very difficult      Upstream - 11/09/20 1521       Pregnancy Intention Screening   Does the patient want to become pregnant in the next year? N/A    Does the patient's partner want  to become pregnant in the next year? No    Would the patient like to discuss contraceptive options today? No      Contraception Wrap Up   Current Method Female Sterilization    End Method Female Sterilization    Contraception Counseling Provided No            Examination chaperoned by Corporate investment banker.   Impression and Plan:  1. Encounter for gynecological examination with Papanicolaou smear of cervix Pap sent Physical in 1 year Pap in 3 if normal - Cytology - PAP Labs with PCP She is seeing GI in December   2. Encounter for screening fecal occult blood testing  - POCT  occult blood stool  3. Spotting   4. S/P endometrial ablation   5. Hot flashes Discussed perimenopause with her  6. Night sweats   7. Sleep disturbance Will try trazodone Meds ordered this encounter  Medications   traZODone (DESYREL) 50 MG tablet    Sig: Take 1 tablet (50 mg total) by mouth at bedtime.    Dispense:  30 tablet    Refill:  2    Order Specific Question:   Supervising Provider    Answer:   Elonda Husky, LUTHER H [[id number]]     8. Anxiety and depression Continue zoloft from PCP  9. Screening mammogram for breast cancer Mammogram scheduled for her at [facility] 11/14/20 at 4:30 pm - MM 3D SCREEN BREAST BILATERAL; Future  10. Accessory skin tags Return in 8 weeks to remove skin tags and ROS

## 2020-11-11 LAB — CYTOLOGY - PAP
Comment: NEGATIVE
Diagnosis: NEGATIVE
High risk HPV: NEGATIVE

## 2020-11-14 ENCOUNTER — Other Ambulatory Visit: Payer: Self-pay

## 2020-11-14 ENCOUNTER — Ambulatory Visit (HOSPITAL_COMMUNITY)
Admission: RE | Admit: 2020-11-14 | Discharge: 2020-11-14 | Disposition: A | Discharge status: Home / Self Care | Payer: 59 | Source: Ambulatory Visit | Attending: Adult Health | Admitting: Adult Health

## 2020-11-14 DIAGNOSIS — Z1231 Encounter for screening mammogram for malignant neoplasm of breast: Secondary | ICD-10-CM | POA: Diagnosis not present

## 2020-11-17 ENCOUNTER — Encounter: Payer: Self-pay | Admitting: Adult Health

## 2020-11-30 ENCOUNTER — Ambulatory Visit: Payer: 59 | Admitting: Gastroenterology

## 2020-12-06 ENCOUNTER — Ambulatory Visit (INDEPENDENT_AMBULATORY_CARE_PROVIDER_SITE_OTHER): Payer: 59 | Admitting: Family Medicine

## 2020-12-06 ENCOUNTER — Other Ambulatory Visit: Payer: Self-pay

## 2020-12-06 DIAGNOSIS — B349 Viral infection, unspecified: Secondary | ICD-10-CM | POA: Diagnosis not present

## 2020-12-06 DIAGNOSIS — R059 Cough, unspecified: Secondary | ICD-10-CM | POA: Diagnosis not present

## 2020-12-06 MED ORDER — HYDROCODONE BIT-HOMATROP MBR 5-1.5 MG/5ML PO SOLN: 5.0000 mL | Freq: Four times a day (QID) | ORAL | 0 refills | Status: AC | PRN

## 2020-12-06 NOTE — Patient Instructions (Signed)
I believe you have a viral illness. This will cause some residual coughing along with viral symptoms of fatigue mild headache low-grade fever off and on over the next 3 to 4 days.  Then you should gradually turn the corner and by Monday into Tuesday be getting back to normal.  It may take you 7 to 14 days to get your energy back.  If you are not significantly improved by Tuesday please let us know.  Feel free to call us sooner if you feel you are getting worse (such as high fever shortness of breath etc).  Currently this is a viral illness and antibiotics will not be effective. Cough medication was called into the pharmacy.  It can cause drowsiness for that so therefore this cough medicine is for home use only.  Do not take with Xanax.  Rest up-let us know if any ongoing troubles-thanks-Dr. Nicki Reaper

## 2020-12-06 NOTE — Progress Notes (Signed)
   Subjective:    Patient ID: Carol Sharp, female    DOB: May 15, 1975, 45 y.o.   MRN: [id number]  HPI Cough , congestion, diarrhea, low grade fevers, sore throat, fatigue- neg home covid test sunday   Review of Systems     Objective:   Physical Exam        Assessment & Plan:

## 2020-12-06 NOTE — Progress Notes (Signed)
   Subjective:    Patient ID: Carol Sharp, female    DOB: 1975/03/28, 45 y.o.   MRN: [id number]  HPI  Patient presents today with respiratory illness Number of days present-5  Symptoms include-slight sore throat off-and-on headache low-grade fever some nausea coughing low energy  Presence of worrisome signs (severe shortness of breath, lethargy, etc.) -no significant shortness of breath no passing out  Recent/current visit to urgent care or ER-none  Recent direct exposure to Covid-none recently went to [facility]  Any current Covid testing-none    Review of Systems     Objective:   Physical Exam  Gen-NAD not toxic TMS-normal bilateral T- normal no redness Chest-CTA respiratory rate normal no crackles CV RRR no murmur Skin-warm dry Neuro-grossly normal       Assessment & Plan:   Viral syndrome Prescription cough medication given to the patient per patient request Caution drowsiness do not take with Xanax Warning signs discussed in detail COVID test taken If progressive troubles or worse follow-up immediately Stay home from work this week If not dramatically better by Monday or Tuesday touch base

## 2020-12-08 LAB — COVID-19, FLU A+B AND RSV
Influenza A, NAA: NOT DETECTED
Influenza B, NAA: NOT DETECTED
RSV, NAA: NOT DETECTED
SARS-CoV-2, NAA: NOT DETECTED

## 2020-12-08 LAB — SPECIMEN STATUS REPORT

## 2020-12-28 DIAGNOSIS — G5603 Carpal tunnel syndrome, bilateral upper limbs: Secondary | ICD-10-CM | POA: Insufficient documentation

## 2020-12-31 ENCOUNTER — Other Ambulatory Visit: Payer: Self-pay | Admitting: Family Medicine

## 2021-01-03 ENCOUNTER — Encounter: Payer: Self-pay | Admitting: Adult Health

## 2021-01-03 ENCOUNTER — Other Ambulatory Visit (HOSPITAL_COMMUNITY)
Admission: RE | Admit: 2021-01-03 | Discharge: 2021-01-03 | Disposition: A | Discharge status: Home / Self Care | Payer: 59 | Source: Ambulatory Visit | Attending: Adult Health | Admitting: Adult Health

## 2021-01-03 ENCOUNTER — Ambulatory Visit: Payer: 59 | Admitting: Adult Health

## 2021-01-03 ENCOUNTER — Other Ambulatory Visit: Payer: Self-pay

## 2021-01-03 VITALS — BP 128/84 | HR 75 | Ht 70.0 in | Wt 286.0 lb

## 2021-01-03 DIAGNOSIS — Z713 Dietary counseling and surveillance: Secondary | ICD-10-CM | POA: Diagnosis not present

## 2021-01-03 DIAGNOSIS — Z6841 Body Mass Index (BMI) 40.0 and over, adult: Secondary | ICD-10-CM | POA: Diagnosis not present

## 2021-01-03 DIAGNOSIS — L918 Other hypertrophic disorders of the skin: Secondary | ICD-10-CM | POA: Diagnosis present

## 2021-01-03 MED ORDER — PHENTERMINE HCL 37.5 MG PO CAPS: ORAL_CAPSULE | ORAL | 0 refills | Status: DC

## 2021-01-03 NOTE — Progress Notes (Signed)
  Subjective:     Patient ID: Carol Sharp, female   DOB: Apr 25, 1975, 45 y.o.   MRN: [id number]  HPI Carol Sharp is a 45 year old white female, married [id number], in for skin tag removal and wants to get on phentermine, to help with weight loss, is going to the gyn and has lost 6 lbs. PCP is Dr Sallee Lange.  Lab Results  Component Value Date   DIAGPAP  11/09/2020    - Negative for intraepithelial lesion or malignancy (NILM)   HPV NOT DETECTED 10/29/2017   [place] Negative 11/09/2020    Review of Systems For skin tag removal  Needs help with weight loss Still has sleeping issues,stopped trazodone due to headaches  Reviewed past medical,surgical, social and family history. Reviewed medications and allergies.     Objective:   Physical Exam BP 128/84 (BP Location: Left Arm, Patient Position: Sitting, Cuff Size: Normal)   Pulse 75   Ht 5\' 10"  (1.778 m)   Wt 286 lb (129.7 kg)   BMI 41.04 kg/m     Skin warm and dry. Lungs: clear to ausculation bilaterally. Cardiovascular: regular rate and rhythm.  Consent signed and time out called. Skin warm and dry, has skin tag right inner thigh, injected with 1 cc 2% lidocaine and cleansed with betadine and then hemostat applied, and used iris scissors to excise, no bleeding, band aide applied, then skin left buttock, injected with 2 cc 2% lidocaine and cleansed with betadine and then hemostat applied to base and then iris scissors used to excise skin tag. Used silver nitrated to one spot that oozed, and band aide applied and both skin tags sent to pathology.  Examination chaperoned by Celene Squibb LPN.   Upstream - 01/03/21 1624       Pregnancy Intention Screening   Does the patient want to become pregnant in the next year? N/A    Does the patient's partner want to become pregnant in the next year? N/A    Would the patient like to discuss contraceptive options today? No      Contraception Wrap Up   Current Method Female Sterilization    End  Method Female Sterilization    Contraception Counseling Provided No             Assessment:     1. Skin tag Removed and sent to pathology  2. Body mass index 40.0-44.9, adult ([place])   3. Weight loss counseling, encounter for Will rx phentermine Meds ordered this encounter  Medications   phentermine 37.5 MG capsule    Sig: Take 1 tablet every morning before meal.    Dispense:  30 capsule    Refill:  0    Order Specific Question:   Supervising Provider    Answer:   Elonda Husky, LUTHER H [[id number]]     Go to gyn  Increase water Decrease portions    Plan:     Follow up in 4 weeks for weigh and BP check

## 2021-01-04 ENCOUNTER — Ambulatory Visit: Payer: 59 | Admitting: Adult Health

## 2021-01-06 LAB — SURGICAL PATHOLOGY

## 2021-01-11 ENCOUNTER — Other Ambulatory Visit: Payer: Self-pay | Admitting: Family Medicine

## 2021-01-11 ENCOUNTER — Telehealth: Payer: Self-pay | Admitting: Family Medicine

## 2021-01-11 NOTE — Telephone Encounter (Signed)
Nurses-FYI-apparently the patient was on valsartan and somehow lisinopril also got approved that was her old medicine  I recommend she stay on valsartan and stop lisinopril  I spoke with pharmacy they have discontinued the lisinopril and will only dispense valsartan  She will need to do a follow-up visit because of her Ativan being controlled medicine she has a 1 month refill currently at the pharmacy

## 2021-01-16 NOTE — Telephone Encounter (Addendum)
Patient notified and scheduled appointment 02/15/21 with Dr Nicki Reaper

## 2021-01-23 ENCOUNTER — Encounter: Payer: Self-pay | Admitting: *Deleted

## 2021-01-23 ENCOUNTER — Ambulatory Visit: Payer: 59 | Admitting: Gastroenterology

## 2021-01-23 ENCOUNTER — Other Ambulatory Visit: Payer: Self-pay

## 2021-01-23 ENCOUNTER — Encounter: Payer: Self-pay | Admitting: Gastroenterology

## 2021-01-23 ENCOUNTER — Telehealth: Payer: Self-pay | Admitting: *Deleted

## 2021-01-23 DIAGNOSIS — Z1211 Encounter for screening for malignant neoplasm of colon: Secondary | ICD-10-CM | POA: Insufficient documentation

## 2021-01-23 MED ORDER — PEG 3350-KCL-NA BICARB-NACL 420 G PO SOLR: ORAL | 0 refills | Status: DC

## 2021-01-23 NOTE — Patient Instructions (Signed)
Colonoscopy to be scheduled. See separate instructions.  ?

## 2021-01-23 NOTE — Telephone Encounter (Signed)
PA approved via [facility] website. Auth# [id number], DOS Feb 16, 2021 - May 17, 2021

## 2021-01-23 NOTE — Progress Notes (Signed)
Primary Care Physician:  Kathyrn Drown, MD  Primary Gastroenterologist:  Elon Alas. Abbey Chatters, DO   Chief Complaint  Patient presents with   Consult    Never had TCS prior. MGM colon cancer    HPI:  Carol Sharp is a 45 y.o. female here at the request of Dr. Sallee Lange for screening colonoscopy. Patient's grandmother had colon cancer at age greater than 73. She is not aware of any first degree relatives with colon polyps.   She denies constipation, diarrhea, melena, brbpr. No abdominal pain. No heartburn, n/v, dysphagia, unintentional weight loss. She is getting ready to start phentermine.    Current Outpatient Medications  Medication Sig Dispense Refill   ALPRAZolam (XANAX) 1 MG tablet TAKE 1 TABLET BY MOUTH AT BEDTIME AS NEEDED FOR SLEEP. 30 tablet 0   chlorthalidone (HYGROTON) 50 MG tablet TAKE 1 TABLET BY MOUTH ONCE A DAY. 30 tablet 0   meloxicam (MOBIC) 15 MG tablet TAKE ONE TABLET BY MOUTH ONCE DAILY 30 tablet 3   phentermine 37.5 MG capsule Take 1 tablet every morning before meal. 30 capsule 0   sertraline (ZOLOFT) 100 MG tablet Take 1 tablet (100 mg total) by mouth daily. 30 tablet 5   valACYclovir (VALTREX) 1000 MG tablet 1 qd to suppress cold sore 30 tablet 12   valsartan (DIOVAN) 80 MG tablet TAKE 1 TABLET BY MOUTH ONCE A DAY. 30 tablet 0   No current facility-administered medications for this visit.    Allergies as of 01/23/2021 - Review Complete 01/23/2021  Allergen Reaction Noted   Amlodipine  12/25/2017    Past Medical History:  Diagnosis Date   Anxiety    Arthritis    Breast nodule 08/02/2015   Depression    Hypertension    MRSA cellulitis May 2005    Past Surgical History:  Procedure Laterality Date   ABLATION  06/19/2008   CESAREAN SECTION     CHOLECYSTECTOMY     DILATION AND CURETTAGE OF UTERUS  11/20/1998   KNEE ARTHROSCOPY WITH MEDIAL MENISECTOMY Right 04/18/2017   Procedure: RIGHT KNEE ARTHROSCOPY WITH PARTIAL  MEDIAL MENISECTOMY;  Surgeon:  Carole Civil, MD;  Location: [facility];  Service: Orthopedics;  Laterality: Right;   TONSILLECTOMY  1981?   TUBAL LIGATION      Family History  Problem Relation Age of Onset   Cancer Paternal Grandmother        breast   Cancer Maternal Grandmother        colon   Hyperlipidemia Father    Hypertension Mother    Diabetes Mother    Hyperlipidemia Mother    Thyroid disease Mother    Seizures Mother    Cancer Paternal Aunt        breast   Cancer Paternal Aunt        breast   Cancer Paternal Aunt        breast    Social History   Socioeconomic History   Marital status: Married    Spouse name: Not on file   Number of children: Not on file   Years of education: Not on file   Highest education level: Not on file  Occupational History   Not on file  Tobacco Use   Smoking status: Some Days    Packs/day: 0.50    Years: 15.00    Pack years: 7.50    Types: Cigarettes   Smokeless tobacco: Never  Vaping Use   Vaping Use: Never used  Substance and  Sexual Activity   Alcohol use: Yes    Comment: occ   Drug use: No   Sexual activity: Yes    Birth control/protection: Surgical    Comment: tubal and ablation  Other Topics Concern   Not on file  Social History Narrative   Not on file   Social Determinants of Health   Financial Resource Strain: Medium Risk   Difficulty of Paying Living Expenses: Somewhat hard  Food Insecurity: Unknown   Worried About Running Out of Food in the Last Year: Patient refused   [place] in the Last Year: Patient refused  Transportation Needs: No Transportation Needs   Lack of Transportation (Medical): No   Lack of Transportation (Non-Medical): No  Physical Activity: Sufficiently Active   Days of Exercise per Week: 4 days   Minutes of Exercise per Session: 40 min  Stress: Stress Concern Present   Feeling of Stress : Very much  Social Connections: Moderately Integrated   Frequency of Communication with Friends and Family: Three times  a week   Frequency of Social Gatherings with Friends and Family: Once a week   Attends Religious Services: More than 4 times per year   Active Member of [employer] or Organizations: No   Attends Music therapist: Never   Marital Status: Married  Human resources officer Violence: Not At Risk   Fear of Current or Ex-Partner: No   Emotionally Abused: No   Physically Abused: No   Sexually Abused: No      ROS:  General: Negative for anorexia, weight loss, fever, chills, fatigue, weakness. Eyes: Negative for vision changes.  ENT: Negative for hoarseness, difficulty swallowing , nasal congestion. CV: Negative for chest pain, angina, palpitations, dyspnea on exertion, peripheral edema.  Respiratory: Negative for dyspnea at rest, dyspnea on exertion, cough, sputum, wheezing.  GI: See history of present illness. GU:  Negative for dysuria, hematuria, urinary incontinence, urinary frequency, nocturnal urination.  MS: Negative for joint pain, low back pain.  Derm: Negative for rash or itching.  Neuro: Negative for weakness, abnormal sensation, seizure, frequent headaches, memory loss, confusion.  Psych: Negative for anxiety, depression, suicidal ideation, hallucinations.  Endo: Negative for unusual weight change.  Heme: Negative for bruising or bleeding. Allergy: Negative for rash or hives.    Physical Examination:  BP (!) 143/98   Pulse 80   Temp (!) 97.1 F (36.2 C)   Ht 5\' 10"  (1.778 m)   Wt 286 lb (129.7 kg)   LMP 01/14/2021   BMI 41.04 kg/m    General: Well-nourished, well-developed in no acute distress.  Head: Normocephalic, atraumatic.   Eyes: Conjunctiva pink, no icterus. Mouth: masked Neck: Supple without thyromegaly, masses, or lymphadenopathy.  Lungs: Clear to auscultation bilaterally.  Heart: Regular rate and rhythm, no murmurs rubs or gallops.  Abdomen: Bowel sounds are normal, nontender, nondistended, no hepatosplenomegaly or masses, no abdominal bruits, no  rebound or guarding.  Fullness above umbilicus without obvious mass, or hernia. Per patient has been present for years since pregnant. Rectal: not performed Extremities: No lower extremity edema. No clubbing or deformities.  Neuro: Alert and oriented x 4 , grossly normal neurologically.  Skin: Warm and dry, no rash or jaundice.   Psych: Alert and cooperative, normal mood and affect.  Labs: Lab Results  Component Value Date   CREATININE 0.88 09/05/2020   BUN 19 09/05/2020   NA 140 09/05/2020   K 4.1 09/05/2020   CL 103 09/05/2020   CO2 21 09/05/2020  Imaging Studies: No results found.   Assessment:  45 y/o female with FH of colon cancer in maternal grandmother presenting to schedule average risk colonoscopy. She denies any GI symptoms. No prior colonoscopy.   Plan:  Colonoscopy with Dr. Abbey Chatters. ASA III (due to BMI).  I have discussed the risks, alternatives, benefits with regards to but not limited to the risk of reaction to medication, bleeding, infection, perforation and the patient is agreeable to proceed. Written consent to be obtained. Patient has been advised to hold phentermine for 7 days prior to her colonoscopy.

## 2021-01-23 NOTE — H&P (View-Only) (Signed)
Primary Care Physician:  Kathyrn Drown, MD  Primary Gastroenterologist:  Elon Alas. Abbey Chatters, DO   Chief Complaint  Patient presents with   Consult    Never had TCS prior. MGM colon cancer    HPI:  Carol Sharp is a 45 y.o. female here at the request of Dr. Sallee Lange for screening colonoscopy. Patient's grandmother had colon cancer at age greater than 65. She is not aware of any first degree relatives with colon polyps.   She denies constipation, diarrhea, melena, brbpr. No abdominal pain. No heartburn, n/v, dysphagia, unintentional weight loss. She is getting ready to start phentermine.    Current Outpatient Medications  Medication Sig Dispense Refill   ALPRAZolam (XANAX) 1 MG tablet TAKE 1 TABLET BY MOUTH AT BEDTIME AS NEEDED FOR SLEEP. 30 tablet 0   chlorthalidone (HYGROTON) 50 MG tablet TAKE 1 TABLET BY MOUTH ONCE A DAY. 30 tablet 0   meloxicam (MOBIC) 15 MG tablet TAKE ONE TABLET BY MOUTH ONCE DAILY 30 tablet 3   phentermine 37.5 MG capsule Take 1 tablet every morning before meal. 30 capsule 0   sertraline (ZOLOFT) 100 MG tablet Take 1 tablet (100 mg total) by mouth daily. 30 tablet 5   valACYclovir (VALTREX) 1000 MG tablet 1 qd to suppress cold sore 30 tablet 12   valsartan (DIOVAN) 80 MG tablet TAKE 1 TABLET BY MOUTH ONCE A DAY. 30 tablet 0   No current facility-administered medications for this visit.    Allergies as of 01/23/2021 - Review Complete 01/23/2021  Allergen Reaction Noted   Amlodipine  12/25/2017    Past Medical History:  Diagnosis Date   Anxiety    Arthritis    Breast nodule 08/02/2015   Depression    Hypertension    MRSA cellulitis May 2005    Past Surgical History:  Procedure Laterality Date   ABLATION  06/19/2008   CESAREAN SECTION     CHOLECYSTECTOMY     DILATION AND CURETTAGE OF UTERUS  11/20/1998   KNEE ARTHROSCOPY WITH MEDIAL MENISECTOMY Right 04/18/2017   Procedure: RIGHT KNEE ARTHROSCOPY WITH PARTIAL  MEDIAL MENISECTOMY;  Surgeon:  Carole Civil, MD;  Location: [facility];  Service: Orthopedics;  Laterality: Right;   TONSILLECTOMY  1981?   TUBAL LIGATION      Family History  Problem Relation Age of Onset   Cancer Paternal Grandmother        breast   Cancer Maternal Grandmother        colon   Hyperlipidemia Father    Hypertension Mother    Diabetes Mother    Hyperlipidemia Mother    Thyroid disease Mother    Seizures Mother    Cancer Paternal Aunt        breast   Cancer Paternal Aunt        breast   Cancer Paternal Aunt        breast    Social History   Socioeconomic History   Marital status: Married    Spouse name: Not on file   Number of children: Not on file   Years of education: Not on file   Highest education level: Not on file  Occupational History   Not on file  Tobacco Use   Smoking status: Some Days    Packs/day: 0.50    Years: 15.00    Pack years: 7.50    Types: Cigarettes   Smokeless tobacco: Never  Vaping Use   Vaping Use: Never used  Substance and  Sexual Activity   Alcohol use: Yes    Comment: occ   Drug use: No   Sexual activity: Yes    Birth control/protection: Surgical    Comment: tubal and ablation  Other Topics Concern   Not on file  Social History Narrative   Not on file   Social Determinants of Health   Financial Resource Strain: Medium Risk   Difficulty of Paying Living Expenses: Somewhat hard  Food Insecurity: Unknown   Worried About Running Out of Food in the Last Year: Patient refused   [place] in the Last Year: Patient refused  Transportation Needs: No Transportation Needs   Lack of Transportation (Medical): No   Lack of Transportation (Non-Medical): No  Physical Activity: Sufficiently Active   Days of Exercise per Week: 4 days   Minutes of Exercise per Session: 40 min  Stress: Stress Concern Present   Feeling of Stress : Very much  Social Connections: Moderately Integrated   Frequency of Communication with Friends and Family: Three times  a week   Frequency of Social Gatherings with Friends and Family: Once a week   Attends Religious Services: More than 4 times per year   Active Member of [employer] or Organizations: No   Attends Music therapist: Never   Marital Status: Married  Human resources officer Violence: Not At Risk   Fear of Current or Ex-Partner: No   Emotionally Abused: No   Physically Abused: No   Sexually Abused: No      ROS:  General: Negative for anorexia, weight loss, fever, chills, fatigue, weakness. Eyes: Negative for vision changes.  ENT: Negative for hoarseness, difficulty swallowing , nasal congestion. CV: Negative for chest pain, angina, palpitations, dyspnea on exertion, peripheral edema.  Respiratory: Negative for dyspnea at rest, dyspnea on exertion, cough, sputum, wheezing.  GI: See history of present illness. GU:  Negative for dysuria, hematuria, urinary incontinence, urinary frequency, nocturnal urination.  MS: Negative for joint pain, low back pain.  Derm: Negative for rash or itching.  Neuro: Negative for weakness, abnormal sensation, seizure, frequent headaches, memory loss, confusion.  Psych: Negative for anxiety, depression, suicidal ideation, hallucinations.  Endo: Negative for unusual weight change.  Heme: Negative for bruising or bleeding. Allergy: Negative for rash or hives.    Physical Examination:  BP (!) 143/98    Pulse 80    Temp (!) 97.1 F (36.2 C)    Ht 5\' 10"  (1.778 m)    Wt 286 lb (129.7 kg)    LMP 01/14/2021    BMI 41.04 kg/m    General: Well-nourished, well-developed in no acute distress.  Head: Normocephalic, atraumatic.   Eyes: Conjunctiva pink, no icterus. Mouth: masked Neck: Supple without thyromegaly, masses, or lymphadenopathy.  Lungs: Clear to auscultation bilaterally.  Heart: Regular rate and rhythm, no murmurs rubs or gallops.  Abdomen: Bowel sounds are normal, nontender, nondistended, no hepatosplenomegaly or masses, no abdominal bruits, no  rebound or guarding.  Fullness above umbilicus without obvious mass, or hernia. Per patient has been present for years since pregnant. Rectal: not performed Extremities: No lower extremity edema. No clubbing or deformities.  Neuro: Alert and oriented x 4 , grossly normal neurologically.  Skin: Warm and dry, no rash or jaundice.   Psych: Alert and cooperative, normal mood and affect.  Labs: Lab Results  Component Value Date   CREATININE 0.88 09/05/2020   BUN 19 09/05/2020   NA 140 09/05/2020   K 4.1 09/05/2020   CL 103 09/05/2020  CO2 21 09/05/2020      Imaging Studies: No results found.   Assessment:  45 y/o female with FH of colon cancer in maternal grandmother presenting to schedule average risk colonoscopy. She denies any GI symptoms. No prior colonoscopy.   Plan:  Colonoscopy with Dr. Abbey Chatters. ASA III (due to BMI).  I have discussed the risks, alternatives, benefits with regards to but not limited to the risk of reaction to medication, bleeding, infection, perforation and the patient is agreeable to proceed. Written consent to be obtained. Patient has been advised to hold phentermine for 7 days prior to her colonoscopy.

## 2021-01-31 ENCOUNTER — Ambulatory Visit: Payer: 59 | Admitting: Adult Health

## 2021-02-02 ENCOUNTER — Other Ambulatory Visit: Payer: Self-pay | Admitting: Family Medicine

## 2021-02-08 ENCOUNTER — Other Ambulatory Visit: Payer: Self-pay | Admitting: Family Medicine

## 2021-02-08 MED ORDER — ALPRAZOLAM 1 MG PO TABS: 1.0000 mg | ORAL_TABLET | Freq: Every evening | ORAL | 0 refills | Status: DC | PRN

## 2021-02-08 MED ORDER — SERTRALINE HCL 100 MG PO TABS: 100.0000 mg | ORAL_TABLET | Freq: Every day | ORAL | 0 refills | Status: DC

## 2021-02-08 NOTE — Telephone Encounter (Signed)
Patient 's appointment has been reschedule from 12/28 until 1/23 and she is needing refill on sertraline 100 mg and alprazolam 1 mg called into [place]

## 2021-02-09 NOTE — Patient Instructions (Signed)
Carol Sharp  02/09/2021     @PREFPERIOPPHARMACY @   Your procedure is scheduled on  02/16/2021.   Report to [facility] at 1130 A.M.   Call this number if you have problems the morning of surgery:  [phone]   Remember:  Follow the diet and prep instructions given to you by the office.    Take these medicines the morning of surgery with A SIP OF WATER                            mobic(if needed), zoloft.     Do not wear jewelry, make-up or nail polish.  Do not wear lotions, powders, or perfumes, or deodorant.  Do not shave 48 hours prior to surgery.  Men may shave face and neck.  Do not bring valuables to the hospital.  [facility] is not responsible for any belongings or valuables.  Contacts, dentures or bridgework may not be worn into surgery.  Leave your suitcase in the car.  After surgery it may be brought to your room.  For patients admitted to the hospital, discharge time will be determined by your treatment team.  Patients discharged the day of surgery will not be allowed to drive home and must have someone with them for 24 hours.    Special instructions:   DO NOT smoke tobacco or vape for 24 hours before your procedure.  Please read over the following fact sheets that you were given. Anesthesia Post-op Instructions and Care and Recovery After Surgery      Colonoscopy, Adult, Care After This sheet gives you information about how to care for yourself after your procedure. Your health care provider may also give you more specific instructions. If you have problems or questions, contact your health care provider. What can I expect after the procedure? After the procedure, it is common to have: A small amount of blood in your stool for 24 hours after the procedure. Some gas. Mild cramping or bloating of your abdomen. Follow these instructions at home: Eating and drinking  Drink enough fluid to keep your urine pale yellow. Follow instructions  from your health care provider about eating or drinking restrictions. Resume your normal diet as instructed by your health care provider. Avoid heavy or fried foods that are hard to digest. Activity Rest as told by your health care provider. Avoid sitting for a long time without moving. Get up to take short walks every 1-2 hours. This is important to improve blood flow and breathing. Ask for help if you feel weak or unsteady. Return to your normal activities as told by your health care provider. Ask your health care provider what activities are safe for you. Managing cramping and bloating  Try walking around when you have cramps or feel bloated. Apply heat to your abdomen as told by your health care provider. Use the heat source that your health care provider recommends, such as a moist heat pack or a heating pad. Place a towel between your skin and the heat source. Leave the heat on for 20-30 minutes. Remove the heat if your skin turns bright red. This is especially important if you are unable to feel pain, heat, or cold. You may have a greater risk of getting burned. General instructions If you were given a sedative during the procedure, it can affect you for several hours. Do not drive or operate machinery until your  health care provider says that it is safe. For the first 24 hours after the procedure: Do not sign important documents. Do not drink alcohol. Do your regular daily activities at a slower pace than normal. Eat soft foods that are easy to digest. Take over-the-counter and prescription medicines only as told by your health care provider. Keep all follow-up visits as told by your health care provider. This is important. Contact a health care provider if: You have blood in your stool 2-3 days after the procedure. Get help right away if you have: More than a small spotting of blood in your stool. Large blood clots in your stool. Swelling of your abdomen. Nausea or vomiting. A  fever. Increasing pain in your abdomen that is not relieved with medicine. Summary After the procedure, it is common to have a small amount of blood in your stool. You may also have mild cramping and bloating of your abdomen. If you were given a sedative during the procedure, it can affect you for several hours. Do not drive or operate machinery until your health care provider says that it is safe. Get help right away if you have a lot of blood in your stool, nausea or vomiting, a fever, or increased pain in your abdomen. This information is not intended to replace advice given to you by your health care provider. Make sure you discuss any questions you have with your health care provider. Document Revised: 12/12/2018 Document Reviewed: 09/01/2018 Elsevier Patient Education  [place] After This sheet gives you information about how to care for yourself after your procedure. Your health care provider may also give you more specific instructions. If you have problems or questions, contact your health care provider. What can I expect after the procedure? After the procedure, it is common to have: Tiredness. Forgetfulness about what happened after the procedure. Impaired judgment for important decisions. Nausea or vomiting. Some difficulty with balance. Follow these instructions at home: For the time period you were told by your health care provider:   Rest as needed. Do not participate in activities where you could fall or become injured. Do not drive or use machinery. Do not drink alcohol. Do not take sleeping pills or medicines that cause drowsiness. Do not make important decisions or sign legal documents. Do not take care of children on your own. Eating and drinking Follow the diet that is recommended by your health care provider. Drink enough fluid to keep your urine pale yellow. If you vomit: Drink water, juice, or soup when you can drink  without vomiting. Make sure you have little or no nausea before eating solid foods. General instructions Have a responsible adult stay with you for the time you are told. It is important to have someone help care for you until you are awake and alert. Take over-the-counter and prescription medicines only as told by your health care provider. If you have sleep apnea, surgery and certain medicines can increase your risk for breathing problems. Follow instructions from your health care provider about wearing your sleep device: Anytime you are sleeping, including during daytime naps. While taking prescription pain medicines, sleeping medicines, or medicines that make you drowsy. Avoid smoking. Keep all follow-up visits as told by your health care provider. This is important. Contact a health care provider if: You keep feeling nauseous or you keep vomiting. You feel light-headed. You are still sleepy or having trouble with balance after 24 hours. You develop a rash. You  have a fever. You have redness or swelling around the IV site. Get help right away if: You have trouble breathing. You have new-onset confusion at home. Summary For several hours after your procedure, you may feel tired. You may also be forgetful and have poor judgment. Have a responsible adult stay with you for the time you are told. It is important to have someone help care for you until you are awake and alert. Rest as told. Do not drive or operate machinery. Do not drink alcohol or take sleeping pills. Get help right away if you have trouble breathing, or if you suddenly become confused. This information is not intended to replace advice given to you by your health care provider. Make sure you discuss any questions you have with your health care provider. Document Revised: 10/22/2019 Document Reviewed: 01/08/2019 Elsevier Patient Education  2022 [employer].

## 2021-02-14 ENCOUNTER — Encounter (HOSPITAL_COMMUNITY)
Admission: RE | Admit: 2021-02-14 | Discharge: 2021-02-14 | Disposition: A | Discharge status: Home / Self Care | Payer: 59 | Source: Ambulatory Visit | Attending: Internal Medicine | Admitting: Internal Medicine

## 2021-02-14 ENCOUNTER — Other Ambulatory Visit: Payer: Self-pay

## 2021-02-14 ENCOUNTER — Encounter (HOSPITAL_COMMUNITY): Payer: Self-pay

## 2021-02-14 VITALS — BP 150/97 | HR 78 | Temp 97.7°F | Resp 18 | Ht 69.05 in | Wt 280.0 lb

## 2021-02-14 DIAGNOSIS — Z79899 Other long term (current) drug therapy: Secondary | ICD-10-CM | POA: Insufficient documentation

## 2021-02-14 DIAGNOSIS — Z01818 Encounter for other preprocedural examination: Secondary | ICD-10-CM | POA: Diagnosis not present

## 2021-02-14 LAB — BASIC METABOLIC PANEL
Anion gap: 10 (ref 5–15)
BUN: 23 mg/dL — ABNORMAL HIGH (ref 6–20)
CO2: 27 mmol/L (ref 22–32)
Calcium: 9.1 mg/dL (ref 8.9–10.3)
Chloride: 102 mmol/L (ref 98–111)
Creatinine, Ser: 0.86 mg/dL (ref 0.44–1.00)
GFR, Estimated: >60 mL/min (ref >60)
Glucose, Bld: 106 mg/dL — ABNORMAL HIGH (ref 70–99)
Potassium: 3.2 mmol/L — ABNORMAL LOW (ref 3.5–5.1)
Sodium: 139 mmol/L (ref 135–145)

## 2021-02-14 LAB — HCG, SERUM, QUALITATIVE: Preg, Serum: NEGATIVE

## 2021-02-15 ENCOUNTER — Ambulatory Visit: Payer: 59 | Admitting: Family Medicine

## 2021-02-16 ENCOUNTER — Ambulatory Visit (HOSPITAL_COMMUNITY): Payer: 59 | Admitting: Anesthesiology

## 2021-02-16 ENCOUNTER — Encounter (HOSPITAL_COMMUNITY)
Admission: RE | Disposition: A | Discharge status: Home / Self Care | Payer: Self-pay | Source: Home / Self Care | Attending: Internal Medicine

## 2021-02-16 ENCOUNTER — Ambulatory Visit (HOSPITAL_COMMUNITY)
Admission: RE | Admit: 2021-02-16 | Discharge: 2021-02-16 | Disposition: A | Discharge status: Home / Self Care | Payer: 59 | Attending: Internal Medicine | Admitting: Internal Medicine

## 2021-02-16 ENCOUNTER — Other Ambulatory Visit: Payer: Self-pay

## 2021-02-16 ENCOUNTER — Encounter (HOSPITAL_COMMUNITY): Payer: Self-pay

## 2021-02-16 ENCOUNTER — Telehealth: Payer: Self-pay | Admitting: Internal Medicine

## 2021-02-16 DIAGNOSIS — Z1211 Encounter for screening for malignant neoplasm of colon: Secondary | ICD-10-CM | POA: Diagnosis not present

## 2021-02-16 DIAGNOSIS — Z800 Family history of malignant neoplasm of digestive organs: Secondary | ICD-10-CM | POA: Insufficient documentation

## 2021-02-16 DIAGNOSIS — F17210 Nicotine dependence, cigarettes, uncomplicated: Secondary | ICD-10-CM | POA: Insufficient documentation

## 2021-02-16 DIAGNOSIS — K429 Umbilical hernia without obstruction or gangrene: Secondary | ICD-10-CM

## 2021-02-16 HISTORY — PX: COLONOSCOPY WITH PROPOFOL: SHX5780

## 2021-02-16 SURGERY — COLONOSCOPY WITH PROPOFOL
Anesthesia: General

## 2021-02-16 MED ORDER — PROPOFOL 10 MG/ML IV BOLUS
INTRAVENOUS | Status: DC | PRN
  Administered 2021-02-16 (×5): 50 mg via INTRAVENOUS
  Administered 2021-02-16: 100 mg via INTRAVENOUS

## 2021-02-16 MED ORDER — MIDAZOLAM HCL 2 MG/2ML IJ SOLN
INTRAMUSCULAR | Status: AC
  Filled 2021-02-16: qty 2

## 2021-02-16 MED ORDER — MIDAZOLAM HCL 2 MG/2ML IJ SOLN
INTRAMUSCULAR | Status: DC | PRN
  Administered 2021-02-16: 2 mg via INTRAVENOUS

## 2021-02-16 MED ORDER — DEXMEDETOMIDINE (PRECEDEX) IN NS 20 MCG/5ML (4 MCG/ML) IV SYRINGE
PREFILLED_SYRINGE | INTRAVENOUS | Status: DC | PRN
  Administered 2021-02-16 (×2): 10 ug via INTRAVENOUS

## 2021-02-16 MED ORDER — LACTATED RINGERS IV SOLN: INTRAVENOUS | Status: DC | PRN

## 2021-02-16 MED ORDER — DEXMEDETOMIDINE (PRECEDEX) IN NS 20 MCG/5ML (4 MCG/ML) IV SYRINGE
PREFILLED_SYRINGE | INTRAVENOUS | Status: AC
  Filled 2021-02-16: qty 5

## 2021-02-16 MED ORDER — LIDOCAINE HCL (CARDIAC) PF 100 MG/5ML IV SOSY
PREFILLED_SYRINGE | INTRAVENOUS | Status: DC | PRN
Start: 2021-02-16 — End: 2021-02-16
  Administered 2021-02-16: 50 mg via INTRAVENOUS

## 2021-02-16 NOTE — Interval H&P Note (Signed)
History and Physical Interval Note:  02/16/2021 11:19 AM  Carol Sharp  has presented today for surgery, with the diagnosis of screening colonoscopy.  The various methods of treatment have been discussed with the patient and family. After consideration of risks, benefits and other options for treatment, the patient has consented to  Procedure(s) with comments: COLONOSCOPY WITH PROPOFOL (N/A) - 1:00pm as a surgical intervention.  The patient's history has been reviewed, patient examined, no change in status, stable for surgery.  I have reviewed the patient's chart and labs.  Questions were answered to the patient's satisfaction.     Eloise Harman

## 2021-02-16 NOTE — Addendum Note (Signed)
Addended by: Cheron Every on: 02/16/2021 01:23 PM   Modules accepted: Orders

## 2021-02-16 NOTE — Discharge Instructions (Addendum)
°  Colonoscopy Discharge Instructions  Read the instructions outlined below and refer to this sheet in the next few weeks. These discharge instructions provide you with general information on caring for yourself after you leave the hospital. Your doctor may also give you specific instructions. While your treatment has been planned according to the most current medical practices available, unavoidable complications occasionally occur.   ACTIVITY You may resume your regular activity, but move at a slower pace for the next 24 hours.  Take frequent rest periods for the next 24 hours.  Walking will help get rid of the air and reduce the bloated feeling in your belly (abdomen).  No driving for 24 hours (because of the medicine (anesthesia) used during the test).   Do not sign any important legal documents or operate any machinery for 24 hours (because of the anesthesia used during the test).  NUTRITION Drink plenty of fluids.  You may resume your normal diet as instructed by your doctor.  Begin with a light meal and progress to your normal diet. Heavy or fried foods are harder to digest and may make you feel sick to your stomach (nauseated).  Avoid alcoholic beverages for 24 hours or as instructed.  MEDICATIONS You may resume your normal medications unless your doctor tells you otherwise.  WHAT YOU CAN EXPECT TODAY Some feelings of bloating in the abdomen.  Passage of more gas than usual.  Spotting of blood in your stool or on the toilet paper.  IF YOU HAD POLYPS REMOVED DURING THE COLONOSCOPY: No aspirin products for 7 days or as instructed.  No alcohol for 7 days or as instructed.  Eat a soft diet for the next 24 hours.  FINDING OUT THE RESULTS OF YOUR TEST Not all test results are available during your visit. If your test results are not back during the visit, make an appointment with your caregiver to find out the results. Do not assume everything is normal if you have not heard from your  caregiver or the medical facility. It is important for you to follow up on all of your test results.  SEEK IMMEDIATE MEDICAL ATTENTION IF: You have more than a spotting of blood in your stool.  Your belly is swollen (abdominal distention).  You are nauseated or vomiting.  You have a temperature over 101.  You have abdominal pain or discomfort that is severe or gets worse throughout the day.   Your colonoscopy was relatively unremarkable.  I did not find any polyps or evidence of colon cancer.  I recommend repeating colonoscopy in 10 years for colon cancer screening purposes.  Otherwise Follow-up with GI as needed.   I hope you have a great rest of your week!  Elon Alas. Abbey Chatters, D.O. Gastroenterology and Hepatology [facility]

## 2021-02-16 NOTE — Anesthesia Procedure Notes (Signed)
Date/Time: 02/16/2021 11:37 AM Performed by: Orlie Dakin, CRNA Pre-anesthesia Checklist: Patient identified, Emergency Drugs available, Suction available and Patient being monitored Patient Re-evaluated:Patient Re-evaluated prior to induction Oxygen Delivery Method: Nasal cannula Induction Type: IV induction Placement Confirmation: positive ETCO2

## 2021-02-16 NOTE — Anesthesia Postprocedure Evaluation (Signed)
Anesthesia Post Note  Patient: Carol Sharp  Procedure(s) Performed: COLONOSCOPY WITH PROPOFOL  Patient location during evaluation: Phase II Anesthesia Type: General Level of consciousness: awake Pain management: pain level controlled Vital Signs Assessment: post-procedure vital signs reviewed and stable Respiratory status: spontaneous breathing and respiratory function stable Cardiovascular status: blood pressure returned to baseline and stable Postop Assessment: no headache and no apparent nausea or vomiting Anesthetic complications: no Comments: Late entry   No notable events documented.   Last Vitals:  Vitals:   02/16/21 1039 02/16/21 1147  BP:  101/65  Pulse:  66  Resp:  16  Temp: (P) 37.1 C 36.7 C  SpO2:  97%    Last Pain:  Vitals:   02/16/21 1147  TempSrc: Oral  PainSc: 0-No pain                 [name]

## 2021-02-16 NOTE — Telephone Encounter (Signed)
Patient requesting referral to general surgery to discuss umbilical hernia repair.  Can we refer to [facility]?  Thank you

## 2021-02-16 NOTE — Anesthesia Preprocedure Evaluation (Signed)
Anesthesia Evaluation  Patient identified by MRN, date of birth, ID band Patient awake    Reviewed: Allergy & Precautions, H&P , NPO status , Patient's Chart, lab work & pertinent test results, reviewed documented beta blocker date and time   Airway Mallampati: II  TM Distance: >3 FB Neck ROM: full    Dental no notable dental hx.    Pulmonary neg pulmonary ROS, Current Smoker,    Pulmonary exam normal breath sounds clear to auscultation       Cardiovascular Exercise Tolerance: Good hypertension, negative cardio ROS   Rhythm:regular Rate:Normal     Neuro/Psych PSYCHIATRIC DISORDERS Anxiety Depression negative neurological ROS     GI/Hepatic negative GI ROS, Neg liver ROS,   Endo/Other  Morbid obesity  Renal/GU negative Renal ROS  negative genitourinary   Musculoskeletal   Abdominal   Peds  Hematology negative hematology ROS (+)   Anesthesia Other Findings   Reproductive/Obstetrics negative OB ROS                             Anesthesia Physical Anesthesia Plan  ASA: 3  Anesthesia Plan: General   Post-op Pain Management:    Induction:   PONV Risk Score and Plan: Propofol infusion  Airway Management Planned:   Additional Equipment:   Intra-op Plan:   Post-operative Plan:   Informed Consent: I have reviewed the patients History and Physical, chart, labs and discussed the procedure including the risks, benefits and alternatives for the proposed anesthesia with the patient or authorized representative who has indicated his/her understanding and acceptance.     Dental Advisory Given  Plan Discussed with: CRNA  Anesthesia Plan Comments:         Anesthesia Quick Evaluation

## 2021-02-16 NOTE — Telephone Encounter (Signed)
Referral placed.

## 2021-02-16 NOTE — Op Note (Signed)
[facility] Patient Name: Carol Sharp Procedure Date: 02/16/2021 11:25 AM MRN: [id number] Date of Birth: 12/05/1975 Attending MD: Elon Alas. Edgar Frisk CSN: [id number] Age: 45 Admit Type: Outpatient Procedure:                Colonoscopy Indications:              Screening for colorectal malignant neoplasm Providers:                Elon Alas. Abbey Chatters, DO, Janeece Riggers, RN, Hughie Closs, RN, Randa Spike, Technician Referring MD:              Medicines:                See the Anesthesia note for documentation of the                            administered medications Complications:            No immediate complications. Estimated Blood Loss:     Estimated blood loss: none. Procedure:                Pre-Anesthesia Assessment:                           - The anesthesia plan was to use monitored                            anesthesia care (MAC).                           After obtaining informed consent, the colonoscope                            was passed under direct vision. Throughout the                            procedure, the patient's blood pressure, pulse, and                            oxygen saturations were monitored continuously. The                            [id number]-HQ190L ([id number]) scope was introduced through                            the anus and advanced to the the cecum, identified                            by appendiceal orifice and ileocecal valve. The                            colonoscopy was performed without difficulty. The                            patient tolerated the procedure  well. The quality                            of the bowel preparation was evaluated using the                            BBPS [facility] Bowel Preparation Scale) with scores                            of: Right Colon = 3, Transverse Colon = 3 and Left                            Colon = 3 (entire mucosa seen well with no residual                             staining, small fragments of stool or opaque                            liquid). The total BBPS score equals 9. Scope In: 11:29:40 AM Scope Out: 11:40:52 AM Scope Withdrawal Time: 0 hours 8 minutes 6 seconds  Total Procedure Duration: 0 hours 11 minutes 12 seconds  Findings:      The perianal and digital rectal examinations were normal.      The entire examined colon appeared normal. Impression:               - The entire examined colon is normal.                           - No specimens collected. Moderate Sedation:      Per Anesthesia Care Recommendation:           - Patient has a contact number available for                            emergencies. The signs and symptoms of potential                            delayed complications were discussed with the                            patient. Return to normal activities tomorrow.                            Written discharge instructions were provided to the                            patient.                           - Resume previous diet.                           - Continue present medications.                           - Repeat  colonoscopy in 10 years for screening                            purposes.                           - Return to GI clinic PRN. Procedure Code(s):        --- Professional ---                           J2878, Colorectal cancer screening; colonoscopy on                            individual not meeting criteria for high risk Diagnosis Code(s):        --- Professional ---                           Z12.11, Encounter for screening for malignant                            neoplasm of colon CPT copyright 2019 American Medical Association. All rights reserved. The codes documented in this report are preliminary and upon coder review may  be revised to meet current compliance requirements. Elon Alas. Abbey Chatters, DO [place] Abbey Chatters, DO 02/16/2021 11:44:16 AM This report has been signed electronically. Number of  Addenda: 0

## 2021-02-16 NOTE — Transfer of Care (Signed)
Immediate Anesthesia Transfer of Care Note  Patient: Carol Sharp  Procedure(s) Performed: COLONOSCOPY WITH PROPOFOL  Patient Location: Short Stay  Anesthesia Type:General  Level of Consciousness: awake and oriented  Airway & Oxygen Therapy: Patient Spontanous Breathing  Post-op Assessment: Report given to RN, Post -op Vital signs reviewed and stable and Patient moving all extremities X 4  Post vital signs: Reviewed and stable  Last Vitals:  Vitals Value Taken Time  BP    Temp    Pulse    Resp    SpO2      Last Pain:  Vitals:   02/16/21 1123  TempSrc:   PainSc: 0-No pain         Complications: No notable events documented.

## 2021-02-17 ENCOUNTER — Other Ambulatory Visit: Payer: Self-pay | Admitting: Family Medicine

## 2021-02-22 ENCOUNTER — Encounter (HOSPITAL_COMMUNITY): Payer: Self-pay | Admitting: Internal Medicine

## 2021-02-28 ENCOUNTER — Ambulatory Visit (INDEPENDENT_AMBULATORY_CARE_PROVIDER_SITE_OTHER): Payer: PRIVATE HEALTH INSURANCE | Admitting: General Surgery

## 2021-02-28 ENCOUNTER — Encounter: Payer: Self-pay | Admitting: General Surgery

## 2021-02-28 ENCOUNTER — Other Ambulatory Visit: Payer: Self-pay

## 2021-02-28 VITALS — BP 123/91 | HR 86 | Temp 98.4°F | Resp 16 | Ht 69.5 in | Wt 290.0 lb

## 2021-02-28 DIAGNOSIS — K432 Incisional hernia without obstruction or gangrene: Secondary | ICD-10-CM

## 2021-03-01 NOTE — H&P (Signed)
Carol Sharp; [id number]; 1975-09-08   HPI Patient is a 46 year old white female who was referred to my care by Dr. Sallee Lange for a ventral hernia.  Patient states she has had a periumbilical hernia for some time, but recently is increasing in size and causing her discomfort.  She has been losing some weight and it is more noticeable.  It seems to be more noticeable after her C-section.  She has had a laparoscopic cholecystectomy in the past.  She denies any nausea or vomiting.  Made worse with straining. Past Medical History:  Diagnosis Date   Anxiety    Arthritis    Breast nodule 08/02/2015   Depression    Hypertension    MRSA cellulitis May 2005    Past Surgical History:  Procedure Laterality Date   ABLATION  06/19/2008   CESAREAN SECTION     CHOLECYSTECTOMY     COLONOSCOPY WITH PROPOFOL N/A 02/16/2021   Procedure: COLONOSCOPY WITH PROPOFOL;  Surgeon: Eloise Harman, DO;  [facility]: [facility];  Service: Endoscopy;  Laterality: N/A;  1:00pm   DILATION AND CURETTAGE OF UTERUS  11/20/1998   KNEE ARTHROSCOPY WITH MEDIAL MENISECTOMY Right 04/18/2017   Procedure: RIGHT KNEE ARTHROSCOPY WITH PARTIAL  MEDIAL MENISECTOMY;  Surgeon: Carole Civil, MD;  Location: [facility];  Service: Orthopedics;  Laterality: Right;   TONSILLECTOMY  1981?   TUBAL LIGATION      Family History  Problem Relation Age of Onset   Cancer Paternal Grandmother        breast   Cancer Maternal Grandmother        colon   Hyperlipidemia Father    Hypertension Mother    Diabetes Mother    Hyperlipidemia Mother    Thyroid disease Mother    Seizures Mother    Cancer Paternal Aunt        breast   Cancer Paternal Aunt        breast   Cancer Paternal Aunt        breast    Current Outpatient Medications on File Prior to Visit  Medication Sig Dispense Refill   ALPRAZolam (XANAX) 1 MG tablet Take 1 tablet (1 mg total) by mouth at bedtime as needed. for sleep 30 tablet 0   chlorthalidone (HYGROTON)  50 MG tablet TAKE 1 TABLET BY MOUTH ONCE A DAY. 30 tablet 0   meloxicam (MOBIC) 15 MG tablet TAKE ONE TABLET BY MOUTH ONCE DAILY 30 tablet 3   sertraline (ZOLOFT) 100 MG tablet Take 1 tablet (100 mg total) by mouth daily. 30 tablet 0   valACYclovir (VALTREX) 1000 MG tablet 1 qd to suppress cold sore (Patient taking differently: 1,000 mg daily as needed (cold sores).) 30 tablet 12   valsartan (DIOVAN) 80 MG tablet TAKE 1 TABLET BY MOUTH ONCE A DAY. 30 tablet 3   phentermine 37.5 MG capsule Take 1 tablet every morning before meal. (Patient not taking: Reported on 02/28/2021) 30 capsule 0   No current facility-administered medications on file prior to visit.    Allergies  Allergen Reactions   Amlodipine     swelling    Social History   Substance and Sexual Activity  Alcohol Use Yes   Comment: occ    Social History   Tobacco Use  Smoking Status Some Days   Packs/day: 0.50   Years: 15.00   Pack years: 7.50   Types: Cigarettes  Smokeless Tobacco Never    Review of Systems  Constitutional: Negative.  HENT: Negative.    Eyes: Negative.   Respiratory: Negative.    Cardiovascular: Negative.   Gastrointestinal:  Positive for abdominal pain, heartburn and nausea.  Genitourinary: Negative.   Musculoskeletal:  Positive for joint pain.  Skin:  Positive for itching.  Neurological: Negative.   Endo/Heme/Allergies: Negative.   Psychiatric/Behavioral: Negative.     Objective   Vitals:   02/28/21 1456  BP: (!) 123/91  Pulse: 86  Resp: 16  Temp: 98.4 F (36.9 C)  SpO2: 95%    Physical Exam Vitals reviewed.  Constitutional:      Appearance: Normal appearance. She is obese. She is not ill-appearing.  HENT:     Head: Normocephalic and atraumatic.  Cardiovascular:     Rate and Rhythm: Normal rate and regular rhythm.     Heart sounds: Normal heart sounds. No murmur heard.   No friction rub. No gallop.  Pulmonary:     Effort: Pulmonary effort is normal. No respiratory  distress.     Breath sounds: Normal breath sounds. No stridor. No wheezing, rhonchi or rales.  Abdominal:     General: Bowel sounds are normal. There is no distension.     Palpations: Abdomen is soft. There is no mass.     Tenderness: There is abdominal tenderness. There is no guarding or rebound.     Hernia: A hernia is present.     Comments: Ill-defined greater than 3 cm supraumbilical hernia that appears to be emanating from the midline just superior to a surgical incision site.  It appears to be reducible, though I cannot definitively feel the edges of the hernia defect.    Skin:    General: Skin is warm and dry.  Neurological:     Mental Status: She is alert and oriented to person, place, and time.    Assessment  Incisional hernia, greater than 3 cm Plan  Patient is scheduled for a laparoscopic incisional herniorrhaphy with mesh on 03/24/2021.  The risks and benefits of the procedure including bleeding, infection, mesh use, bowel injury, the possibility of recurrence of the hernia were fully explained to the patient, who gave informed consent.

## 2021-03-01 NOTE — Progress Notes (Signed)
Carol Sharp; [id number]; 1975-08-26   HPI Patient is a 46 year old white female who was referred to my care by Dr. Sallee Lange for a ventral hernia.  Patient states she has had a periumbilical hernia for some time, but recently is increasing in size and causing her discomfort.  She has been losing some weight and it is more noticeable.  It seems to be more noticeable after her C-section.  She has had a laparoscopic cholecystectomy in the past.  She denies any nausea or vomiting.  Made worse with straining. Past Medical History:  Diagnosis Date   Anxiety    Arthritis    Breast nodule 08/02/2015   Depression    Hypertension    MRSA cellulitis May 2005    Past Surgical History:  Procedure Laterality Date   ABLATION  06/19/2008   CESAREAN SECTION     CHOLECYSTECTOMY     COLONOSCOPY WITH PROPOFOL N/A 02/16/2021   Procedure: COLONOSCOPY WITH PROPOFOL;  Surgeon: Eloise Harman, DO;  Location: [facility];  Service: Endoscopy;  Laterality: N/A;  1:00pm   DILATION AND CURETTAGE OF UTERUS  11/20/1998   KNEE ARTHROSCOPY WITH MEDIAL MENISECTOMY Right 04/18/2017   Procedure: RIGHT KNEE ARTHROSCOPY WITH PARTIAL  MEDIAL MENISECTOMY;  Surgeon: Carole Civil, MD;  Location: [facility];  Service: Orthopedics;  Laterality: Right;   TONSILLECTOMY  1981?   TUBAL LIGATION      Family History  Problem Relation Age of Onset   Cancer Paternal Grandmother        breast   Cancer Maternal Grandmother        colon   Hyperlipidemia Father    Hypertension Mother    Diabetes Mother    Hyperlipidemia Mother    Thyroid disease Mother    Seizures Mother    Cancer Paternal Aunt        breast   Cancer Paternal Aunt        breast   Cancer Paternal Aunt        breast    Current Outpatient Medications on File Prior to Visit  Medication Sig Dispense Refill   ALPRAZolam (XANAX) 1 MG tablet Take 1 tablet (1 mg total) by mouth at bedtime as needed. for sleep 30 tablet 0   chlorthalidone (HYGROTON)  50 MG tablet TAKE 1 TABLET BY MOUTH ONCE A DAY. 30 tablet 0   meloxicam (MOBIC) 15 MG tablet TAKE ONE TABLET BY MOUTH ONCE DAILY 30 tablet 3   sertraline (ZOLOFT) 100 MG tablet Take 1 tablet (100 mg total) by mouth daily. 30 tablet 0   valACYclovir (VALTREX) 1000 MG tablet 1 qd to suppress cold sore (Patient taking differently: 1,000 mg daily as needed (cold sores).) 30 tablet 12   valsartan (DIOVAN) 80 MG tablet TAKE 1 TABLET BY MOUTH ONCE A DAY. 30 tablet 3   phentermine 37.5 MG capsule Take 1 tablet every morning before meal. (Patient not taking: Reported on 02/28/2021) 30 capsule 0   No current facility-administered medications on file prior to visit.    Allergies  Allergen Reactions   Amlodipine     swelling    Social History   Substance and Sexual Activity  Alcohol Use Yes   Comment: occ    Social History   Tobacco Use  Smoking Status Some Days   Packs/day: 0.50   Years: 15.00   Pack years: 7.50   Types: Cigarettes  Smokeless Tobacco Never    Review of Systems  Constitutional: Negative.  HENT: Negative.    Eyes: Negative.   Respiratory: Negative.    Cardiovascular: Negative.   Gastrointestinal:  Positive for abdominal pain, heartburn and nausea.  Genitourinary: Negative.   Musculoskeletal:  Positive for joint pain.  Skin:  Positive for itching.  Neurological: Negative.   Endo/Heme/Allergies: Negative.   Psychiatric/Behavioral: Negative.     Objective   Vitals:   02/28/21 1456  BP: (!) 123/91  Pulse: 86  Resp: 16  Temp: 98.4 F (36.9 C)  SpO2: 95%    Physical Exam Vitals reviewed.  Constitutional:      Appearance: Normal appearance. She is obese. She is not ill-appearing.  HENT:     Head: Normocephalic and atraumatic.  Cardiovascular:     Rate and Rhythm: Normal rate and regular rhythm.     Heart sounds: Normal heart sounds. No murmur heard.   No friction rub. No gallop.  Pulmonary:     Effort: Pulmonary effort is normal. No respiratory  distress.     Breath sounds: Normal breath sounds. No stridor. No wheezing, rhonchi or rales.  Abdominal:     General: Bowel sounds are normal. There is no distension.     Palpations: Abdomen is soft. There is no mass.     Tenderness: There is abdominal tenderness. There is no guarding or rebound.     Hernia: A hernia is present.     Comments: Ill-defined greater than 3 cm supraumbilical hernia that appears to be emanating from the midline just superior to a surgical incision site.  It appears to be reducible, though I cannot definitively feel the edges of the hernia defect.    Skin:    General: Skin is warm and dry.  Neurological:     Mental Status: She is alert and oriented to person, place, and time.    Assessment  Incisional hernia, greater than 3 cm Plan  Patient is scheduled for a laparoscopic incisional herniorrhaphy with mesh on 03/24/2021.  The risks and benefits of the procedure including bleeding, infection, mesh use, bowel injury, the possibility of recurrence of the hernia were fully explained to the patient, who gave informed consent.

## 2021-03-13 ENCOUNTER — Ambulatory Visit (INDEPENDENT_AMBULATORY_CARE_PROVIDER_SITE_OTHER): Payer: PRIVATE HEALTH INSURANCE | Admitting: Family Medicine

## 2021-03-13 ENCOUNTER — Encounter: Payer: Self-pay | Admitting: Family Medicine

## 2021-03-13 ENCOUNTER — Other Ambulatory Visit: Payer: Self-pay

## 2021-03-13 VITALS — BP 122/86 | HR 62 | Temp 97.3°F | Ht 69.5 in | Wt 289.0 lb

## 2021-03-13 DIAGNOSIS — Z79899 Other long term (current) drug therapy: Secondary | ICD-10-CM

## 2021-03-13 DIAGNOSIS — E785 Hyperlipidemia, unspecified: Secondary | ICD-10-CM | POA: Diagnosis not present

## 2021-03-13 DIAGNOSIS — G4700 Insomnia, unspecified: Secondary | ICD-10-CM

## 2021-03-13 DIAGNOSIS — I10 Essential (primary) hypertension: Secondary | ICD-10-CM | POA: Diagnosis not present

## 2021-03-13 DIAGNOSIS — Z6841 Body Mass Index (BMI) 40.0 and over, adult: Secondary | ICD-10-CM

## 2021-03-13 MED ORDER — VALACYCLOVIR HCL 1 G PO TABS: ORAL_TABLET | ORAL | 1 refills | Status: DC

## 2021-03-13 MED ORDER — ZOLPIDEM TARTRATE 5 MG PO TABS: 5.0000 mg | ORAL_TABLET | Freq: Every evening | ORAL | 0 refills | Status: DC | PRN

## 2021-03-13 MED ORDER — ALPRAZOLAM 0.5 MG PO TABS: ORAL_TABLET | ORAL | 0 refills | Status: DC

## 2021-03-13 MED ORDER — CHLORTHALIDONE 50 MG PO TABS: 50.0000 mg | ORAL_TABLET | Freq: Every day | ORAL | 1 refills | Status: DC

## 2021-03-13 MED ORDER — SERTRALINE HCL 100 MG PO TABS: 100.0000 mg | ORAL_TABLET | Freq: Every day | ORAL | 1 refills | Status: DC

## 2021-03-13 MED ORDER — VALSARTAN 80 MG PO TABS: 80.0000 mg | ORAL_TABLET | Freq: Every day | ORAL | 1 refills | Status: DC

## 2021-03-13 MED ORDER — MELOXICAM 15 MG PO TABS
15.0000 mg | ORAL_TABLET | Freq: Every day | ORAL | 1 refills | Status: DC
Start: 2021-03-13 — End: 2021-09-01

## 2021-03-13 NOTE — Progress Notes (Signed)
° °  Subjective:    Patient ID: Carol Sharp, female    DOB: May 25, 1975, 46 y.o.   MRN: [id number]  HPI Patient here for follow up on medications. Benign essential hypertension - Plan: Basic Metabolic Panel (BMET)  Hyperlipidemia, unspecified hyperlipidemia type - Plan: Lipid Profile  High risk medication use - Plan: Basic Metabolic Panel (BMET) Obesity patient is trying to watch her portions stay active lose weight She does take her blood pressure medicine regular basis.  She is willing to get testing for her potassium and kidney function  She does find her self frustrated because of stress at work but she is hoping that that will get better she is looking at other opportunities She does take her sertraline on a regular basis states that helps She does states she has significant insomnia issues  Review of Systems     Objective:   Physical Exam General-in no acute distress Eyes-no discharge Lungs-respiratory rate normal, CTA CV-no murmurs,RRR Extremities skin warm dry no edema Neuro grossly normal Behavior normal, alert        Assessment & Plan:  1. Benign essential hypertension Blood pressure good control continue current measures.  Check metabolic 7 because on diuretic - Basic Metabolic Panel (BMET)  2. Hyperlipidemia, unspecified hyperlipidemia type Healthy diet regular activity recheck lipid profile.  May or may not need to be on statin - Lipid Profile  3. High risk medication use Lab work ordered. - Basic Metabolic Panel (BMET)  4. Body mass index 40.0-44.9, adult (HCC) Portion control regular activity recommended  Insomnia-patient has not been doing well on the Xanax only getting a few hours sleep at night we did discuss various measures I gave her a printout of behavioral approaches for sleep she is already staying away from caffeine exercising and reducing her stress and cutting back on smoking.  We will try Ambien 5 mg nightly she is to devote at least 8  hours toward sleep for safety purposes Only use medication when at home Xanax half or a whole tablet of 0.5 mg may be used during the day as needed for anxiousness or stress Continue sertraline Send [place] update within 1 month how that is going Follow-up within 6 months

## 2021-03-13 NOTE — Patient Instructions (Signed)
Please do your blood work as recommended  We will try Ambien 1 tablet at bedtime to help with sleep It is important to take this approximately 30 to 45 minutes before sleep Home use only With this medication it is very important to set aside 8 hours before doing any activities such as driving or operating heavy machinery  Xanax 0.5 mg may be used during the day when necessary for stress or anxiety Never take Xanax with Ambien at nighttime  Feel free to send [place] a MyChart message in several weeks to let us know how things are going  Notify us if any problems  Follow-up within 4 to 6 months

## 2021-03-14 ENCOUNTER — Other Ambulatory Visit: Payer: Self-pay | Admitting: Family Medicine

## 2021-03-14 DIAGNOSIS — E785 Hyperlipidemia, unspecified: Secondary | ICD-10-CM

## 2021-03-14 DIAGNOSIS — Z79899 Other long term (current) drug therapy: Secondary | ICD-10-CM

## 2021-03-14 LAB — BASIC METABOLIC PANEL
BUN/Creatinine Ratio: 21 (ref 9–23)
BUN: 23 mg/dL (ref 6–24)
CO2: 22 mmol/L (ref 20–29)
Calcium: 9.3 mg/dL (ref 8.7–10.2)
Chloride: 104 mmol/L (ref 96–106)
Creatinine, Ser: 1.07 mg/dL — ABNORMAL HIGH (ref 0.57–1.00)
Glucose: 72 mg/dL (ref 70–99)
Potassium: 4 mmol/L (ref 3.5–5.2)
Sodium: 142 mmol/L (ref 134–144)
eGFR: 65 mL/min/{1.73_m2} (ref >59)

## 2021-03-14 LAB — LIPID PANEL
Chol/HDL Ratio: 5.5 ratio — ABNORMAL HIGH (ref 0.0–4.4)
Cholesterol, Total: 244 mg/dL — ABNORMAL HIGH (ref 100–199)
HDL: 44 mg/dL (ref >39)
LDL Chol Calc (NIH): 94 mg/dL (ref 0–99)
Triglycerides: 638 mg/dL (ref 0–149)
VLDL Cholesterol Cal: 106 mg/dL — ABNORMAL HIGH (ref 5–40)

## 2021-03-17 NOTE — Patient Instructions (Signed)
Carol Sharp  03/17/2021     @PREFPERIOPPHARMACY @   Your procedure is scheduled on  03/24/2021.   Report to [facility] at  0700 A.M.   Call this number if you have problems the morning of surgery:  [phone]   Remember:  Do not eat or drink after midnight.      Take these medicines the morning of surgery with A SIP OF WATER                     xanax(if needed), mobic(if needed).     Do not wear jewelry, make-up or nail polish.  Do not wear lotions, powders, or perfumes, or deodorant.  Do not shave 48 hours prior to surgery.  Men may shave face and neck.  Do not bring valuables to the hospital.  [facility] is not responsible for any belongings or valuables.  Contacts, dentures or bridgework may not be worn into surgery.  Leave your suitcase in the car.  After surgery it may be brought to your room.  For patients admitted to the hospital, discharge time will be determined by your treatment team.  Patients discharged the day of surgery will not be allowed to drive home and must have someone with them for 24 hours.    Special instructions:   DO  NOT smoke tobacco or vape for 24 hours before your procedure.  Please read over the following fact sheets that you were given. Coughing and Deep Breathing, Surgical Site Infection Prevention, Anesthesia Post-op Instructions, and Care and Recovery After Surgery      Laparoscopic Ventral Hernia Repair, Care After The following information offers guidance on how to care for yourself after your procedure. Your health care provider may also give you more specific instructions. If you have problems or questions, contact your health care provider. What can I expect after the procedure? After the procedure, it is common to have pain, discomfort, or soreness. Follow these instructions at home: Medicines Take over-the-counter and prescription medicines only as told by your health care provider. Ask your health care  provider if the medicine prescribed to you: Requires you to avoid driving or using machinery. Can cause constipation. You may need to take these actions to prevent or treat constipation: Drink enough fluid to keep your urine pale yellow. Take over-the-counter or prescription medicines. Eat foods that are high in fiber, such as beans, whole grains, and fresh fruits and vegetables. Limit foods that are high in fat and processed sugars, such as fried or sweet foods. Incision care  Follow instructions from your health care provider about how to take care of your incisions. Make sure you: Wash your hands with soap and water for at least 20 seconds before and after you change your bandage (dressing) or before you touch your abdomen. If soap and water are not available, use hand sanitizer. Change your dressing as told by your health care provider. Leave stitches (sutures), skin glue, or adhesive strips in place. These skin closures may need to stay in place for 2 weeks or longer. If adhesive strip edges start to loosen and curl up, you may trim the loose edges. Do not remove adhesive strips completely unless your health care provider tells you to do that. Check your incision areas every day for signs of infection. Check for: More redness, swelling, or pain. Fluid or blood. Warmth. Pus or a bad smell. Bathing  Do not take baths, swim,  or use a hot tub until your health care provider approves. Ask your health care provider if you may take showers. You may only be allowed to take sponge baths. Keep your dressing dry until your health care provider says it can be removed. Activity  Rest as told by your health care provider. Avoid sitting for a long time without moving. Get up to take short walks every 1-2 hours. This is important to improve blood flow and breathing. Ask for help if you feel weak or unsteady. Do not lift anything that is heavier than 10 lb (4.5 kg), or the limit that you are told,  until your health care provider says that it is safe. If you were given a sedative during the procedure, it can affect you for several hours. Do not drive or operate machinery until your health care provider says that it is safe. Return to your normal activities as told by your health care provider. Ask your health care provider what activities are safe for you. General instructions  Hold a pillow over your abdomen when you cough or sneeze. This helps with pain. Do not use any products that contain nicotine or tobacco. These products include cigarettes, chewing tobacco, and vaping devices, such as e-cigarettes. These can delay healing after surgery. If you need help quitting, ask your health care provider. You may be asked to continue to do deep breathing exercises at home. This will help to prevent a lung infection. Keep all follow-up visits. This is important. Contact a health care provider if: You have any of these signs of infection: More redness, swelling, or pain around an incision. Fluid or blood coming from an incision. Warmth coming from an incision. Pus or a bad smell coming from an incision. A fever or chills. You have pain that gets worse or does not get better with medicine. You have nausea or vomiting. You have a cough. You have shortness of breath. You have not had a bowel movement in 3 days. You are not able to urinate. Get help right away if you have: Severe pain in your abdomen. Persistent nausea and vomiting. Redness, warmth, or pain in your leg. Chest pain. Trouble breathing. These symptoms may represent a serious problem that is an emergency. Do not wait to see if the symptoms will go away. Get medical help right away. Call your local emergency services (911 in the [place] Do not drive yourself to the hospital. Summary After this procedure, it is common to have pain, discomfort, or soreness. Follow instructions from your health care provider about how to take care  of your incision. Check your incision area every day for signs of infection. Report any signs of infection to your health care provider. Keep all follow-up visits. This is important. This information is not intended to replace advice given to you by your health care provider. Make sure you discuss any questions you have with your health care provider. Document Revised: 09/25/2019 Document Reviewed: 09/25/2019 Elsevier Patient Education  [place] Anesthesia, Adult, Care After This sheet gives you information about how to care for yourself after your procedure. Your health care provider may also give you more specific instructions. If you have problems or questions, contact your health care provider. What can I expect after the procedure? After the procedure, the following side effects are common: Pain or discomfort at the IV site. Nausea. Vomiting. Sore throat. Trouble concentrating. Feeling cold or chills. Feeling weak or tired. Sleepiness and fatigue. Soreness and body  aches. These side effects can affect parts of the body that were not involved in surgery. Follow these instructions at home: For the time period you were told by your health care provider:  Rest. Do not participate in activities where you could fall or become injured. Do not drive or use machinery. Do not drink alcohol. Do not take sleeping pills or medicines that cause drowsiness. Do not make important decisions or sign legal documents. Do not take care of children on your own. Eating and drinking Follow any instructions from your health care provider about eating or drinking restrictions. When you feel hungry, start by eating small amounts of foods that are soft and easy to digest (bland), such as toast. Gradually return to your regular diet. Drink enough fluid to keep your urine pale yellow. If you vomit, rehydrate by drinking water, juice, or clear broth. General instructions If you have sleep  apnea, surgery and certain medicines can increase your risk for breathing problems. Follow instructions from your health care provider about wearing your sleep device: Anytime you are sleeping, including during daytime naps. While taking prescription pain medicines, sleeping medicines, or medicines that make you drowsy. Have a responsible adult stay with you for the time you are told. It is important to have someone help care for you until you are awake and alert. Return to your normal activities as told by your health care provider. Ask your health care provider what activities are safe for you. Take over-the-counter and prescription medicines only as told by your health care provider. If you smoke, do not smoke without supervision. Keep all follow-up visits as told by your health care provider. This is important. Contact a health care provider if: You have nausea or vomiting that does not get better with medicine. You cannot eat or drink without vomiting. You have pain that does not get better with medicine. You are unable to pass urine. You develop a skin rash. You have a fever. You have redness around your IV site that gets worse. Get help right away if: You have difficulty breathing. You have chest pain. You have blood in your urine or stool, or you vomit blood. Summary After the procedure, it is common to have a sore throat or nausea. It is also common to feel tired. Have a responsible adult stay with you for the time you are told. It is important to have someone help care for you until you are awake and alert. When you feel hungry, start by eating small amounts of foods that are soft and easy to digest (bland), such as toast. Gradually return to your regular diet. Drink enough fluid to keep your urine pale yellow. Return to your normal activities as told by your health care provider. Ask your health care provider what activities are safe for you. This information is not intended to  replace advice given to you by your health care provider. Make sure you discuss any questions you have with your health care provider. Document Revised: 10/22/2019 Document Reviewed: 05/21/2019 Elsevier Patient Education  2022 [place]. How to Use Chlorhexidine for Bathing Chlorhexidine gluconate (CHG) is a germ-killing (antiseptic) solution that is used to clean the skin. It can get rid of the bacteria that normally live on the skin and can keep them away for about 24 hours. To clean your skin with CHG, you may be given: A CHG solution to use in the shower or as part of a sponge bath. A prepackaged cloth that contains CHG.  Cleaning your skin with CHG may help lower the risk for infection: While you are staying in the intensive care unit of the hospital. If you have a vascular access, such as a central line, to provide short-term or long-term access to your veins. If you have a catheter to drain urine from your bladder. If you are on a ventilator. A ventilator is a machine that helps you breathe by moving air in and out of your lungs. After surgery. What are the risks? Risks of using CHG include: A skin reaction. Hearing loss, if CHG gets in your ears and you have a perforated eardrum. Eye injury, if CHG gets in your eyes and is not rinsed out. The CHG product catching fire. Make sure that you avoid smoking and flames after applying CHG to your skin. Do not use CHG: If you have a chlorhexidine allergy or have previously reacted to chlorhexidine. On babies younger than 73 months of age. How to use CHG solution Use CHG only as told by your health care provider, and follow the instructions on the label. Use the full amount of CHG as directed. Usually, this is one bottle. During a shower Follow these steps when using CHG solution during a shower (unless your health care provider gives you different instructions): Start the shower. Use your normal soap and shampoo to wash your face and  hair. Turn off the shower or move out of the shower stream. Pour the CHG onto a clean washcloth. Do not use any type of brush or rough-edged sponge. Starting at your neck, lather your body down to your toes. Make sure you follow these instructions: If you will be having surgery, pay special attention to the part of your body where you will be having surgery. Scrub this area for at least 1 minute. Do not use CHG on your head or face. If the solution gets into your ears or eyes, rinse them well with water. Avoid your genital area. Avoid any areas of skin that have broken skin, cuts, or scrapes. Scrub your back and under your arms. Make sure to wash skin folds. Let the lather sit on your skin for 1-2 minutes or as long as told by your health care provider. Thoroughly rinse your entire body in the shower. Make sure that all body creases and crevices are rinsed well. Dry off with a clean towel. Do not put any substances on your body afterward--such as powder, lotion, or perfume--unless you are told to do so by your health care provider. Only use lotions that are recommended by the manufacturer. Put on clean clothes or pajamas. If it is the night before your surgery, sleep in clean sheets.  During a sponge bath Follow these steps when using CHG solution during a sponge bath (unless your health care provider gives you different instructions): Use your normal soap and shampoo to wash your face and hair. Pour the CHG onto a clean washcloth. Starting at your neck, lather your body down to your toes. Make sure you follow these instructions: If you will be having surgery, pay special attention to the part of your body where you will be having surgery. Scrub this area for at least 1 minute. Do not use CHG on your head or face. If the solution gets into your ears or eyes, rinse them well with water. Avoid your genital area. Avoid any areas of skin that have broken skin, cuts, or scrapes. Scrub your back  and under your arms. Make sure to wash  skin folds. Let the lather sit on your skin for 1-2 minutes or as long as told by your health care provider. Using a different clean, wet washcloth, thoroughly rinse your entire body. Make sure that all body creases and crevices are rinsed well. Dry off with a clean towel. Do not put any substances on your body afterward--such as powder, lotion, or perfume--unless you are told to do so by your health care provider. Only use lotions that are recommended by the manufacturer. Put on clean clothes or pajamas. If it is the night before your surgery, sleep in clean sheets. How to use CHG prepackaged cloths Only use CHG cloths as told by your health care provider, and follow the instructions on the label. Use the CHG cloth on clean, dry skin. Do not use the CHG cloth on your head or face unless your health care provider tells you to. When washing with the CHG cloth: Avoid your genital area. Avoid any areas of skin that have broken skin, cuts, or scrapes. Before surgery Follow these steps when using a CHG cloth to clean before surgery (unless your health care provider gives you different instructions): Using the CHG cloth, vigorously scrub the part of your body where you will be having surgery. Scrub using a back-and-forth motion for 3 minutes. The area on your body should be completely wet with CHG when you are done scrubbing. Do not rinse. Discard the cloth and let the area air-dry. Do not put any substances on the area afterward, such as powder, lotion, or perfume. Put on clean clothes or pajamas. If it is the night before your surgery, sleep in clean sheets.  For general bathing Follow these steps when using CHG cloths for general bathing (unless your health care provider gives you different instructions). Use a separate CHG cloth for each area of your body. Make sure you wash between any folds of skin and between your fingers and toes. Wash your body in the  following order, switching to a new cloth after each step: The front of your neck, shoulders, and chest. Both of your arms, under your arms, and your hands. Your stomach and groin area, avoiding the genitals. Your right leg and foot. Your left leg and foot. The back of your neck, your back, and your buttocks. Do not rinse. Discard the cloth and let the area air-dry. Do not put any substances on your body afterward--such as powder, lotion, or perfume--unless you are told to do so by your health care provider. Only use lotions that are recommended by the manufacturer. Put on clean clothes or pajamas. Contact a health care provider if: Your skin gets irritated after scrubbing. You have questions about using your solution or cloth. You swallow any chlorhexidine. Call your local poison control center (1-619-855-3898 in the [place] Get help right away if: Your eyes itch badly, or they become very red or swollen. Your skin itches badly and is red or swollen. Your hearing changes. You have trouble seeing. You have swelling or tingling in your mouth or throat. You have trouble breathing. These symptoms may represent a serious problem that is an emergency. Do not wait to see if the symptoms will go away. Get medical help right away. Call your local emergency services (911 in the [place] Do not drive yourself to the hospital. Summary Chlorhexidine gluconate (CHG) is a germ-killing (antiseptic) solution that is used to clean the skin. Cleaning your skin with CHG may help to lower your risk for infection. You may  be given CHG to use for bathing. It may be in a bottle or in a prepackaged cloth to use on your skin. Carefully follow your health care provider's instructions and the instructions on the product label. Do not use CHG if you have a chlorhexidine allergy. Contact your health care provider if your skin gets irritated after scrubbing. This information is not intended to replace advice given to you  by your health care provider. Make sure you discuss any questions you have with your health care provider. Document Revised: 04/18/2020 Document Reviewed: 04/18/2020 Elsevier Patient Education  2022 [employer].

## 2021-03-22 ENCOUNTER — Encounter (HOSPITAL_COMMUNITY): Payer: Self-pay

## 2021-03-22 ENCOUNTER — Other Ambulatory Visit: Payer: Self-pay

## 2021-03-22 ENCOUNTER — Encounter (HOSPITAL_COMMUNITY)
Admission: RE | Admit: 2021-03-22 | Discharge: 2021-03-22 | Disposition: A | Discharge status: Home / Self Care | Payer: 59 | Source: Ambulatory Visit | Attending: General Surgery | Admitting: General Surgery

## 2021-03-22 VITALS — BP 122/86 | HR 79 | Temp 97.3°F | Resp 18 | Ht 69.05 in | Wt 289.0 lb

## 2021-03-22 DIAGNOSIS — Z01812 Encounter for preprocedural laboratory examination: Secondary | ICD-10-CM | POA: Diagnosis present

## 2021-03-22 DIAGNOSIS — E876 Hypokalemia: Secondary | ICD-10-CM | POA: Insufficient documentation

## 2021-03-22 DIAGNOSIS — Z01818 Encounter for other preprocedural examination: Secondary | ICD-10-CM

## 2021-03-22 HISTORY — DX: Other specified postprocedural states: Z98.890

## 2021-03-22 HISTORY — DX: Other specified postprocedural states: R11.2

## 2021-03-22 LAB — BASIC METABOLIC PANEL
Anion gap: 6 (ref 5–15)
BUN: 19 mg/dL (ref 6–20)
CO2: 26 mmol/L (ref 22–32)
Calcium: 9 mg/dL (ref 8.9–10.3)
Chloride: 104 mmol/L (ref 98–111)
Creatinine, Ser: 0.87 mg/dL (ref 0.44–1.00)
GFR, Estimated: >60 mL/min (ref >60)
Glucose, Bld: 112 mg/dL — ABNORMAL HIGH (ref 70–99)
Potassium: 3.4 mmol/L — ABNORMAL LOW (ref 3.5–5.1)
Sodium: 136 mmol/L (ref 135–145)

## 2021-03-22 LAB — HCG, SERUM, QUALITATIVE: Preg, Serum: NEGATIVE

## 2021-03-24 ENCOUNTER — Ambulatory Visit (HOSPITAL_COMMUNITY): Payer: 59 | Admitting: Anesthesiology

## 2021-03-24 ENCOUNTER — Encounter (HOSPITAL_COMMUNITY)
Admission: RE | Disposition: A | Discharge status: Home / Self Care | Payer: Self-pay | Source: Home / Self Care | Attending: General Surgery

## 2021-03-24 ENCOUNTER — Encounter (HOSPITAL_COMMUNITY): Payer: Self-pay | Admitting: General Surgery

## 2021-03-24 ENCOUNTER — Ambulatory Visit (HOSPITAL_COMMUNITY)
Admission: RE | Admit: 2021-03-24 | Discharge: 2021-03-24 | Disposition: A | Discharge status: Home / Self Care | Payer: 59 | Attending: General Surgery | Admitting: General Surgery

## 2021-03-24 DIAGNOSIS — K432 Incisional hernia without obstruction or gangrene: Secondary | ICD-10-CM | POA: Diagnosis not present

## 2021-03-24 DIAGNOSIS — F17210 Nicotine dependence, cigarettes, uncomplicated: Secondary | ICD-10-CM | POA: Diagnosis not present

## 2021-03-24 DIAGNOSIS — I10 Essential (primary) hypertension: Secondary | ICD-10-CM | POA: Diagnosis not present

## 2021-03-24 DIAGNOSIS — M1990 Unspecified osteoarthritis, unspecified site: Secondary | ICD-10-CM | POA: Insufficient documentation

## 2021-03-24 DIAGNOSIS — Z6841 Body Mass Index (BMI) 40.0 and over, adult: Secondary | ICD-10-CM | POA: Diagnosis not present

## 2021-03-24 DIAGNOSIS — F418 Other specified anxiety disorders: Secondary | ICD-10-CM | POA: Insufficient documentation

## 2021-03-24 HISTORY — PX: INCISIONAL HERNIA REPAIR: SHX193

## 2021-03-24 SURGERY — REPAIR, HERNIA, INCISIONAL, LAPAROSCOPIC
Anesthesia: General | Site: Abdomen

## 2021-03-24 MED ORDER — MIDAZOLAM HCL 2 MG/2ML IJ SOLN
INTRAMUSCULAR | Status: AC
  Filled 2021-03-24: qty 2

## 2021-03-24 MED ORDER — FENTANYL CITRATE (PF) 250 MCG/5ML IJ SOLN
INTRAMUSCULAR | Status: DC | PRN
Start: 2021-03-24 — End: 2021-03-24
  Administered 2021-03-24 (×3): 50 ug via INTRAVENOUS
  Administered 2021-03-24 (×2): 100 ug via INTRAVENOUS

## 2021-03-24 MED ORDER — CEFAZOLIN IN SODIUM CHLORIDE 3-0.9 GM/100ML-% IV SOLN
3.0000 g | INTRAVENOUS | Status: AC
  Administered 2021-03-24: 3 g via INTRAVENOUS
  Filled 2021-03-24: qty 100

## 2021-03-24 MED ORDER — DEXAMETHASONE SODIUM PHOSPHATE 10 MG/ML IJ SOLN
INTRAMUSCULAR | Status: AC
  Filled 2021-03-24: qty 1

## 2021-03-24 MED ORDER — OXYCODONE-ACETAMINOPHEN 5-325 MG PO TABS
ORAL_TABLET | ORAL | Status: AC
  Filled 2021-03-24: qty 2

## 2021-03-24 MED ORDER — KETOROLAC TROMETHAMINE 30 MG/ML IJ SOLN
30.0000 mg | Freq: Once | INTRAMUSCULAR | Status: AC
  Administered 2021-03-24: 30 mg via INTRAVENOUS
  Filled 2021-03-24: qty 1

## 2021-03-24 MED ORDER — ONDANSETRON HCL 4 MG/2ML IJ SOLN: 4.0000 mg | Freq: Once | INTRAMUSCULAR | Status: DC | PRN

## 2021-03-24 MED ORDER — SUGAMMADEX SODIUM 500 MG/5ML IV SOLN
INTRAVENOUS | Status: AC
  Filled 2021-03-24: qty 5

## 2021-03-24 MED ORDER — ONDANSETRON HCL 4 MG/2ML IJ SOLN
INTRAMUSCULAR | Status: DC | PRN
  Administered 2021-03-24: 4 mg via INTRAVENOUS

## 2021-03-24 MED ORDER — DEXAMETHASONE SODIUM PHOSPHATE 10 MG/ML IJ SOLN
INTRAMUSCULAR | Status: DC | PRN
Start: 2021-03-24 — End: 2021-03-24
  Administered 2021-03-24: 10 mg via INTRAVENOUS

## 2021-03-24 MED ORDER — MEPERIDINE HCL 50 MG/ML IJ SOLN: 6.2500 mg | INTRAMUSCULAR | Status: DC | PRN

## 2021-03-24 MED ORDER — CHLORHEXIDINE GLUCONATE CLOTH 2 % EX PADS: 6.0000 | MEDICATED_PAD | Freq: Once | CUTANEOUS | Status: DC

## 2021-03-24 MED ORDER — ROCURONIUM BROMIDE 10 MG/ML (PF) SYRINGE
PREFILLED_SYRINGE | INTRAVENOUS | Status: AC
  Filled 2021-03-24: qty 10

## 2021-03-24 MED ORDER — SUGAMMADEX SODIUM 200 MG/2ML IV SOLN
INTRAVENOUS | Status: DC | PRN
  Administered 2021-03-24: 300 mg via INTRAVENOUS

## 2021-03-24 MED ORDER — MIDAZOLAM HCL 5 MG/5ML IJ SOLN
INTRAMUSCULAR | Status: DC | PRN
Start: 2021-03-24 — End: 2021-03-24
  Administered 2021-03-24: 2 mg via INTRAVENOUS

## 2021-03-24 MED ORDER — ONDANSETRON HCL 4 MG/2ML IJ SOLN
INTRAMUSCULAR | Status: AC
  Filled 2021-03-24: qty 2

## 2021-03-24 MED ORDER — BUPIVACAINE LIPOSOME 1.3 % IJ SUSP
INTRAMUSCULAR | Status: AC
  Filled 2021-03-24: qty 20

## 2021-03-24 MED ORDER — CHLORHEXIDINE GLUCONATE 0.12 % MT SOLN
15.0000 mL | Freq: Once | OROMUCOSAL | Status: AC
  Administered 2021-03-24: 15 mL via OROMUCOSAL

## 2021-03-24 MED ORDER — LACTATED RINGERS IV SOLN
INTRAVENOUS | Status: DC
  Administered 2021-03-24: 1000 mL via INTRAVENOUS

## 2021-03-24 MED ORDER — LIDOCAINE HCL (PF) 2 % IJ SOLN
INTRAMUSCULAR | Status: AC
  Filled 2021-03-24: qty 5

## 2021-03-24 MED ORDER — PROPOFOL 10 MG/ML IV BOLUS
INTRAVENOUS | Status: AC
  Filled 2021-03-24: qty 20

## 2021-03-24 MED ORDER — OXYCODONE-ACETAMINOPHEN 5-325 MG PO TABS: 1.0000 | ORAL_TABLET | ORAL | 0 refills | Status: DC | PRN

## 2021-03-24 MED ORDER — LIDOCAINE 2% (20 MG/ML) 5 ML SYRINGE
INTRAMUSCULAR | Status: DC | PRN
  Administered 2021-03-24: 80 mg via INTRAVENOUS

## 2021-03-24 MED ORDER — MIDAZOLAM HCL 2 MG/2ML IJ SOLN
2.0000 mg | Freq: Once | INTRAMUSCULAR | Status: AC
  Administered 2021-03-24: 2 mg via INTRAVENOUS

## 2021-03-24 MED ORDER — ORAL CARE MOUTH RINSE: 15.0000 mL | Freq: Once | OROMUCOSAL | Status: AC

## 2021-03-24 MED ORDER — 0.9 % SODIUM CHLORIDE (POUR BTL) OPTIME
TOPICAL | Status: DC | PRN
  Administered 2021-03-24: 1000 mL

## 2021-03-24 MED ORDER — PROPOFOL 10 MG/ML IV BOLUS
INTRAVENOUS | Status: DC | PRN
  Administered 2021-03-24: 200 mg via INTRAVENOUS

## 2021-03-24 MED ORDER — DEXMEDETOMIDINE (PRECEDEX) IN NS 20 MCG/5ML (4 MCG/ML) IV SYRINGE
PREFILLED_SYRINGE | INTRAVENOUS | Status: DC | PRN
  Administered 2021-03-24: 12 ug via INTRAVENOUS

## 2021-03-24 MED ORDER — ROCURONIUM BROMIDE 10 MG/ML (PF) SYRINGE
PREFILLED_SYRINGE | INTRAVENOUS | Status: DC | PRN
Start: 2021-03-24 — End: 2021-03-24
  Administered 2021-03-24: 60 mg via INTRAVENOUS
  Administered 2021-03-24 (×2): 20 mg via INTRAVENOUS

## 2021-03-24 MED ORDER — FENTANYL CITRATE (PF) 250 MCG/5ML IJ SOLN
INTRAMUSCULAR | Status: AC
  Filled 2021-03-24: qty 5

## 2021-03-24 MED ORDER — HEMOSTATIC AGENTS (NO CHARGE) OPTIME
TOPICAL | Status: DC | PRN
  Administered 2021-03-24: 1 via TOPICAL

## 2021-03-24 MED ORDER — ESMOLOL HCL 100 MG/10ML IV SOLN
INTRAVENOUS | Status: AC
  Filled 2021-03-24: qty 10

## 2021-03-24 MED ORDER — BUPIVACAINE LIPOSOME 1.3 % IJ SUSP
INTRAMUSCULAR | Status: DC | PRN
  Administered 2021-03-24: 20 mL

## 2021-03-24 MED ORDER — FENTANYL CITRATE (PF) 100 MCG/2ML IJ SOLN
INTRAMUSCULAR | Status: AC
  Filled 2021-03-24: qty 2

## 2021-03-24 MED ORDER — OXYCODONE-ACETAMINOPHEN 5-325 MG PO TABS
2.0000 | ORAL_TABLET | Freq: Once | ORAL | Status: AC
  Administered 2021-03-24: 2 via ORAL

## 2021-03-24 MED ORDER — HYDROMORPHONE HCL 1 MG/ML IJ SOLN
0.2500 mg | INTRAMUSCULAR | Status: AC | PRN
  Administered 2021-03-24 (×4): 0.5 mg via INTRAVENOUS
  Filled 2021-03-24 (×4): qty 0.5

## 2021-03-24 MED ORDER — ONDANSETRON HCL 4 MG PO TABS: 4.0000 mg | ORAL_TABLET | Freq: Three times a day (TID) | ORAL | 0 refills | Status: DC | PRN

## 2021-03-24 SURGICAL SUPPLY — 46 items
ADH SKN CLS APL DERMABOND .7 (GAUZE/BANDAGES/DRESSINGS) ×1
APL PRP STRL LF DISP 70% ISPRP (MISCELLANEOUS) ×1
APL SRG 38 LTWT LNG FL B (MISCELLANEOUS) ×1
APPLICATOR ARISTA FLEXITIP XL (MISCELLANEOUS) ×1 IMPLANT
BLADE SURG SZ11 CARB STEEL (BLADE) ×3 IMPLANT
CHLORAPREP W/TINT 26 (MISCELLANEOUS) ×3 IMPLANT
CLOTH BEACON ORANGE TIMEOUT ST (SAFETY) ×3 IMPLANT
COVER LIGHT HANDLE STERIS (MISCELLANEOUS) ×6 IMPLANT
DERMABOND ADVANCED (GAUZE/BANDAGES/DRESSINGS) ×1
DERMABOND ADVANCED .7 DNX12 (GAUZE/BANDAGES/DRESSINGS) ×2 IMPLANT
ELECT REM PT RETURN 9FT ADLT (ELECTROSURGICAL) ×2
ELECTRODE REM PT RTRN 9FT ADLT (ELECTROSURGICAL) ×2 IMPLANT
GLOVE SURG POLYISO LF SZ7.5 (GLOVE) ×3 IMPLANT
GLOVE SURG UNDER POLY LF SZ7 (GLOVE) ×6 IMPLANT
GOWN STRL REUS W/TWL LRG LVL3 (GOWN DISPOSABLE) ×9 IMPLANT
GRASPER SUT TROCAR 14GX15 (MISCELLANEOUS) ×3 IMPLANT
HEMOSTAT ARISTA ABSORB 1G (HEMOSTASIS) ×1 IMPLANT
INST SET LAPROSCOPIC AP (KITS) ×3 IMPLANT
IV NS IRRIG 3000ML ARTHROMATIC (IV SOLUTION) IMPLANT
KIT TURNOVER KIT A (KITS) ×3 IMPLANT
LIGASURE LAP ATLAS 10MM 37CM (INSTRUMENTS) ×3 IMPLANT
MANIFOLD NEPTUNE II (INSTRUMENTS) ×3 IMPLANT
MESH VENTRALIGHT ST 6X8 (Mesh Specialty) ×2 IMPLANT
MESH VENTRLGHT ELLIPSE 8X6XMFL (Mesh Specialty) IMPLANT
NDL HYPO 18GX1.5 BLUNT FILL (NEEDLE) ×2 IMPLANT
NDL HYPO 21X1.5 SAFETY (NEEDLE) ×2 IMPLANT
NDL INSUFFLATION 14GA 120MM (NEEDLE) ×2 IMPLANT
NEEDLE HYPO 18GX1.5 BLUNT FILL (NEEDLE) ×2 IMPLANT
NEEDLE HYPO 21X1.5 SAFETY (NEEDLE) ×2 IMPLANT
NEEDLE INSUFFLATION 14GA 120MM (NEEDLE) ×2 IMPLANT
NS IRRIG 1000ML POUR BTL (IV SOLUTION) ×3 IMPLANT
PACK LAP CHOLE LZT030E (CUSTOM PROCEDURE TRAY) ×3 IMPLANT
PAD ARMBOARD 7.5X6 YLW CONV (MISCELLANEOUS) ×3 IMPLANT
SET BASIN LINEN APH (SET/KITS/TRAYS/PACK) ×3 IMPLANT
SET TUBE IRRIG SUCTION NO TIP (IRRIGATION / IRRIGATOR) IMPLANT
SET TUBE SMOKE EVAC HIGH FLOW (TUBING) ×3 IMPLANT
SUT MNCRL AB 4-0 PS2 18 (SUTURE) ×6 IMPLANT
SUT PROLENE 0 CT 1 CR/8 (SUTURE) ×3 IMPLANT
SUT VICRYL 0 UR6 27IN ABS (SUTURE) IMPLANT
SYR 20ML LL LF (SYRINGE) ×6 IMPLANT
TACKER 5MM HERNIA 3.5CML NAB (ENDOMECHANICALS) ×4 IMPLANT
TRAY FOLEY W/BAG SLVR 16FR (SET/KITS/TRAYS/PACK) ×2
TRAY FOLEY W/BAG SLVR 16FR ST (SET/KITS/TRAYS/PACK) ×2 IMPLANT
TROCAR ENDO BLADELESS 11MM (ENDOMECHANICALS) ×3 IMPLANT
TROCAR XCEL UNIV SLVE 11M 100M (ENDOMECHANICALS) ×9 IMPLANT
WARMER LAPAROSCOPE (MISCELLANEOUS) ×3 IMPLANT

## 2021-03-24 NOTE — Anesthesia Postprocedure Evaluation (Signed)
Anesthesia Post Note  Patient: Carol Sharp  Procedure(s) Performed: LAPAROSCOPIC INCISIONAL HERNIA W/MESH (Abdomen)  Patient location during evaluation: Phase II Anesthesia Type: General Level of consciousness: awake and alert Pain management: pain level controlled Vital Signs Assessment: post-procedure vital signs reviewed and stable Respiratory status: spontaneous breathing, nonlabored ventilation and respiratory function stable Cardiovascular status: blood pressure returned to baseline and stable Postop Assessment: no apparent nausea or vomiting Anesthetic complications: no   No notable events documented.   Last Vitals:  Vitals:   03/24/21 1155 03/24/21 1214  BP: (!) 152/92   Pulse: 77   Resp: 18   Temp: 36.6 C   SpO2: 91% 94%    Last Pain:  Vitals:   03/24/21 1155  TempSrc: Oral  PainSc: 5                  An Schnabel C Alizey Noren

## 2021-03-24 NOTE — Op Note (Signed)
Patient:  Carol Sharp  DOB:  04/24/1975  MRN:  [id number]   Preop Diagnosis:  Incisional hernia   Postop Diagnosis: Same  Procedure: Laparoscopic incisional herniorrhaphy with mesh  Surgeon: Aviva Signs, MD  Anes: General endotracheal  Indications: Patient is a 46 year old white female status post a laparoscopic procedure in the past who now presents with a reducible supraumbilical hernia.  The risks and benefits of the procedure including bleeding, infection, mesh use, intra-abdominal organ injury, and the possibility of recurrence of the hernia were fully explained to the patient, who gave informed consent.  Procedure note: The patient was placed in the supine position.  After induction of general endotracheal anesthesia, the abdomen was prepped and draped using the usual sterile technique with ChloraPrep.  Surgical site confirmation was performed.  Initially, I attempted a Veress needle introduction, but always failed the saline drop test.  A left upper quadrant incision was made and under direct visualization, an 11 millimeter port was placed into the abdominal cavity without difficulty.  The abdomen was then insufflated to 15 mmHg pressure.  An additional 1 mm trocar was placed in the right upper quadrant and left lower quadrant regions.  The liver was inspected and a small perforation was noted.  This was cauterized using Bovie electrocautery.  Arista was also placed over this area at the end of the procedure.  On inspection of the ventral wall, the patient had an ovoid defect which measured approximately 6 cm in its greatest diameter longitudinally.  It was elected to proceed with a 15 x 20 cm Bard Ventralight DualMesh.  It was secured to place in 4 quadrants using a 2-0 Prolene suture.  A protractor was then used circumferentially and a 2 layer fashion in order to secure the mesh to the abdominal wall.  More than a 3 cm overlay around the edges of the hernia defect was found on final  inspection of the mesh.  The liver was again inspected and no abnormal bleeding was noted.  No apparent bowel injury was present.  All fluid and air were then evacuated from the abdominal cavity prior to the removal of the trochars.  All wounds were irrigated with normal saline.  All wounds were injected with Exparel.  All incisions were closed using a 4-0 Monocryl subcuticular suture.  Dermabond was applied.  All tape and needle counts were correct at the end the procedure.  The patient was extubated in the operating room and transferred to PACU in stable condition.  Complications: None  EBL: Minimal  Specimen: None

## 2021-03-24 NOTE — Anesthesia Procedure Notes (Signed)
Procedure Name: Intubation Date/Time: 03/24/2021 9:23 AM Performed by: Myna Bright, CRNA Pre-anesthesia Checklist: Patient identified, Emergency Drugs available, Suction available and Patient being monitored Patient Re-evaluated:Patient Re-evaluated prior to induction Oxygen Delivery Method: Circle system utilized Preoxygenation: Pre-oxygenation with 100% oxygen Induction Type: IV induction Ventilation: Mask ventilation without difficulty and Oral airway inserted - appropriate to patient size Laryngoscope Size: Mac and 3 Grade View: Grade III Tube type: Oral Tube size: 7.0 mm Number of attempts: 2 Airway Equipment and Method: Bougie stylet Placement Confirmation: ETT inserted through vocal cords under direct vision, positive ETCO2 and breath sounds checked- equal and bilateral Secured at: 21 cm Tube secured with: Tape Dental Injury: Teeth and Oropharynx as per pre-operative assessment  Difficulty Due To: Difficult Airway- due to anterior larynx and Difficulty was unanticipated Future Recommendations: Recommend- induction with short-acting agent, and alternative techniques readily available Comments: Easy mask airway. DL x 1 with MAC 3, view of epiglottis only. Resumed mask ventilation. DL x 2 with MAC 3, bougie advanced easily, tracheal rings appreciated, ETT advanced without difficulty over bougie. +EtCO2, +BBS. Atraumatic. VSS. Recommend glidescope.

## 2021-03-24 NOTE — Progress Notes (Signed)
Patient demonstrated use of incentive spirometer in phase. Sent home with patient.

## 2021-03-24 NOTE — Interval H&P Note (Signed)
History and Physical Interval Note:  03/24/2021 8:35 AM  Carol Sharp  has presented today for surgery, with the diagnosis of Incisional hernia.  The various methods of treatment have been discussed with the patient and family. After consideration of risks, benefits and other options for treatment, the patient has consented to  Procedure(s): LAPAROSCOPIC INCISIONAL HERNIA W/MESH (N/A) as a surgical intervention.  The patient's history has been reviewed, patient examined, no change in status, stable for surgery.  I have reviewed the patient's chart and labs.  Questions were answered to the patient's satisfaction.     Aviva Signs

## 2021-03-24 NOTE — Anesthesia Preprocedure Evaluation (Signed)
Anesthesia Evaluation  Patient identified by MRN, date of birth, ID band Patient awake    Reviewed: Allergy & Precautions, NPO status , Patient's Chart, lab work & pertinent test results  History of Anesthesia Complications (+) PONV and history of anesthetic complications  Airway Mallampati: II  TM Distance: >3 FB Neck ROM: Full    Dental  (+) Dental Advisory Given, Teeth Intact   Pulmonary Current Smoker and Patient abstained from smoking.,    Pulmonary exam normal breath sounds clear to auscultation       Cardiovascular Exercise Tolerance: Good hypertension, Pt. on medications Normal cardiovascular exam Rhythm:Regular Rate:Normal     Neuro/Psych PSYCHIATRIC DISORDERS Anxiety Depression    GI/Hepatic negative GI ROS, Neg liver ROS,   Endo/Other  Morbid obesity  Renal/GU negative Renal ROS     Musculoskeletal  (+) Arthritis , Osteoarthritis,    Abdominal   Peds  Hematology negative hematology ROS (+)   Anesthesia Other Findings   Reproductive/Obstetrics                             Anesthesia Physical Anesthesia Plan  ASA: 3  Anesthesia Plan: General   Post-op Pain Management: Dilaudid IV   Induction: Intravenous  PONV Risk Score and Plan: 4 or greater and Ondansetron, Dexamethasone and Midazolam  Airway Management Planned: Oral ETT  Additional Equipment:   Intra-op Plan:   Post-operative Plan: Extubation in OR  Informed Consent: I have reviewed the patients History and Physical, chart, labs and discussed the procedure including the risks, benefits and alternatives for the proposed anesthesia with the patient or authorized representative who has indicated his/her understanding and acceptance.     Dental advisory given  Plan Discussed with: CRNA and Surgeon  Anesthesia Plan Comments:         Anesthesia Quick Evaluation

## 2021-03-24 NOTE — Transfer of Care (Signed)
Immediate Anesthesia Transfer of Care Note  Patient: Carol Sharp  Procedure(s) Performed: LAPAROSCOPIC INCISIONAL HERNIA W/MESH (Abdomen)  Patient Location: PACU  Anesthesia Type:General  Level of Consciousness: sedated, patient cooperative and responds to stimulation  Airway & Oxygen Therapy: Patient Spontanous Breathing and Patient connected to nasal cannula oxygen  Post-op Assessment: Report given to RN, Post -op Vital signs reviewed and stable and Patient moving all extremities  Post vital signs: Reviewed and stable  Last Vitals:  Vitals Value Taken Time  BP 156/110 03/24/21 1052  Temp    Pulse 69 03/24/21 1059  Resp 18 03/24/21 1059  SpO2 92 % 03/24/21 1059  Vitals shown include unvalidated device data.  Last Pain:  Vitals:   03/24/21 0750  TempSrc: Oral  PainSc: 7       Patients Stated Pain Goal: 8 (13/68/59 9234)  Complications: No notable events documented.

## 2021-03-27 ENCOUNTER — Encounter (HOSPITAL_COMMUNITY): Payer: Self-pay | Admitting: General Surgery

## 2021-04-06 ENCOUNTER — Encounter: Payer: Self-pay | Admitting: General Surgery

## 2021-04-06 ENCOUNTER — Other Ambulatory Visit: Payer: Self-pay

## 2021-04-06 ENCOUNTER — Ambulatory Visit (INDEPENDENT_AMBULATORY_CARE_PROVIDER_SITE_OTHER): Payer: PRIVATE HEALTH INSURANCE | Admitting: General Surgery

## 2021-04-06 VITALS — BP 145/97 | HR 79 | Temp 98.6°F | Resp 16 | Ht 69.0 in | Wt 282.0 lb

## 2021-04-06 DIAGNOSIS — Z09 Encounter for follow-up examination after completed treatment for conditions other than malignant neoplasm: Secondary | ICD-10-CM

## 2021-04-06 MED ORDER — OXYCODONE-ACETAMINOPHEN 5-325 MG PO TABS: 1.0000 | ORAL_TABLET | ORAL | 0 refills | Status: DC | PRN

## 2021-04-06 NOTE — Progress Notes (Signed)
Subjective:     Carol Sharp  Here for postoperative visit, status post laparoscopic ventral herniorrhaphy with mesh.  Patient states she has had a significant amount of pain especially along the right side of her abdomen.  Is made it worse with movement.  She states that it is starting to ease.  She denies any nausea or vomiting.  She is starting to increase her activity.  She denies any fever or chills. Objective:    BP (!) 145/97    Pulse 79    Temp 98.6 F (37 C) (Other (Comment))    Resp 16    Ht 5\' 9"  (1.753 m)    Wt 282 lb (127.9 kg)    SpO2 93%    BMI 41.64 kg/m   General:  alert, cooperative, and no distress  Abdomen is soft, incisions healing well.     Assessment:    Doing well postoperatively. Her postoperative pain is not unexpected    Plan:   I told her to increase her activity as able but to avoid any straining that could aggravate her abdominal wall.  I did reorder her Percocet for pain.  I will see her again in 3 weeks for follow-up.  I reassured the patient that she was healing appropriately.

## 2021-04-13 LAB — LIPID PANEL
Chol/HDL Ratio: 4.7 ratio — ABNORMAL HIGH (ref 0.0–4.4)
Cholesterol, Total: 260 mg/dL — ABNORMAL HIGH (ref 100–199)
HDL: 55 mg/dL (ref >39)
LDL Chol Calc (NIH): 161 mg/dL — ABNORMAL HIGH (ref 0–99)
Triglycerides: 239 mg/dL — ABNORMAL HIGH (ref 0–149)
VLDL Cholesterol Cal: 44 mg/dL — ABNORMAL HIGH (ref 5–40)

## 2021-04-14 ENCOUNTER — Encounter: Payer: Self-pay | Admitting: Family Medicine

## 2021-04-15 ENCOUNTER — Other Ambulatory Visit: Payer: Self-pay | Admitting: Family Medicine

## 2021-04-18 MED ORDER — ROSUVASTATIN CALCIUM 10 MG PO TABS: 10.0000 mg | ORAL_TABLET | Freq: Every day | ORAL | 0 refills | Status: DC

## 2021-04-26 ENCOUNTER — Encounter: Payer: 59 | Admitting: General Surgery

## 2021-05-06 ENCOUNTER — Other Ambulatory Visit: Payer: Self-pay | Admitting: Family Medicine

## 2021-05-09 ENCOUNTER — Telehealth: Payer: Self-pay

## 2021-05-09 NOTE — Telephone Encounter (Signed)
Patient states she wants the Xanax '1mg'$  tablet for bedtime- she states the 0.'5mg'$  does not help her sleep ?

## 2021-05-09 NOTE — Telephone Encounter (Signed)
Pt called in wanting provider to know that she would like to stop taking this med zolpidem (AMBIEN) 5 MG tablet. Pt states that she would like to start back taking this med ALPRAZolam (XANAX) 0.5 MG tablet. Please advise. ? ?Cb#: [phone] ? ? ?

## 2021-05-09 NOTE — Telephone Encounter (Signed)
1.  I 100% support her coming off of the Ambien ?So issue proposing to take just 1 Xanax 0.5 mg each evening as needed? (Or is she proposing to take Xanax several times per day?) ?

## 2021-05-10 ENCOUNTER — Other Ambulatory Visit: Payer: Self-pay | Admitting: Family Medicine

## 2021-05-10 MED ORDER — ALPRAZOLAM 1 MG PO TABS: ORAL_TABLET | ORAL | 1 refills | Status: DC

## 2021-05-10 NOTE — Telephone Encounter (Signed)
Discontinue Ambien ?Prescription for Xanax 1 mg 1 nightly as needed insomnia caution drowsiness was sent to [facility] ?Patient to give [place] feedback within 3 to 4 weeks how this is doing for her ? ?

## 2021-05-10 NOTE — Telephone Encounter (Signed)
Patient informed of md message and recommendations. Verbalized understanding. 

## 2021-05-11 ENCOUNTER — Telehealth: Payer: Self-pay

## 2021-05-11 ENCOUNTER — Other Ambulatory Visit: Payer: Self-pay | Admitting: Family Medicine

## 2021-05-11 MED ORDER — PREDNISONE 10 MG PO TABS: ORAL_TABLET | ORAL | 0 refills | Status: DC

## 2021-05-11 NOTE — Telephone Encounter (Signed)
Pt called in requesting to get a refill of a prednisone patch. Pt did not know the name of the med. Please advise. ? ?Cb#: [phone] ?

## 2021-05-11 NOTE — Telephone Encounter (Signed)
Patient states she hurt her back helping her husband move something- requesting prednisone dose pack- Prescription sent to pharmacy by Dr Lacinda Axon- Patient notified and and advised to follow up if ongoing issues ?

## 2021-05-29 ENCOUNTER — Other Ambulatory Visit: Payer: Self-pay | Admitting: Family Medicine

## 2021-05-29 ENCOUNTER — Telehealth: Payer: Self-pay

## 2021-05-29 MED ORDER — PREDNISONE 10 MG PO TABS: ORAL_TABLET | ORAL | 0 refills | Status: DC

## 2021-05-29 NOTE — Telephone Encounter (Signed)
Caller name:[name]  ? ?On DPR? :Yes ? ?Call back [phone] ? ?Provider they see: Wolfgang Phoenix  ? ?Reason for call:Pt is calling wanting to know if she can do a MyChart appt to have prednisone called in for her back she is having back pain or will this have to be a office appt.  ? ?

## 2021-05-29 NOTE — Telephone Encounter (Signed)
A tapering dose was sent to [facility] [address] ?If any ongoing troubles needs to do follow-up office visit ?

## 2021-05-29 NOTE — Telephone Encounter (Signed)
Patient notified and verbalized understanding. 

## 2021-06-17 LAB — LIPID PANEL
Chol/HDL Ratio: 4.4 ratio (ref 0.0–4.4)
Cholesterol, Total: 267 mg/dL — ABNORMAL HIGH (ref 100–199)
HDL: 61 mg/dL (ref >39)
LDL Chol Calc (NIH): 166 mg/dL — ABNORMAL HIGH (ref 0–99)
Triglycerides: 219 mg/dL — ABNORMAL HIGH (ref 0–149)
VLDL Cholesterol Cal: 40 mg/dL (ref 5–40)

## 2021-06-17 LAB — HEPATIC FUNCTION PANEL
ALT: 22 IU/L (ref 0–32)
AST: 16 IU/L (ref 0–40)
Albumin: 4.5 g/dL (ref 3.8–4.8)
Alkaline Phosphatase: 54 IU/L (ref 44–121)
Bilirubin Total: 0.3 mg/dL (ref 0.0–1.2)
Bilirubin, Direct: <0.1 mg/dL (ref 0.00–0.40)
Total Protein: 6.9 g/dL (ref 6.0–8.5)

## 2021-06-26 ENCOUNTER — Other Ambulatory Visit: Payer: Self-pay | Admitting: Family Medicine

## 2021-06-26 DIAGNOSIS — E785 Hyperlipidemia, unspecified: Secondary | ICD-10-CM

## 2021-06-26 DIAGNOSIS — Z79899 Other long term (current) drug therapy: Secondary | ICD-10-CM

## 2021-06-26 MED ORDER — ATORVASTATIN CALCIUM 20 MG PO TABS: 20.0000 mg | ORAL_TABLET | Freq: Every day | ORAL | 1 refills | Status: DC

## 2021-07-06 ENCOUNTER — Encounter: Payer: Self-pay | Admitting: Nurse Practitioner

## 2021-07-06 ENCOUNTER — Ambulatory Visit (INDEPENDENT_AMBULATORY_CARE_PROVIDER_SITE_OTHER): Payer: 59 | Admitting: Nurse Practitioner

## 2021-07-06 VITALS — BP 120/87 | HR 90 | Temp 97.7°F | Ht 69.0 in | Wt 282.8 lb

## 2021-07-06 DIAGNOSIS — M25551 Pain in right hip: Secondary | ICD-10-CM

## 2021-07-06 MED ORDER — TIZANIDINE HCL 2 MG PO CAPS: 2.0000 mg | ORAL_CAPSULE | Freq: Three times a day (TID) | ORAL | 1 refills | Status: DC

## 2021-07-06 MED ORDER — PREDNISONE 10 MG PO TABS: ORAL_TABLET | ORAL | 0 refills | Status: DC

## 2021-07-06 NOTE — Progress Notes (Signed)
Subjective:    Patient ID: Carol Sharp, female    DOB: 12-12-1975, 46 y.o.   MRN: [id number]  HPI  46 year old female with history of hypertension, anxiety, arthritis presents to clinic with right hip pain.  Patient states that she has had right hip pain that comes and goes for the past 6 months.  Patient was seen by PCP and prescribed prednisone which patient states helps.  Patient states that pain is a achy sharp pain that radiates down her right leg.  Patient states that it hurts to lift her legs and hurts to stretch.  Patient states that pain is worse when she gets out of bed and then comes and goes throughout the day.  Patient states that it feels like her hips will give out .  Patient has trialed prednisone which helps.  Patient states that heat pads, ibuprofen, and castor oil helps but does not get rid of pain.    Patient denies any numbness tingling in her and legs.  Patient denies any weakness in her legs.  Patient denies any popping of her joints or difficulty walking fever, body aches, chills.   Review of Systems  Musculoskeletal:        Right hip pain  All other systems reviewed and are negative.     Objective:   Physical Exam Vitals reviewed.  Constitutional:      General: She is not in acute distress.    Appearance: Normal appearance. She is obese. She is not ill-appearing, toxic-appearing or diaphoretic.  HENT:     Head: Normocephalic and atraumatic.  Cardiovascular:     Rate and Rhythm: Normal rate and regular rhythm.     Pulses: Normal pulses.     Heart sounds: Normal heart sounds. No murmur heard. Pulmonary:     Effort: Pulmonary effort is normal. No respiratory distress.     Breath sounds: Normal breath sounds. No wheezing.  Musculoskeletal:        General: Swelling present.     Right hip: Tenderness present. No deformity, lacerations, bony tenderness or crepitus. Normal range of motion. Normal strength.     Left hip: Normal.     Comments: Grossly intact.   Strength 5 out of 5 to bilateral legs and hips  Mild swelling noted to patient's right knee.  Pain noted to patient's right hip with flexion.  Lesser pain noted with abduction or adduction.  Right hip tender to palpation.  No deformity or malformations noted.  No swelling noted  Skin:    General: Skin is warm.     Capillary Refill: Capillary refill takes less than 2 seconds.  Neurological:     Mental Status: She is alert.     Comments: Grossly intact  Psychiatric:        Mood and Affect: Mood normal.        Behavior: Behavior normal.          Assessment & Plan:   1. Right hip pain -Believe pain is muscular fascia. -We will trial a muscle relaxer.  Patient notified that muscle relaxers can be sedating and to not take them when operating heavy machinery -Patient placed on another prednisone taper that will hopefully help manage pain until patient is able to see sports medicine - Ambulatory referral to Sports Medicine -Physical therapy was offered however patient states that she rather be referred directly to sports medicine - tizanidine (ZANAFLEX) 2 MG capsule; Take 1 capsule (2 mg total) by mouth 3 (three) times daily.  Dispense: 30 capsule; Refill: 1 - predniSONE (DELTASONE) 10 MG tablet; 50 mg daily x 2 days, then 40 mg daily x 2 days, then 30 mg daily x 2 days, then 20 mg daily x 2 days, then 10 mg daily x 2 days.  Dispense: 30 tablet; Refill: 0 -Return to clinic if symptoms worsen or do not improve    Note:  This document was prepared using Dragon voice recognition software and may include unintentional dictation errors. Note - This record has been created using [employer].  Chart creation errors have been sought, but may not always  have been located. Such creation errors do not reflect on  the standard of medical care.

## 2021-07-07 ENCOUNTER — Encounter: Payer: Self-pay | Admitting: Nurse Practitioner

## 2021-07-09 ENCOUNTER — Other Ambulatory Visit: Payer: Self-pay | Admitting: Family Medicine

## 2021-07-11 NOTE — Telephone Encounter (Signed)
So this is confusing Previously 3 months ago she wanted to start Xanax 0.5 mg to help her with sleep but then told Autumn that the 0.5 does not help and requested the 1 mg.  At that time in March 2023 we prescribed alprazolam 1 mg.  Now on getting a request for 0.5 mg.  Please clarify with the patient which 1 is she utilizing?  That way we can make sure we are prescribing the correct dosage

## 2021-07-12 ENCOUNTER — Other Ambulatory Visit: Payer: Self-pay | Admitting: *Deleted

## 2021-07-12 NOTE — Telephone Encounter (Signed)
Called [facility] and canceled Xananx 0.5 mg

## 2021-07-12 NOTE — Telephone Encounter (Signed)
1.  Please call [place] [facility] and asked them to cancel the Xanax 0.5 mg request Then May pend the 1 mg request from [facility] and I will sign

## 2021-07-12 NOTE — Telephone Encounter (Signed)
Patient says she is taking Xanax 1 mg. Doesn't understand why [facility] sent over the refill request.  She says she needs a refill on the Xanax 1 mg sent to [facility].  Please advise. Thank you

## 2021-07-13 MED ORDER — ALPRAZOLAM 1 MG PO TABS: ORAL_TABLET | ORAL | 3 refills | Status: DC

## 2021-07-20 ENCOUNTER — Ambulatory Visit (INDEPENDENT_AMBULATORY_CARE_PROVIDER_SITE_OTHER): Payer: 59 | Admitting: Orthopedic Surgery

## 2021-07-20 ENCOUNTER — Ambulatory Visit (INDEPENDENT_AMBULATORY_CARE_PROVIDER_SITE_OTHER): Payer: 59

## 2021-07-20 ENCOUNTER — Encounter: Payer: Self-pay | Admitting: Orthopedic Surgery

## 2021-07-20 VITALS — BP 157/110 | HR 87 | Ht 69.5 in | Wt 284.2 lb

## 2021-07-20 DIAGNOSIS — M25551 Pain in right hip: Secondary | ICD-10-CM

## 2021-07-20 DIAGNOSIS — S73191A Other sprain of right hip, initial encounter: Secondary | ICD-10-CM | POA: Diagnosis not present

## 2021-07-20 NOTE — Patient Instructions (Signed)
While we are working on your approval for MRI please go ahead and call to schedule your appointment with [facility] within at least one (1) week.   Central Scheduling [phone]  

## 2021-07-20 NOTE — Progress Notes (Unsigned)
  Chief Complaint  Patient presents with   Hip Pain    RT hip/ painful ~1 month   Carol Sharp is a 46 year old female presents to [place] with 1 month history of right hip pain.  She has pain in the groin and lateral and superolateral portion of the right hip.  She has a history of back pain but this seems to be different  She had 2 rounds of prednisone with no relief  Presents for evaluation and management  Review of systems no numbness or tingling pain does not seem to be radicular down the right leg  Physical Exam Constitutional:      General: She is not in acute distress.    Appearance: She is obese. She is not ill-appearing, toxic-appearing or diaphoretic.  Cardiovascular:     Pulses: Normal pulses.  Musculoskeletal:     Comments: Lumbar spine exam is unremarkable no tenderness no pain with flexion or extension straight leg raise is negative  Right hip exam shows painful flexion internal rotation and abduction consistent with FAI syndrome  Strength alignment leg lengths normal.  Hips stable.  Skin:    General: Skin is warm and dry.     Capillary Refill: Capillary refill takes less than 2 seconds.  Neurological:     General: No focal deficit present.     Mental Status: She is alert.  Psychiatric:        Mood and Affect: Mood normal.        Behavior: Behavior normal.        Thought Content: Thought content normal.        Judgment: Judgment normal.    XRAYS FAI BOTH HIPS   SUSPECT LABRAL TEAR HIP JOINT RIGHT SIDE  RECOMMEND  MRI WITH JOINT CONTRAST LABRAL TEAR  Encounter Diagnoses  Name Primary?   Acute hip pain, right Yes   Tear of right acetabular labrum, initial encounter      CALL RESULTS TO PX

## 2021-07-21 ENCOUNTER — Encounter: Payer: Self-pay | Admitting: Orthopedic Surgery

## 2021-08-08 ENCOUNTER — Ambulatory Visit (HOSPITAL_COMMUNITY)
Admission: RE | Admit: 2021-08-08 | Discharge: 2021-08-08 | Disposition: A | Discharge status: Home / Self Care | Payer: No Typology Code available for payment source | Source: Ambulatory Visit | Attending: Orthopedic Surgery | Admitting: Orthopedic Surgery

## 2021-08-08 ENCOUNTER — Encounter (HOSPITAL_COMMUNITY): Payer: Self-pay

## 2021-08-08 DIAGNOSIS — M25551 Pain in right hip: Secondary | ICD-10-CM | POA: Insufficient documentation

## 2021-08-08 DIAGNOSIS — M160 Bilateral primary osteoarthritis of hip: Secondary | ICD-10-CM | POA: Insufficient documentation

## 2021-08-08 DIAGNOSIS — D250 Submucous leiomyoma of uterus: Secondary | ICD-10-CM | POA: Insufficient documentation

## 2021-08-08 DIAGNOSIS — S73191A Other sprain of right hip, initial encounter: Secondary | ICD-10-CM

## 2021-08-08 DIAGNOSIS — G8929 Other chronic pain: Secondary | ICD-10-CM | POA: Insufficient documentation

## 2021-08-08 MED ORDER — LIDOCAINE HCL (PF) 1 % IJ SOLN
INTRAMUSCULAR | Status: AC
  Administered 2021-08-08: 5 mL via INTRAMUSCULAR
  Filled 2021-08-08: qty 5

## 2021-08-08 MED ORDER — GADOBUTROL 1 MMOL/ML IV SOLN
2.0000 mL | Freq: Once | INTRAVENOUS | Status: AC | PRN
  Administered 2021-08-08: 0.05 mL

## 2021-08-08 MED ORDER — SODIUM CHLORIDE (PF) 0.9 % IJ SOLN
INTRAMUSCULAR | Status: AC
  Administered 2021-08-08: 5 mL via INTRAMUSCULAR
  Filled 2021-08-08: qty 10

## 2021-08-08 MED ORDER — IOHEXOL 180 MG/ML  SOLN
20.0000 mL | Freq: Once | INTRAMUSCULAR | Status: AC | PRN
  Administered 2021-08-08: 15 mL via INTRA_ARTICULAR

## 2021-08-08 MED ORDER — POVIDONE-IODINE 10 % EX SOLN
CUTANEOUS | Status: AC
  Filled 2021-08-08: qty 14.8

## 2021-08-08 NOTE — Procedures (Signed)
Preprocedure Dx: RT hip pain Postprocedure Dx: RT hip pain Procedure  Fluoroscopically guided RT joint injection for MR arthrogrpahy Radiologist:  Thornton Papas Anesthesia:  5 ml of 1% lidocaine Injectate:  10 ml of standard MR arthrography solution EBL:   None Complications: None

## 2021-08-16 ENCOUNTER — Telehealth: Payer: Self-pay | Admitting: Family Medicine

## 2021-08-16 ENCOUNTER — Other Ambulatory Visit: Payer: Self-pay | Admitting: Family Medicine

## 2021-08-16 ENCOUNTER — Other Ambulatory Visit: Payer: Self-pay | Admitting: *Deleted

## 2021-08-16 MED ORDER — FLUCONAZOLE 150 MG PO TABS: ORAL_TABLET | ORAL | 0 refills | Status: DC

## 2021-08-16 NOTE — Telephone Encounter (Signed)
Patient is requesting refill on diflucan sent  [place]

## 2021-08-16 NOTE — Telephone Encounter (Signed)
Patient says she is having some itching on the inside and a slight discharge. No fever, pelvic pain or bleeding. Prescription sent to pharmacy.

## 2021-08-31 ENCOUNTER — Other Ambulatory Visit: Payer: Self-pay | Admitting: Family Medicine

## 2021-08-31 DIAGNOSIS — E785 Hyperlipidemia, unspecified: Secondary | ICD-10-CM

## 2021-08-31 DIAGNOSIS — Z79899 Other long term (current) drug therapy: Secondary | ICD-10-CM

## 2021-08-31 NOTE — Telephone Encounter (Signed)
6 months worth on her meds Patient is due for lipid, liver, metabolic 7 due to hyperlipidemia as well as diuretic use very important for patient to get this done relatively soon so we can make sure her kidney function is doing fine

## 2021-09-08 ENCOUNTER — Encounter: Payer: Self-pay | Admitting: Orthopedic Surgery

## 2021-09-08 ENCOUNTER — Ambulatory Visit (INDEPENDENT_AMBULATORY_CARE_PROVIDER_SITE_OTHER): Payer: No Typology Code available for payment source | Admitting: Orthopedic Surgery

## 2021-09-08 ENCOUNTER — Telehealth: Payer: Self-pay | Admitting: Radiology

## 2021-09-08 DIAGNOSIS — M1611 Unilateral primary osteoarthritis, right hip: Secondary | ICD-10-CM

## 2021-09-08 NOTE — Telephone Encounter (Signed)
Call to schedule injection at [facility] Right hip

## 2021-09-08 NOTE — Progress Notes (Signed)
Chief Complaint  Patient presents with   Results    Review MRI right hip     Right hip pain follow-up after MRI.  Patient was thought to have labral tear had MRI with contrast.  Found that she had arthritis in both hips  She is also having some pain from her back radiates down her right leg into the lower leg  I think she is having 2 different issues.  The hip arthritis is causing the groin and anterior thigh pain and difficulty putting on her socks and shoes  The pain in her lower back radiating down her leg is most likely coming from lumbar disc pathology  We are going to do a Depo-Medrol joint injection and see if that helps  She is also a candidate for right knee injection which she wants to get near the time she goes to the [place]  My interpretation of the MRI is that the patient has bilateral hip arthritis without labral tear

## 2021-09-08 NOTE — Telephone Encounter (Signed)
Done

## 2021-09-09 LAB — LIPID PANEL
Chol/HDL Ratio: 4.7 ratio — ABNORMAL HIGH (ref 0.0–4.4)
Cholesterol, Total: 250 mg/dL — ABNORMAL HIGH (ref 100–199)
HDL: 53 mg/dL (ref >39)
LDL Chol Calc (NIH): 154 mg/dL — ABNORMAL HIGH (ref 0–99)
Triglycerides: 233 mg/dL — ABNORMAL HIGH (ref 0–149)
VLDL Cholesterol Cal: 43 mg/dL — ABNORMAL HIGH (ref 5–40)

## 2021-09-09 LAB — BASIC METABOLIC PANEL
BUN/Creatinine Ratio: 16 (ref 9–23)
BUN: 17 mg/dL (ref 6–24)
CO2: 24 mmol/L (ref 20–29)
Calcium: 10 mg/dL (ref 8.7–10.2)
Chloride: 101 mmol/L (ref 96–106)
Creatinine, Ser: 1.05 mg/dL — ABNORMAL HIGH (ref 0.57–1.00)
Glucose: 94 mg/dL (ref 70–99)
Potassium: 3.9 mmol/L (ref 3.5–5.2)
Sodium: 140 mmol/L (ref 134–144)
eGFR: 66 mL/min/{1.73_m2} (ref >59)

## 2021-09-09 LAB — HEPATIC FUNCTION PANEL
ALT: 19 IU/L (ref 0–32)
AST: 13 IU/L (ref 0–40)
Albumin: 4.4 g/dL (ref 3.9–4.9)
Alkaline Phosphatase: 66 IU/L (ref 44–121)
Bilirubin Total: 0.4 mg/dL (ref 0.0–1.2)
Bilirubin, Direct: 0.1 mg/dL (ref 0.00–0.40)
Total Protein: 6.7 g/dL (ref 6.0–8.5)

## 2021-09-11 ENCOUNTER — Encounter: Payer: Self-pay | Admitting: Family Medicine

## 2021-09-11 ENCOUNTER — Ambulatory Visit (INDEPENDENT_AMBULATORY_CARE_PROVIDER_SITE_OTHER): Payer: 59 | Admitting: Family Medicine

## 2021-09-11 DIAGNOSIS — D259 Leiomyoma of uterus, unspecified: Secondary | ICD-10-CM

## 2021-09-11 DIAGNOSIS — E785 Hyperlipidemia, unspecified: Secondary | ICD-10-CM

## 2021-09-11 DIAGNOSIS — Z8619 Personal history of other infectious and parasitic diseases: Secondary | ICD-10-CM

## 2021-09-11 DIAGNOSIS — G4700 Insomnia, unspecified: Secondary | ICD-10-CM

## 2021-09-11 DIAGNOSIS — I10 Essential (primary) hypertension: Secondary | ICD-10-CM

## 2021-09-11 MED ORDER — REPATHA 140 MG/ML ~~LOC~~ SOSY: PREFILLED_SYRINGE | SUBCUTANEOUS | 6 refills | Status: DC

## 2021-09-11 MED ORDER — HYDROCODONE-ACETAMINOPHEN 10-325 MG PO TABS: 1.0000 | ORAL_TABLET | ORAL | 0 refills | Status: DC | PRN

## 2021-09-11 MED ORDER — SERTRALINE HCL 100 MG PO TABS: 100.0000 mg | ORAL_TABLET | Freq: Every day | ORAL | 1 refills | Status: DC

## 2021-09-11 MED ORDER — SEMAGLUTIDE-WEIGHT MANAGEMENT 0.25 MG/0.5ML ~~LOC~~ SOAJ: 0.2500 mg | SUBCUTANEOUS | 1 refills | Status: DC

## 2021-09-11 MED ORDER — VALACYCLOVIR HCL 1 G PO TABS
ORAL_TABLET | ORAL | 1 refills | Status: DC
Start: 2021-09-11 — End: 2023-01-10

## 2021-09-11 MED ORDER — ALPRAZOLAM 1 MG PO TABS: ORAL_TABLET | ORAL | 5 refills | Status: DC

## 2021-09-11 MED ORDER — FLUCONAZOLE 150 MG PO TABS
ORAL_TABLET | ORAL | 0 refills | Status: DC
Start: 2021-09-11 — End: 2021-10-03

## 2021-09-11 NOTE — Patient Instructions (Signed)
Steps to Quit Smoking Smoking tobacco is the leading cause of preventable death. It can affect almost every organ in the body. Smoking puts you and those around you at risk for developing many serious chronic diseases. Quitting smoking can be very challenging. Do not get discouraged if you are not successful the first time. Some people need to make many attempts to quit before they achieve long-term success. Do your best to stick to your quit plan, and talk with your health care provider if you have any questions or concerns. How do I get ready to quit? When you decide to quit smoking, create a plan to help you succeed. Before you quit: Pick a date to quit. Set a date within the next 2 weeks to give you time to prepare. Write down the reasons why you are quitting. Keep this list in places where you will see it often. Tell your family, friends, and co-workers that you are quitting. Support from people you are close to can make quitting easier. Talk with your health care provider about your options for quitting smoking. Find out what treatment options are covered by your health insurance. Identify people, places, things, and activities that make you want to smoke (triggers). Avoid them. What first steps can I take to quit smoking? Throw away all cigarettes at home, at work, and in your car. Throw away smoking accessories, such as ashtrays and lighters. Clean your car. Make sure to empty the ashtray. Clean your home, including curtains and carpets. What strategies can I use to quit smoking? Talk with your health care provider about combining strategies, such as taking medicines while you are also receiving in-person counseling. Using these two strategies together makes you more likely to succeed in quitting than if you used either strategy on its own. If you are pregnant or breastfeeding, talk with your health care provider about finding counseling or other support strategies to quit smoking. Do not  take medicine to help you quit smoking unless your health care provider tells you to. Quit right away Quit smoking completely, instead of gradually reducing how much you smoke over a period of time. Stopping smoking right away may be more successful than gradually quitting. Attend in-person counseling to help you build problem-solving skills. You are more likely to succeed in quitting if you attend counseling sessions regularly. Even short sessions of 10 minutes can be effective. Take medicine You may take medicines to help you quit smoking. Some medicines require a prescription. You can also purchase over-the-counter medicines. Medicines may have nicotine in them to replace the nicotine in cigarettes. Medicines may: Help to stop cravings. Help to relieve withdrawal symptoms. Your health care provider may recommend: Nicotine patches, gum, or lozenges. Nicotine inhalers or sprays. Non-nicotine medicine that you take by mouth. Find resources Find resources and support systems that can help you quit smoking and remain smoke-free after you quit. These resources are most helpful when you use them often. They include: Online chats with a counselor. Telephone quitlines. Printed self-help materials. Support groups or group counseling. Text messaging programs. Mobile phone apps or applications. Use apps that can help you stick to your quit plan by providing reminders, tips, and encouragement. Examples of free services include Quit Guide from the CDC and smokefree.gov  What can I do to make it easier to quit?  Reach out to your family and friends for support and encouragement. Call telephone quitlines, such as 1-800-QUIT-NOW, reach out to support groups, or work with a counselor for   support. Ask people who smoke to avoid smoking around you. Avoid places that trigger you to smoke, such as bars, parties, or smoke-break areas at work. Spend time with people who do not smoke. Lessen the stress in your  life. Stress can be a smoking trigger for some people. To lessen stress, try: Exercising regularly. Doing deep-breathing exercises. Doing yoga. Meditating. What benefits will I see if I quit smoking? Over time, you should start to see positive results, such as: Improved sense of smell and taste. Decreased coughing and sore throat. Slower heart rate. Lower blood pressure. Clearer and healthier skin. The ability to breathe more easily. Fewer sick days. Summary Quitting smoking can be very challenging. Do not get discouraged if you are not successful the first time. Some people need to make many attempts to quit before they achieve long-term success. When you decide to quit smoking, create a plan to help you succeed. Quit smoking right away, not slowly over a period of time. Find resources and support systems that can help you quit smoking and remain smoke-free after you quit. This information is not intended to replace advice given to you by your health care provider. Make sure you discuss any questions you have with your health care provider. Document Revised: 01/27/2021 Document Reviewed: 01/27/2021 Elsevier Patient Education  2023 Elsevier Inc.  

## 2021-09-11 NOTE — Progress Notes (Signed)
   Subjective:    Patient ID: Carol Sharp, female    DOB: 1975/06/27, 46 y.o.   MRN: [id number]  HPI 6 Month follow up  Very nice patient Having multiple health issues currently Needs refills on her medicines Patient suffering with left hip osteoarthritis having an injection in the near future In addition to this suffers with morbid obesity her weight is gone up because she has been unable to exercise She is interested with GLP-1 medications She also has high blood pressure Insomnia In addition to this hyperlipidemia and had significant side effects with statins with severe body aches muscle aches joint pains Weight concern , request for pain med refill, states getting R hip injection this wednesday   Review of Systems     Objective:   Physical Exam General-in no acute distress Eyes-no discharge Lungs-respiratory rate normal, CTA CV-no murmurs,RRR Extremities skin warm dry no edema Neuro grossly normal Behavior normal, alert      Assessment & Plan:   1. Morbid obesity ([place]) Portion control regular physical activity recommended Wegovy prescribed may or may not be covered hard to tell from the electronics We did discuss side effects we also discussed not to be taken with individuals with history of thyroid or family history of thyroid medullary cancer Patient understands we would start below and gradually build up over 3 months and that she would need to lose 5% of her weight during this first 3 months  2. Uterine leiomyoma, unspecified location Fibroids one of them quite large was seen on recent MRI needs [facility]  3. Primary hypertension Blood pressure good control on current medications recent labs look good watch diet  4. Hyperlipidemia, unspecified hyperlipidemia type Severe hyperlipidemia unable to tolerate statins.  Statins cause significant muscle aches and pains Recommend Repatha 5. Insomnia, unspecified type Xanax as  needed for nighttime use caution drowsiness  6. H/O cold sores Valacyclovir as needed Follow-up 6 months

## 2021-09-12 ENCOUNTER — Telehealth: Payer: Self-pay | Admitting: *Deleted

## 2021-09-12 NOTE — Telephone Encounter (Signed)
PA for Repatha denied by insurance. Insurance states it was denied for plan exclusion

## 2021-09-13 ENCOUNTER — Ambulatory Visit (HOSPITAL_COMMUNITY)
Admission: RE | Admit: 2021-09-13 | Discharge: 2021-09-13 | Disposition: A | Discharge status: Home / Self Care | Payer: 59 | Source: Ambulatory Visit | Attending: Orthopedic Surgery | Admitting: Orthopedic Surgery

## 2021-09-13 ENCOUNTER — Encounter (HOSPITAL_COMMUNITY): Payer: Self-pay

## 2021-09-13 DIAGNOSIS — M1611 Unilateral primary osteoarthritis, right hip: Secondary | ICD-10-CM | POA: Insufficient documentation

## 2021-09-13 MED ORDER — LIDOCAINE HCL (PF) 1 % IJ SOLN
INTRAMUSCULAR | Status: AC
  Administered 2021-09-13: 5 mL
  Filled 2021-09-13: qty 5

## 2021-09-13 MED ORDER — METHYLPREDNISOLONE ACETATE 40 MG/ML IJ SUSP
INTRAMUSCULAR | Status: AC
  Administered 2021-09-13: 40 mg
  Filled 2021-09-13: qty 1

## 2021-09-13 MED ORDER — POVIDONE-IODINE 10 % EX SOLN
CUTANEOUS | Status: AC
  Administered 2021-09-13: 1
  Filled 2021-09-13: qty 14.8

## 2021-09-13 MED ORDER — BUPIVACAINE HCL (PF) 0.5 % IJ SOLN
INTRAMUSCULAR | Status: AC
  Administered 2021-09-13: 5 mL
  Filled 2021-09-13: qty 30

## 2021-09-13 MED ORDER — IOHEXOL 180 MG/ML  SOLN: 2.0000 mL | Freq: Once | INTRAMUSCULAR | Status: DC | PRN

## 2021-09-13 NOTE — Telephone Encounter (Signed)
To your knowledge do they cover [place]?

## 2021-09-13 NOTE — Telephone Encounter (Signed)
The denial did not state they covered Praluent or that it was non formulary- the denial reason was listed as plan exclusion under the benefit plan

## 2021-09-13 NOTE — Procedures (Signed)
Preprocedure Dx: Unilateral primary osteoarthritis, right hip Postprocedure Dx: Unilateral primary osteoarthritis, right hip Procedure  Fluoroscopically guided therapeutic RIGHT joint injection Radiologist:  Thornton Papas Anesthesia:  5 nl ml of 1% lidocaine Injectate:  '40mg'$  DepoMedrol, 3 ml Sensorcaine 0.5% Fluoro time:  0 minutes 30 seconds EBL:   None Complications: None

## 2021-09-17 NOTE — Telephone Encounter (Signed)
Please inform patient that according to her insurance these drugs are not covered for her cholesterol. The 2 drugs in question is Praluent and Repatha.  Apparently neither 1 of these are covered.  I can do a letter of necessity for these medications.  But then the patient would have to take that letter and work with her insurance company to see if she can get either 1 cover.    If the patient would like for [place] to do a letter please let me know.  Please see what she would like for [place] to do it thank you

## 2021-09-18 NOTE — Telephone Encounter (Signed)
Mychart message sent to patient.

## 2021-09-20 NOTE — Telephone Encounter (Signed)
Patient read my chart message 09/19/21 at 8:39am

## 2021-09-21 ENCOUNTER — Telehealth: Payer: Self-pay

## 2021-09-21 NOTE — Telephone Encounter (Signed)
Had MR showed fibroids in uterus, will see 10/03/21 for exam and get [place] ordered.

## 2021-09-21 NOTE — Telephone Encounter (Signed)
The pt stated that dr Karin Golden is emailing you and they feel she needs an appt

## 2021-09-21 NOTE — Telephone Encounter (Signed)
Pt had a MRI on hip. The radiologist saw a "tumor" in uterus. Please review MRI from 08/08/21 and advise. Thanks! [place]

## 2021-09-26 ENCOUNTER — Telehealth: Payer: Self-pay | Admitting: Family Medicine

## 2021-09-26 ENCOUNTER — Other Ambulatory Visit: Payer: Self-pay | Admitting: Family Medicine

## 2021-09-26 MED ORDER — HYDROCODONE-ACETAMINOPHEN 10-325 MG PO TABS
1.0000 | ORAL_TABLET | ORAL | 0 refills | Status: DC | PRN
Start: 2021-09-26 — End: 2021-10-03

## 2021-09-26 NOTE — Telephone Encounter (Signed)
Patient is requesting refill on hydrocodone / called into [address]

## 2021-09-26 NOTE — Telephone Encounter (Signed)
Pt contacted and verbalized understanding.  

## 2021-09-26 NOTE — Telephone Encounter (Signed)
Refill was sent in for occasional use

## 2021-10-03 ENCOUNTER — Ambulatory Visit (INDEPENDENT_AMBULATORY_CARE_PROVIDER_SITE_OTHER): Payer: No Typology Code available for payment source | Admitting: Adult Health

## 2021-10-03 ENCOUNTER — Encounter: Payer: Self-pay | Admitting: Adult Health

## 2021-10-03 VITALS — BP 142/96 | HR 75 | Ht 69.5 in | Wt 285.2 lb

## 2021-10-03 DIAGNOSIS — R102 Pelvic and perineal pain: Secondary | ICD-10-CM | POA: Diagnosis not present

## 2021-10-03 DIAGNOSIS — D219 Benign neoplasm of connective and other soft tissue, unspecified: Secondary | ICD-10-CM | POA: Diagnosis not present

## 2021-10-03 NOTE — Progress Notes (Signed)
  Subjective:     Patient ID: Carol Sharp, female   DOB: 31-Oct-1975, 46 y.o.   MRN: [id number]  HPI Carol Sharp is a 46 year old white female,married, [id number], in complaining of pain in back and pelvic area esp right side and had fibroid on MRI. Has seen Dr Aline Brochure about back and hip pain.  PCP is Dr Sallee Lange. Lab Results  Component Value Date   DIAGPAP  11/09/2020    - Negative for intraepithelial lesion or malignancy (NILM)   HPV NOT DETECTED 10/29/2017   [place] Negative 11/09/2020    Review of Systems +back pain +pelvic pain Has had some BTB sp ablation Reviewed past medical,surgical, social and family history. Reviewed medications and allergies.     Objective:   Physical Exam BP (!) 142/96 (BP Location: Left Arm, Patient Position: Sitting, Cuff Size: Large)   Pulse 75   Ht 5' 9.5" (1.765 m)   Wt 285 lb 3.2 oz (129.4 kg)   BMI 41.51 kg/m      Skin warm and dry.Pelvic: external genitalia is normal in appearance no lesions, vagina:pink,urethra has no lesions or masses noted, cervix:smooth and bulbous, uterus:is enlarged and  tender, adnexa: no masses, +RLQ tenderness noted. Bladder is non tender and no masses felt.  MRI showed 7.2 cm fibroid.  Fall risk is low  [facility] - 10/03/21 1104       Pregnancy Intention Screening   Does the patient want to become pregnant in the next year? No    Does the patient's partner want to become pregnant in the next year? No    Would the patient like to discuss contraceptive options today? No      Contraception Wrap Up   Current Method Female Sterilization    End Method Female Sterilization             Assessment:     1. Fibroids [place] scheduled for 10/06/21 at 12N at [facility] - US PELVIC COMPLETE WITH TRANSVAGINAL; Future Review handout on fibroids  2. Pelvic pain [place] 10/06/21 at 12N at [facility] - US PELVIC COMPLETE WITH TRANSVAGINAL; Future     Plan:     Follow up prn Will talk Monday when results back

## 2021-10-05 ENCOUNTER — Other Ambulatory Visit: Payer: Self-pay | Admitting: Family Medicine

## 2021-10-06 ENCOUNTER — Ambulatory Visit (HOSPITAL_BASED_OUTPATIENT_CLINIC_OR_DEPARTMENT_OTHER)
Admission: RE | Admit: 2021-10-06 | Discharge: 2021-10-06 | Disposition: A | Discharge status: Home / Self Care | Payer: No Typology Code available for payment source | Source: Ambulatory Visit | Attending: Adult Health | Admitting: Adult Health

## 2021-10-06 DIAGNOSIS — D219 Benign neoplasm of connective and other soft tissue, unspecified: Secondary | ICD-10-CM | POA: Diagnosis present

## 2021-10-06 DIAGNOSIS — R102 Pelvic and perineal pain: Secondary | ICD-10-CM | POA: Diagnosis present

## 2021-10-09 ENCOUNTER — Ambulatory Visit: Payer: 59 | Admitting: Adult Health

## 2021-10-10 ENCOUNTER — Telehealth: Payer: Self-pay | Admitting: Adult Health

## 2021-10-10 NOTE — Telephone Encounter (Signed)
Pt aware has 9.5 cm fibroid on [place] will get appt with Dr Nelda Marseille to talk surgical options

## 2021-10-10 NOTE — Telephone Encounter (Signed)
Patient called about her ultrasound results. Please advise.

## 2021-10-16 ENCOUNTER — Ambulatory Visit (INDEPENDENT_AMBULATORY_CARE_PROVIDER_SITE_OTHER): Payer: No Typology Code available for payment source | Admitting: Obstetrics & Gynecology

## 2021-10-16 ENCOUNTER — Encounter: Payer: Self-pay | Admitting: Obstetrics & Gynecology

## 2021-10-16 VITALS — BP 138/96 | HR 74 | Ht 69.0 in | Wt 287.0 lb

## 2021-10-16 DIAGNOSIS — R102 Pelvic and perineal pain: Secondary | ICD-10-CM | POA: Diagnosis not present

## 2021-10-16 DIAGNOSIS — D219 Benign neoplasm of connective and other soft tissue, unspecified: Secondary | ICD-10-CM | POA: Diagnosis not present

## 2021-10-16 DIAGNOSIS — Z9889 Other specified postprocedural states: Secondary | ICD-10-CM

## 2021-10-16 DIAGNOSIS — I10 Essential (primary) hypertension: Secondary | ICD-10-CM

## 2021-10-16 DIAGNOSIS — Z6841 Body Mass Index (BMI) 40.0 and over, adult: Secondary | ICD-10-CM | POA: Diagnosis not present

## 2021-10-16 DIAGNOSIS — M1610 Unilateral primary osteoarthritis, unspecified hip: Secondary | ICD-10-CM

## 2021-10-16 NOTE — Progress Notes (Signed)
GYN VISIT Patient name: Carol Sharp MRN [id number]  Date of birth: 05/02/1975 Chief Complaint:   Pre-op Exam (Discuss surgery for fibroids)  History of Present Illness:   Carol Sharp is a 46 y.o. [phone] female being seen today for the following .     Pelvic pain/Uterine fibroid: Pt notes midline pelvic pain that radiates to her right side and a pulling discomfort.  Feels this pain everytime she gets up.  She has tried OTC remedies. She does note some cramping around the time of her period.  She has also been struggling with back pain on right side and radiates down her leg- arthritis in hip- s/p steroid shot.  Difficulty sitting in certain positions and ambulating.   [place] reviewed: 12.9 x 10.1 x 9.4 cm = volume: 641 mL. Anteverted. Heterogeneous myometrium. Large mass at fundus and upper uterine segment 7.9 x 7.8 x 9.5 cm most consistent with a uterine leiomyoma.  PSHx: C-section x 1, BTL, endometrial ablation Umbilical hernia repair   No LMP recorded. Patient has had an ablation.     11/09/2020    3:27 PM 09/16/2020   10:17 AM 08/03/2020   10:26 AM 01/04/2020    4:28 PM 11/10/2019    4:22 PM  Depression screen PHQ 2/9  Decreased Interest '2 1 1 2 1  '$ Down, Depressed, Hopeless 2 1 0 1 1  PHQ - 2 Score '4 2 1 3 2  '$ Altered sleeping '3 3 3 3 3  '$ Tired, decreased energy 2 1 0 2 1  Change in appetite 2 1 0 3 1  Feeling bad or failure about yourself  2 1 0 2 1  Trouble concentrating 2 3 0 2 3  Moving slowly or fidgety/restless 2 2 0 1 3  Suicidal thoughts 0 0 0 0 0  PHQ-9 Score '17 13 4 16 14  '$ Difficult doing work/chores  Not difficult at all Not difficult at all Somewhat difficult Very difficult     Review of Systems:   Pertinent items are noted in HPI Denies fever/chills, dizziness, headaches, visual disturbances, fatigue, shortness of breath, chest pain or vomiting. Pertinent History Reviewed:  Reviewed past medical,surgical, social, obstetrical and family history.   Reviewed problem list, medications and allergies. Physical Assessment:   Vitals:   10/16/21 0905 10/16/21 0909  BP: (!) 151/120 (!) 138/96  Pulse: 63 74  Weight: 287 lb (130.2 kg)   Height: '5\' 9"'$  (1.753 m)   Body mass index is 42.38 kg/m.       Physical Examination:   General appearance: alert, well appearing, and in no distress  Psych: mood appropriate, normal affect  Skin: warm & dry   Cardiovascular: normal heart rate noted  Respiratory: normal respiratory effort, no distress  Abdomen: obese, soft, no rebound, no guarding, uterus below umbilicus  Pelvic: normal external genitalia, vulva, vagina, cervix, unable to appreciate enlarged uterus on bimanual exam due to body habitus, some mild tenderness noted with exam  Extremities: 2+ non-pitting edema  Chaperone:  Dr. Zigmund Daniel     Assessment & Plan:  1) Pelvic pain, uterine fibroid -discussed with patient that while fibroid is present- typical symptoms include HMB and dysmenorrhea -discussed that large fibroids can cause pelvic pain and pressure- based on her exam and medical history it is unclear if the pain is directly due to her uterus/uterine fibroid -there is no explicit way to determine if the source of her pain is indeed her uterus  -should indeed the fibroid be  the source of her pain management options would include [place] (though more to address HMB) or hysterectomy -risk/benefit reviewed with patient including but not limited to risk of bleeding, infection and injury to surrounding organs -discussed that should hysterectomy be completed there is no guarantee that she will note improvement of her pain  -additionally thought about considering Myfembree as an alternative to see if this helped improved her pain; however, due to her uncontrolled BP this is not an ideal option.  Pt followed by PCP who has been treating her BP.  '[]'$  plan to repeat BP later this week- if within normal levels may consider course of Myfembree to see if  pt notes improvement.  If she does that indeed we may be able to isolate that her pain is from her uterus/fibroids   Return for TBD.   Janyth Pupa, DO Attending [place], [facility] for [employer], [place]

## 2021-10-18 ENCOUNTER — Telehealth: Payer: Self-pay

## 2021-10-18 NOTE — Telephone Encounter (Signed)
Patient called and wanted Dr. Nelda Marseille to know that her blood pressure was 138/96 and that she wants to move forward with the partial hysterectomy.

## 2021-10-19 ENCOUNTER — Other Ambulatory Visit: Payer: Self-pay | Admitting: Obstetrics & Gynecology

## 2021-10-19 DIAGNOSIS — D219 Benign neoplasm of connective and other soft tissue, unspecified: Secondary | ICD-10-CM

## 2021-10-19 MED ORDER — MYFEMBREE 40-1-0.5 MG PO TABS: 1.0000 | ORAL_TABLET | Freq: Every day | ORAL | 11 refills | Status: DC

## 2021-10-19 NOTE — Progress Notes (Signed)
Rx sent in for [facility]

## 2021-10-30 ENCOUNTER — Encounter: Payer: Self-pay | Admitting: Obstetrics & Gynecology

## 2021-10-31 ENCOUNTER — Encounter: Payer: Self-pay | Admitting: Obstetrics & Gynecology

## 2021-12-07 ENCOUNTER — Ambulatory Visit (INDEPENDENT_AMBULATORY_CARE_PROVIDER_SITE_OTHER): Payer: No Typology Code available for payment source | Admitting: Orthopedic Surgery

## 2021-12-07 ENCOUNTER — Encounter: Payer: Self-pay | Admitting: Orthopedic Surgery

## 2021-12-07 DIAGNOSIS — G8929 Other chronic pain: Secondary | ICD-10-CM

## 2021-12-07 DIAGNOSIS — M1611 Unilateral primary osteoarthritis, right hip: Secondary | ICD-10-CM

## 2021-12-07 DIAGNOSIS — M25561 Pain in right knee: Secondary | ICD-10-CM | POA: Diagnosis not present

## 2021-12-07 MED ORDER — METHYLPREDNISOLONE ACETATE 40 MG/ML IJ SUSP
40.0000 mg | Freq: Once | INTRAMUSCULAR | Status: AC
  Administered 2021-12-07: 40 mg via INTRA_ARTICULAR

## 2021-12-07 NOTE — Progress Notes (Signed)
FOLLOW UP   Encounter Diagnoses  Name Primary?   Unilateral primary osteoarthritis, right hip Yes   Chronic pain of right knee      Chief Complaint  Patient presents with   Knee Pain    Right/ here for injection only appointment      Procedure note right knee injection   verbal consent was obtained to inject right knee joint  Timeout was completed to confirm the site of injection  The medications used were depomedrol 40 mg and 1% lidocaine 3 cc Anesthesia was provided by ethyl chloride and the skin was prepped with alcohol.  After cleaning the skin with alcohol a 20-gauge needle was used to inject the right knee joint. There were no complications. A sterile bandage was applied.   FU PRN

## 2021-12-07 NOTE — Patient Instructions (Signed)

## 2021-12-14 ENCOUNTER — Ambulatory Visit (INDEPENDENT_AMBULATORY_CARE_PROVIDER_SITE_OTHER): Payer: No Typology Code available for payment source | Admitting: Nurse Practitioner

## 2021-12-14 ENCOUNTER — Telehealth: Payer: Self-pay | Admitting: Family Medicine

## 2021-12-14 ENCOUNTER — Encounter: Payer: Self-pay | Admitting: Nurse Practitioner

## 2021-12-14 VITALS — BP 112/73 | HR 74 | Temp 98.1°F | Ht 69.0 in | Wt 282.0 lb

## 2021-12-14 DIAGNOSIS — M25551 Pain in right hip: Secondary | ICD-10-CM | POA: Diagnosis not present

## 2021-12-14 MED ORDER — PREDNISONE 10 MG PO TABS: ORAL_TABLET | ORAL | 0 refills | Status: DC

## 2021-12-14 NOTE — Progress Notes (Signed)
Subjective:    Patient ID: Carol Sharp, female    DOB: [birth year], 46 y.o.   MRN: [id number]  HPI  46 year old patient present to clinic for continued right lower back pain, right hip pain, and right leg pain over the past 5 months. Patient Right side low back pain , tight thigh and leg pain and weakness chronic concern. Requesting something for pain.  Patient has seen sports medicine and had injections in right hip and right knee which helped.  Patient also scheduled for hysterectomy.  Patient states that they found fibroids in her uterus that were rather large.  Patient believes that fibroids may be contributing to her musculoskeletal pain.  Patient states she is willing to see Ortho after her hysterectomy with hopes that hysterectomy will help resolve some of her pain and Ortho referral may not be needed at that time.    Review of Systems     Objective:   Physical Exam Vitals reviewed.  Constitutional:      General: She is not in acute distress.    Appearance: Normal appearance. She is normal weight. She is not ill-appearing, toxic-appearing or diaphoretic.  HENT:     Head: Normocephalic and atraumatic.  Cardiovascular:     Rate and Rhythm: Normal rate and regular rhythm.     Pulses: Normal pulses.     Heart sounds: Normal heart sounds. No murmur heard. Pulmonary:     Effort: Pulmonary effort is normal. No respiratory distress.     Breath sounds: Normal breath sounds. No wheezing.  Musculoskeletal:     Comments: Strength 5 out of 5 to right lower extremity.  Range of motion intact to both right hip and right knee however painful.  Tenderness to palpation of right knee and right hip.  No obvious swelling to the areas.  Right lower extremity pulses intact.  Skin:    General: Skin is warm.     Capillary Refill: Capillary refill takes less than 2 seconds.  Neurological:     Mental Status: She is alert.     Comments: Grossly intact  Psychiatric:        Mood and Affect:  Mood normal.        Behavior: Behavior normal.           Assessment & Plan:   1. Right hip pain -We will prescribe prednisone to help manage patient's pain - predniSONE (DELTASONE) 10 MG tablet; Take 5 tablets (50 mg total) by mouth daily with breakfast for 2 days, THEN 4 tablets (40 mg total) daily with breakfast for 2 days, THEN 3 tablets (30 mg total) daily with breakfast for 2 days, THEN 2 tablets (20 mg total) daily with breakfast for 2 days, THEN 1 tablet (10 mg total) daily with breakfast for 2 days.  Dispense: 30 tablet; Refill: 0 -Patient has Mobic and Zanaflex at home. -Offered refill of Zanaflex however patient declined stating she still had some at home. -Reviewed red flags such as foot drop or weakness to extremities. -Return to clinic if symptoms do not improve after hysterectomy and will consider orthopedic referral at that time -Return to clinic after hysterectomy for evaluation    Note:  This document was prepared using Dragon voice recognition software and may include unintentional dictation errors. Note - This record has been created using [employer].  Chart creation errors have been sought, but may not always  have been located. Such creation errors do not reflect on  the standard of  medical care.

## 2021-12-14 NOTE — Telephone Encounter (Signed)
error 

## 2021-12-18 ENCOUNTER — Encounter: Payer: Self-pay | Admitting: Nurse Practitioner

## 2021-12-22 ENCOUNTER — Encounter: Payer: Self-pay | Admitting: Obstetrics & Gynecology

## 2021-12-22 ENCOUNTER — Telehealth (INDEPENDENT_AMBULATORY_CARE_PROVIDER_SITE_OTHER): Payer: No Typology Code available for payment source | Admitting: Obstetrics & Gynecology

## 2021-12-22 DIAGNOSIS — D219 Benign neoplasm of connective and other soft tissue, unspecified: Secondary | ICD-10-CM

## 2021-12-22 DIAGNOSIS — Z6841 Body Mass Index (BMI) 40.0 and over, adult: Secondary | ICD-10-CM

## 2021-12-22 DIAGNOSIS — R102 Pelvic and perineal pain: Secondary | ICD-10-CM | POA: Diagnosis not present

## 2021-12-22 NOTE — Progress Notes (Signed)
TELEHEALTH GYNECOLOGY VISIT ENCOUNTER NOTE  Provider location: [facility] [place] [facility]   Patient location: Home  I connected with Carol Sharp on 12/22/21 at 11:50 AM EDT by telephone and verified that I am speaking with the correct person using two identifiers. Patient was unable to do MyChart audiovisual encounter due to technical difficulties, she tried several times.    I discussed the limitations, risks, security and privacy concerns of performing an evaluation and management service by telephone and the availability of in person appointments. I also discussed with the patient that there may be a patient responsible charge related to this service. The patient expressed understanding and agreed to proceed.   History:  Carol Sharp is a 46 y.o. [phone] female being evaluated today for preop visit for upcoming TAH, BS scheduled for 11/29.    In review, she notes considerable pelvic pain that has been going since the summer.  Still notes feel pelvic pressure, still mostly on her right side.  Mostly along the hairline.  Noted some mild bleeding about 2 wks ago, a little bit heavier than normal.  Of note, s/p endometrial ablation  At her last visit, options were reviewed including medical management like Myfembree, which she was unable to get due to cost.  At this time, she would prefer to proceed with surgical intervention.   Past Medical History:  Diagnosis Date   Anxiety    Arthritis    Breast nodule 08/02/2015   Depression    Hypertension    MRSA cellulitis 06/2003   PONV (postoperative nausea and vomiting)    Past Surgical History:  Procedure Laterality Date   ABLATION  06/19/2008   CESAREAN SECTION     CHOLECYSTECTOMY     COLONOSCOPY WITH PROPOFOL N/A 02/16/2021   Procedure: COLONOSCOPY WITH PROPOFOL;  Surgeon: Eloise Harman, DO;  Location: [facility];  Service: Endoscopy;  Laterality: N/A;  1:00pm   DILATION AND CURETTAGE OF UTERUS   11/20/1998   INCISIONAL HERNIA REPAIR N/A 03/24/2021   Procedure: LAPAROSCOPIC INCISIONAL HERNIA W/MESH;  Surgeon: Aviva Signs, MD;  Location: [facility];  Service: General;  Laterality: N/A;   KNEE ARTHROSCOPY WITH MEDIAL MENISECTOMY Right 04/18/2017   Procedure: RIGHT KNEE ARTHROSCOPY WITH PARTIAL  MEDIAL MENISECTOMY;  Surgeon: Carole Civil, MD;  Location: [facility];  Service: Orthopedics;  Laterality: Right;   TONSILLECTOMY  1981?   TUBAL LIGATION     The following portions of the patient's history were reviewed and updated as appropriate: allergies, current medications, past family history, past medical history, past social history, past surgical history and problem list.   [place] last completed 09/2021:  12.9 x 10.1 x 9.4 cm = volume: 641 mL. Anteverted. Heterogeneous myometrium. Large mass at fundus and upper uterine segment 7.9 x 7.8 x 9.5 cm most consistent with a uterine leiomyoma.   Review of Systems:  Pertinent items noted in HPI and remainder of comprehensive ROS otherwise negative.  Physical Exam:   General:  Alert, oriented and cooperative.   Mental Status: Normal mood and affect perceived. Normal judgment and thought content.  Physical exam deferred due to nature of the encounter   Assessment and Plan:  Pelvic pain, Uterine fibroid   -reviewed upcoming surgery for TAH, BSO -reviewed risk/benefit and alternatives including but not limited to risk of bleeding, infection and injury to surrounding organs.  While pelvic pain is likely to improve s/p surgery, cannot guarantee complete resolution of her symptoms -discussed hospital  expectations and recovery -Questions and concerns were addressed and she desires to proceed as scheduled  I provided 20 minutes of non-face-to-face time during this encounter.   Annalee Genta, [place] for [employer], [place]

## 2021-12-26 ENCOUNTER — Encounter: Payer: Self-pay | Admitting: Family Medicine

## 2021-12-26 ENCOUNTER — Telehealth: Payer: Self-pay

## 2021-12-26 ENCOUNTER — Other Ambulatory Visit: Payer: Self-pay | Admitting: Family Medicine

## 2021-12-26 DIAGNOSIS — M25551 Pain in right hip: Secondary | ICD-10-CM

## 2021-12-26 MED ORDER — HYDROCODONE-ACETAMINOPHEN 5-325 MG PO TABS: ORAL_TABLET | ORAL | 0 refills | Status: DC

## 2021-12-26 MED ORDER — PREDNISONE 20 MG PO TABS: ORAL_TABLET | ORAL | 0 refills | Status: DC

## 2021-12-26 MED ORDER — HYDROCODONE-ACETAMINOPHEN 10-325 MG PO TABS: ORAL_TABLET | ORAL | 0 refills | Status: DC

## 2021-12-26 MED ORDER — PREDNISONE 10 MG PO TABS: ORAL_TABLET | ORAL | 0 refills | Status: DC

## 2021-12-26 NOTE — Telephone Encounter (Signed)
Pt went to pharmacy and they did not have the '10mg'$  325 Hydrocodone but they do have the '5mg'$  325 hydrocodone [facility] [address]

## 2021-12-26 NOTE — Telephone Encounter (Signed)
This was handled through the MyChart message and has already been sent in thank you

## 2022-01-10 NOTE — Patient Instructions (Signed)
OASIS GOEHRING  01/10/2022     '@[email or web address]'$ @   Your procedure is scheduled on 01/17/22.  Report to [facility] at 6:00 A.M.  Call this number if you have problems the morning of surgery:  [phone]  If you experience any cold or flu symptoms such as cough, fever, chills, shortness of breath, etc. between now and your scheduled surgery, please notify us at the above number.   Remember:  Do not eat or drink after midnight.     Take these medicines the morning of surgery with A SIP OF WATER : Xanax Norco Zoloft    Do not wear jewelry, make-up or nail polish.  Do not wear lotions, powders, or perfumes, or deodorant.  Do not shave 48 hours prior to surgery.  Men may shave face and neck.  Do not bring valuables to the hospital.  [facility] is not responsible for any belongings or valuables.  Contacts, dentures or bridgework may not be worn into surgery.  Leave your suitcase in the car.  After surgery it may be brought to your room.  For patients admitted to the hospital, discharge time will be determined by your treatment team.  Patients discharged the day of surgery will not be allowed to drive home.   Name and phone number of your driver:   Family Special instructions:  N/A  Please read over the following fact sheets that you were given. Care and Recovery After Surgery  Abdominal Hysterectomy Abdominal hysterectomy is a surgery to remove the womb. The womb is also called the uterus. The womb is the part of the body that holds a growing baby. This surgery may be done if you have: Cancer of the womb. Growths in your womb. Infection. Pain. Very bad bleeding. Problems with your menstrual period. Other health problems that affect the organs that help you get pregnant. Other parts that help in pregnancy may also be removed. These include: The lowest part of the womb (cervix). The organs that make eggs (ovaries). The tubes that move the egg to the womb (fallopian  tubes). Tell your doctor about: Any allergies you have. All medicines you are taking. This includes vitamins, herbs, eye drops, creams, and over-the-counter medicines. Any problems you or family members have had with medicines that make you fall asleep (anesthetic medicines). Any blood disorders you have. Any surgeries you have had. Any medical conditions you have. Whether you are pregnant or may be pregnant. What are the risks? Generally, this is a safe surgery. But problems may occur, including: Bleeding. Infection. Allergies. Damage to nearby parts. Nerve injury. Less interest in sex. Pain during sex. Blood clots. What happens before the procedure? Staying hydrated Follow instructions from your doctor about hydration. These may include: Up to 2 hours before the procedure - you may continue to drink clear liquids. These include water, clear fruit juice, black coffee, and plain tea. Eating and drinking restrictions Follow instructions from your doctor about eating and drinking. These may include: 8 hours before the procedure - stop eating heavy meals or foods. These include meat, fried foods, or fatty foods. 6 hours before the procedure - stop eating light meals or foods. These include toast or cereal. 6 hours before the procedure - stop drinking milk or drinks that contain milk. 2 hours before the procedure - stop drinking clear liquids. Medicines Ask your doctor about changing or stopping: Your normal medicines. Vitamins, herbs, and supplements. Over-the-counter medicines. Do not take aspirin or ibuprofen unless you are  told to. You may be asked to take a medicine that helps you poop (laxative). Surgery safety For your safety, your doctor may: [place] the area of surgery. Remove hair at the surgery site. Ask you to wash with a soap that kills germs. Give you antibiotic medicine. General instructions This surgery can affect the way you feel about yourself. Ask your doctor  about the changes caused by this surgery. You may have blood and urine tests. Do not smoke or use any products that contain nicotine or tobacco for 4 weeks before the procedure. If you need help quitting, ask your doctor. You may need to have an enema to clean out your butt and lower colon. Plan to have a responsible adult take you home from the hospital or clinic. What happens during the procedure? An IV tube will be put into one of your veins. You may be given: A sedative. This medicine helps you relax. Anesthetics. These medicines: Numb certain areas of your body. Make you fall asleep for surgery. Tight (compression) stockings will be placed on your legs. This will help with blood flow. A thin tube will be placed to help drain your pee (urine). A cut (incision) will be made through the skin in your lower belly. The body tissue that covers your womb will be moved aside. Your doctor will take out the womb and other parts if needed. Bleeding will be stopped with clamps or stitches (sutures). Your cut will be closed with sutures, skin glue, or skin tape. A bandage (dressing) will be placed over the cut. The procedure may vary among doctors and hospitals. What happens after the procedure? You will be monitored until you leave the hospital or clinic. This includes checking your blood pressure, heart rate, breathing rate, and blood oxygen level. You will be given medicine for pain. You will need to stay in the hospital for 1-2 days. You will eat a liquid diet at first. You will still have the tube in place to drain your pee. You may have to wear tight stockings. These stockings help to prevent blood clots and reduce swelling in your legs. You will be asked to walk as soon as possible. You will do breathing exercises or use a machine to help keep your lungs clear. You may need to use a pad for fluids that come from your vagina. Summary Abdominal hysterectomy is a surgery to remove your  womb. The womb is the part of the body that holds a growing baby. Talk with your doctor about the changes this surgery may cause. The changes can affect how you feel about yourself. You will be given medicine for pain. You will need to stay in the hospital for 1-2 days. This information is not intended to replace advice given to you by your health care provider. Make sure you discuss any questions you have with your health care provider. Document Revised: 10/08/2019 Document Reviewed: 10/08/2019 Elsevier Patient Education  [place], also called tubectomy, is the surgical removal of one of the fallopian tubes. The fallopian tubes allow eggs to travel from the ovaries to the uterus. Removing one fallopian tube does not prevent pregnancy. It also does not cause problems with your menstrual periods. You may need this procedure if you: Have an ectopic pregnancy. This is when a fertilized egg attaches to the fallopian tube instead of the uterus. An ectopic pregnancy can cause the tube to burst or tear (rupture). Have an infected fallopian tube. Have cancer of  the fallopian tube or nearby organs. Have had an ovary removed due to a cyst or tumor. Have had your uterus removed. Are at high risk for ovarian cancer. There are three different methods that can be used for a salpingectomy: An open method in which one large incision is made in your abdomen. A laparoscopic method in which a thin, lighted tube with a tiny camera (laparoscope) is used to help perform the procedure. The laparoscope allows a surgeon to make several small incisions in the abdomen instead of one large incision. A robot-assisted method in which a computer is used to control surgical instruments that are attached to robotic arms. Tell a health care provider about: Any allergies you have. All medicines you are taking, including vitamins, herbs, eye drops, creams, and over-the-counter  medicines. Any problems you or family members have had with anesthetic medicines. Any blood disorders you have. Any surgeries you have had. Any medical conditions you have. Whether you are pregnant or may be pregnant. What are the risks? Generally, this is a safe procedure. However, problems may occur, including: Infection. Bleeding. Allergic reactions to medicines. Blood clots in the legs or lungs. Damage to nearby structures or organs. What happens before the procedure? Staying hydrated Follow instructions from your health care provider about hydration, which may include: Up to 2 hours before the procedure - you may continue to drink clear liquids, such as water, clear fruit juice, black coffee, and plain tea. Eating and drinking restrictions Follow instructions from your health care provider about eating and drinking, which may include: 8 hours before the procedure - stop eating heavy meals or foods, such as meat, fried foods, or fatty foods. 6 hours before the procedure - stop eating light meals or foods, such as toast or cereal. 6 hours before the procedure - stop drinking milk or drinks that contain milk. 2 hours before the procedure - stop drinking clear liquids. Medicines Ask your health care provider about: Changing or stopping your regular medicines. This is especially important if you are taking diabetes medicines or blood thinners. Taking medicines such as aspirin and ibuprofen. These medicines can thin your blood. Do not take these medicines unless your health care provider tells you to take them. Taking over-the-counter medicines, vitamins, herbs, and supplements. General instructions Do not use any products that contain nicotine or tobacco for at least 4 weeks before the procedure. These products include cigarettes, chewing tobacco, and vaping devices, such as e-cigarettes. If you need help quitting, ask your health care provider. You may have an exam or tests, such as an  electrocardiogram (ECG) or a blood or urine test. Ask your health care provider: How your surgery site will be marked. What steps will be taken to help prevent infection. These steps may include: Removing hair at the surgery site. Washing skin with a germ-killing soap. Taking antibiotic medicine. Plan to have a responsible adult take you home from the hospital or clinic. If you will be going home right after the procedure, plan to have a responsible adult care for you for the time you are told. This is important. What happens during the procedure? An IV will be inserted into one of your veins. You will be given one or both of the following: A medicine to help you relax (sedative). A medicine to make you fall asleep (general anesthetic). A small, thin tube (catheter) may be inserted through your urethra and into your bladder. This will drain urine during your procedure. Depending on the type  of procedure you are having, one incision or several small incisions will be made in your abdomen. Your fallopian tube and ovary will be cut away from the uterus and removed. Your blood vessels will be clamped and tied to prevent excess bleeding. The incision or incisions in your abdomen will be closed with stitches (sutures), staples, or skin glue. A bandage (dressing) may be placed over your incision or incisions. The procedure may vary among health care providers and hospitals. What happens after the procedure?  Your blood pressure, heart rate, breathing rate, and blood oxygen level will be monitored until you leave the hospital or clinic. You may continue to receive fluids and medicines through an IV. You may continue to have a catheter draining your urine. You may have to wear compression stockings. These stockings help to prevent blood clots and reduce swelling in your legs. You will be given pain medicine as needed. If you were given a sedative during the procedure, it can affect you for several  hours. Do not drive or operate machinery until your health care provider says that it is safe. Summary Salpingectomy is a surgical procedure to remove one of the fallopian tubes. The procedure may be done with an open incision, a thin, lighted tube with a tiny camera (laparoscope), or computer-controlled instruments. Depending on the type of procedure you have, one incision or several small incisions will be made in your abdomen. Your blood pressure, heart rate, breathing rate, and blood oxygen level will be monitored until you leave the hospital or clinic. Plan to have a responsible adult take you home from the hospital or clinic. This information is not intended to replace advice given to you by your health care provider. Make sure you discuss any questions you have with your health care provider. Document Revised: 12/29/2019 Document Reviewed: 12/29/2019 Elsevier Patient Education  [place] Anesthesia, Adult General anesthesia is the use of medicine to make you fall asleep (unconscious) for a medical procedure. General anesthesia must be used for certain procedures. It is often recommended for surgery or procedures that: Last a long time. Require you to be still or in an unusual position. Are major and can cause blood loss. Affect your breathing. The medicines used for general anesthesia are called general anesthetics. During general anesthesia, these medicines are given along with medicines that: Prevent pain. Control your blood pressure. Relax your muscles. Prevent nausea and vomiting after the procedure. Tell a health care provider about: Any allergies you have. All medicines you are taking, including vitamins, herbs, eye drops, creams, and over-the-counter medicines. Your history of any: Medical conditions you have, including: High blood pressure. Bleeding problems. Diabetes. Heart or lung conditions, such as: Heart failure. Sleep apnea. Asthma. Chronic  obstructive pulmonary disease (COPD). Current or recent illnesses, such as: Upper respiratory, chest, or ear infections. Cough or fever. Tobacco or drug use, including marijuana or alcohol use. Depression or anxiety. Surgeries and types of anesthetics you have had. Problems you or family members have had with anesthetic medicines. Whether you are pregnant or may be pregnant. Whether you have any chipped or loose teeth, dentures, caps, bridgework, or issues with your mouth, swallowing, or choking. What are the risks? Your health care provider will talk with you about risks. These may include: Allergic reaction to the medicines. Lung and heart problems. Inhaling food or liquid from the stomach into the lungs (aspiration). Nerve injury. Injury to the lips, mouth, teeth, or gums. Stroke. Waking up during your  procedure and being unable to move. This is rare. These problems are more likely to develop if you are having a major surgery or if you have an advanced or serious medical condition. You can prevent some of these complications by answering all of your health care provider's questions thoroughly and by following all instructions before your procedure. General anesthesia can cause side effects, including: Nausea or vomiting. A sore throat or hoarseness from the breathing tube. Wheezing or coughing. Shaking chills or feeling cold. Body aches. Sleepiness. Confusion, agitation (delirium), or anxiety. What happens before the procedure? When to stop eating and drinking Follow instructions from your health care provider about what you may eat and drink before your procedure. If you do not follow your health care provider's instructions, your procedure may be delayed or canceled. Medicines Ask your health care provider about: Changing or stopping your regular medicines. These include any diabetes medicines or blood thinners you take. Taking medicines such as aspirin and ibuprofen. These  medicines can thin your blood. Do not take them unless your health care provider tells you to. Taking over-the-counter medicines, vitamins, herbs, and supplements. General instructions Do not use any products that contain nicotine or tobacco for at least 4 weeks before the procedure. These products include cigarettes, chewing tobacco, and vaping devices, such as e-cigarettes. If you need help quitting, ask your health care provider. If you brush your teeth on the morning of the procedure, make sure to spit out all of the water and toothpaste. If told by your health care provider, bring your sleep apnea device with you to surgery (if applicable). If you will be going home right after the procedure, plan to have a responsible adult: Take you home from the hospital or clinic. You will not be allowed to drive. Care for you for the time you are told. What happens during the procedure?  An IV will be inserted into one of your veins. You will be given one or more of the following through a face mask or IV: A sedative. This helps you relax. Anesthesia. This will: Numb certain areas of your body. Make you fall asleep for surgery. After you are unconscious, a breathing tube may be inserted down your throat to help you breathe. This will be removed before you wake up. An anesthesia provider, such as an anesthesiologist, will stay with you throughout your procedure. The anesthesia provider will: Keep you comfortable and safe by continuing to give you medicines and adjusting the amount of medicine that you get. Monitor your blood pressure, heart rate, and oxygen levels to make sure that the anesthetics do not cause any problems. The procedure may vary among health care providers and hospitals. What happens after the procedure? Your blood pressure, temperature, heart rate, breathing rate, and blood oxygen level will be monitored until you leave the hospital or clinic. You will wake up in a recovery area.  You may wake up slowly. You may be given medicine to help you with pain, nausea, or any other side effects from the anesthesia. Summary General anesthesia is the use of medicine to make you fall asleep (unconscious) for a medical procedure. Follow your health care provider's instructions about when to stop eating, drinking, or taking certain medicines before your procedure. Plan to have a responsible adult take you home from the hospital or clinic. This information is not intended to replace advice given to you by your health care provider. Make sure you discuss any questions you have with your health  care provider. Document Revised: 05/04/2021 Document Reviewed: 05/04/2021 Elsevier Patient Education  [place].   How to Use Chlorhexidine Before Surgery Chlorhexidine gluconate (CHG) is a germ-killing (antiseptic) solution that is used to clean the skin. It can get rid of the bacteria that normally live on the skin and can keep them away for about 24 hours. To clean your skin with CHG, you may be given: A CHG solution to use in the shower or as part of a sponge bath. A prepackaged cloth that contains CHG. Cleaning your skin with CHG may help lower the risk for infection: While you are staying in the intensive care unit of the hospital. If you have a vascular access, such as a central line, to provide short-term or long-term access to your veins. If you have a catheter to drain urine from your bladder. If you are on a ventilator. A ventilator is a machine that helps you breathe by moving air in and out of your lungs. After surgery. What are the risks? Risks of using CHG include: A skin reaction. Hearing loss, if CHG gets in your ears and you have a perforated eardrum. Eye injury, if CHG gets in your eyes and is not rinsed out. The CHG product catching fire. Make sure that you avoid smoking and flames after applying CHG to your skin. Do not use CHG: If you have a chlorhexidine  allergy or have previously reacted to chlorhexidine. On babies younger than 76 months of age. How to use CHG solution Use CHG only as told by your health care provider, and follow the instructions on the label. Use the full amount of CHG as directed. Usually, this is one bottle. During a shower Follow these steps when using CHG solution during a shower (unless your health care provider gives you different instructions): Start the shower. Use your normal soap and shampoo to wash your face and hair. Turn off the shower or move out of the shower stream. Pour the CHG onto a clean washcloth. Do not use any type of brush or rough-edged sponge. Starting at your neck, lather your body down to your toes. Make sure you follow these instructions: If you will be having surgery, pay special attention to the part of your body where you will be having surgery. Scrub this area for at least 1 minute. Do not use CHG on your head or face. If the solution gets into your ears or eyes, rinse them well with water. Avoid your genital area. Avoid any areas of skin that have broken skin, cuts, or scrapes. Scrub your back and under your arms. Make sure to wash skin folds. Let the lather sit on your skin for 1-2 minutes or as long as told by your health care provider. Thoroughly rinse your entire body in the shower. Make sure that all body creases and crevices are rinsed well. Dry off with a clean towel. Do not put any substances on your body afterward--such as powder, lotion, or perfume--unless you are told to do so by your health care provider. Only use lotions that are recommended by the manufacturer. Put on clean clothes or pajamas. If it is the night before your surgery, sleep in clean sheets.  During a sponge bath Follow these steps when using CHG solution during a sponge bath (unless your health care provider gives you different instructions): Use your normal soap and shampoo to wash your face and hair. Pour the  CHG onto a clean washcloth. Starting at your neck, lather  your body down to your toes. Make sure you follow these instructions: If you will be having surgery, pay special attention to the part of your body where you will be having surgery. Scrub this area for at least 1 minute. Do not use CHG on your head or face. If the solution gets into your ears or eyes, rinse them well with water. Avoid your genital area. Avoid any areas of skin that have broken skin, cuts, or scrapes. Scrub your back and under your arms. Make sure to wash skin folds. Let the lather sit on your skin for 1-2 minutes or as long as told by your health care provider. Using a different clean, wet washcloth, thoroughly rinse your entire body. Make sure that all body creases and crevices are rinsed well. Dry off with a clean towel. Do not put any substances on your body afterward--such as powder, lotion, or perfume--unless you are told to do so by your health care provider. Only use lotions that are recommended by the manufacturer. Put on clean clothes or pajamas. If it is the night before your surgery, sleep in clean sheets. How to use CHG prepackaged cloths Only use CHG cloths as told by your health care provider, and follow the instructions on the label. Use the CHG cloth on clean, dry skin. Do not use the CHG cloth on your head or face unless your health care provider tells you to. When washing with the CHG cloth: Avoid your genital area. Avoid any areas of skin that have broken skin, cuts, or scrapes. Before surgery Follow these steps when using a CHG cloth to clean before surgery (unless your health care provider gives you different instructions): Using the CHG cloth, vigorously scrub the part of your body where you will be having surgery. Scrub using a back-and-forth motion for 3 minutes. The area on your body should be completely wet with CHG when you are done scrubbing. Do not rinse. Discard the cloth and let the area  air-dry. Do not put any substances on the area afterward, such as powder, lotion, or perfume. Put on clean clothes or pajamas. If it is the night before your surgery, sleep in clean sheets.  For general bathing Follow these steps when using CHG cloths for general bathing (unless your health care provider gives you different instructions). Use a separate CHG cloth for each area of your body. Make sure you wash between any folds of skin and between your fingers and toes. Wash your body in the following order, switching to a new cloth after each step: The front of your neck, shoulders, and chest. Both of your arms, under your arms, and your hands. Your stomach and groin area, avoiding the genitals. Your right leg and foot. Your left leg and foot. The back of your neck, your back, and your buttocks. Do not rinse. Discard the cloth and let the area air-dry. Do not put any substances on your body afterward--such as powder, lotion, or perfume--unless you are told to do so by your health care provider. Only use lotions that are recommended by the manufacturer. Put on clean clothes or pajamas. Contact a health care provider if: Your skin gets irritated after scrubbing. You have questions about using your solution or cloth. You swallow any chlorhexidine. Call your local poison control center ([phone] in the [place] Get help right away if: Your eyes itch badly, or they become very red or swollen. Your skin itches badly and is red or swollen. Your hearing changes. You  have trouble seeing. You have swelling or tingling in your mouth or throat. You have trouble breathing. These symptoms may represent a serious problem that is an emergency. Do not wait to see if the symptoms will go away. Get medical help right away. Call your local emergency services (911 in the [place] Do not drive yourself to the hospital. Summary Chlorhexidine gluconate (CHG) is a germ-killing (antiseptic) solution that is used  to clean the skin. Cleaning your skin with CHG may help to lower your risk for infection. You may be given CHG to use for bathing. It may be in a bottle or in a prepackaged cloth to use on your skin. Carefully follow your health care provider's instructions and the instructions on the product label. Do not use CHG if you have a chlorhexidine allergy. Contact your health care provider if your skin gets irritated after scrubbing. This information is not intended to replace advice given to you by your health care provider. Make sure you discuss any questions you have with your health care provider. Document Revised: 06/05/2021 Document Reviewed: 04/18/2020 Elsevier Patient Education  [place].

## 2022-01-15 ENCOUNTER — Encounter (HOSPITAL_COMMUNITY): Payer: Self-pay

## 2022-01-15 ENCOUNTER — Encounter (HOSPITAL_COMMUNITY)
Admission: RE | Admit: 2022-01-15 | Discharge: 2022-01-15 | Disposition: A | Discharge status: Home / Self Care | Payer: No Typology Code available for payment source | Source: Ambulatory Visit | Attending: Obstetrics & Gynecology | Admitting: Obstetrics & Gynecology

## 2022-01-15 ENCOUNTER — Other Ambulatory Visit: Payer: Self-pay

## 2022-01-15 VITALS — BP 136/98 | HR 76 | Temp 97.5°F | Resp 18 | Ht 69.0 in | Wt 282.0 lb

## 2022-01-15 DIAGNOSIS — R102 Pelvic and perineal pain: Secondary | ICD-10-CM | POA: Insufficient documentation

## 2022-01-15 DIAGNOSIS — Z01818 Encounter for other preprocedural examination: Secondary | ICD-10-CM | POA: Insufficient documentation

## 2022-01-15 DIAGNOSIS — I10 Essential (primary) hypertension: Secondary | ICD-10-CM | POA: Insufficient documentation

## 2022-01-15 LAB — TYPE AND SCREEN
ABO/RH(D): A POS
Antibody Screen: NEGATIVE

## 2022-01-15 LAB — CBC
HCT: 43.1 % (ref 36.0–46.0)
Hemoglobin: 15 g/dL (ref 12.0–15.0)
MCH: 32.6 pg (ref 26.0–34.0)
MCHC: 34.8 g/dL (ref 30.0–36.0)
MCV: 93.7 fL (ref 80.0–100.0)
Platelets: 321 10*3/uL (ref 150–400)
RBC: 4.6 MIL/uL (ref 3.87–5.11)
RDW: 12.9 % (ref 11.5–15.5)
WBC: 7.2 10*3/uL (ref 4.0–10.5)
nRBC: 0 % (ref 0.0–0.2)

## 2022-01-15 LAB — COMPREHENSIVE METABOLIC PANEL
ALT: 31 U/L (ref 0–44)
AST: 22 U/L (ref 15–41)
Albumin: 4 g/dL (ref 3.5–5.0)
Alkaline Phosphatase: 45 U/L (ref 38–126)
Anion gap: 9 (ref 5–15)
BUN: 24 mg/dL — ABNORMAL HIGH (ref 6–20)
CO2: 23 mmol/L (ref 22–32)
Calcium: 9.1 mg/dL (ref 8.9–10.3)
Chloride: 106 mmol/L (ref 98–111)
Creatinine, Ser: 0.9 mg/dL (ref 0.44–1.00)
GFR, Estimated: >60 mL/min (ref >60)
Glucose, Bld: 95 mg/dL (ref 70–99)
Potassium: 3.4 mmol/L — ABNORMAL LOW (ref 3.5–5.1)
Sodium: 138 mmol/L (ref 135–145)
Total Bilirubin: 0.6 mg/dL (ref 0.3–1.2)
Total Protein: 6.8 g/dL (ref 6.5–8.1)

## 2022-01-15 LAB — PREGNANCY, URINE: Preg Test, Ur: NEGATIVE

## 2022-01-16 NOTE — H&P (Signed)
[facility] Attending History and Physical  Carol Sharp is a 46 y.o. [phone] who presents for scheduled total abdominal hysterectomy and bilateral salpingectomy due to chronic pelvic pain and uterine fibroid.  In review, this has been an ongoing issue since the summer.  Notes pelvic pressure mostly on her right side.  She has tried OTC treatment as well as Myfembree with minimal improvement.  Additionally, despite endometrial ablation, she has noted return of her menses.  Denies any abnormal vaginal discharge, fevers, chills, sweats, dysuria, nausea, vomiting, other GI or GU symptoms or other general symptoms.  Past Medical History:  Diagnosis Date   Anxiety    Arthritis    Breast nodule 08/02/2015   Depression    Hypertension    MRSA cellulitis 06/2003   PONV (postoperative nausea and vomiting)    Past Surgical History:  Procedure Laterality Date   ABLATION  06/19/2008   CESAREAN SECTION     CHOLECYSTECTOMY     COLONOSCOPY WITH PROPOFOL N/A 02/16/2021   Procedure: COLONOSCOPY WITH PROPOFOL;  Surgeon: Eloise Harman, DO;  Location: [facility];  Service: Endoscopy;  Laterality: N/A;  1:00pm   DILATION AND CURETTAGE OF UTERUS  11/20/1998   INCISIONAL HERNIA REPAIR N/A 03/24/2021   Procedure: LAPAROSCOPIC INCISIONAL HERNIA W/MESH;  Surgeon: Aviva Signs, MD;  Location: [facility];  Service: General;  Laterality: N/A;   KNEE ARTHROSCOPY WITH MEDIAL MENISECTOMY Right 04/18/2017   Procedure: RIGHT KNEE ARTHROSCOPY WITH PARTIAL  MEDIAL MENISECTOMY;  Surgeon: Carole Civil, MD;  Location: [facility];  Service: Orthopedics;  Laterality: Right;   TONSILLECTOMY  1981?   TUBAL LIGATION     OB History  Gravida Para Term Preterm AB Living  '3 2     1 2  '$ SAB IAB Ectopic Multiple Live Births  1            # Outcome Date GA Lbr Len/2nd Weight Sex Delivery Anes PTL Lv  3 SAB           2 Para           1 Para           Patient denies any other pertinent  gynecologic issues.  No current facility-administered medications on file prior to encounter.   Current Outpatient Medications on File Prior to Encounter  Medication Sig Dispense Refill   ALPRAZolam (XANAX) 1 MG tablet Take 1 tablet by mouth once daily as needed 30 tablet 5   chlorthalidone (HYGROTON) 50 MG tablet TAKE 1 TABLET BY MOUTH ONCE A DAY. 90 tablet 1   Evolocumab (REPATHA) 140 MG/ML SOSY 140 mg subcutaneous q 2 weeks 2.1 mL 6   meloxicam (MOBIC) 15 MG tablet TAKE ONE TABLET BY MOUTH ONCE DAILY 90 tablet 1   Multiple Vitamins-Minerals (CENTRUM SILVER ADULT 50+ PO) Take 1 tablet by mouth daily.     sertraline (ZOLOFT) 100 MG tablet Take 1 tablet (100 mg total) by mouth daily. 90 tablet 1   valACYclovir (VALTREX) 1000 MG tablet 1 qd (Patient taking differently: Take 1,000 mg by mouth daily as needed (outbreak).) 90 tablet 1   valsartan (DIOVAN) 80 MG tablet TAKE 1 TABLET BY MOUTH ONCE A DAY. 90 tablet 1   ondansetron (ZOFRAN) 4 MG tablet Take 1 tablet (4 mg total) by mouth every 8 (eight) hours as needed for nausea or vomiting. (Patient not taking: Reported on 01/05/2022) 20 tablet 0   Relugolix-Estradiol-Norethind (MYFEMBREE) 40-1-0.5 MG TABS Take 1 tablet  by mouth daily. (Patient not taking: Reported on 12/14/2021) 28 tablet 11   Allergies  Allergen Reactions   Lipitor [Atorvastatin] Other (See Comments)    Joint pains severe   Amlodipine Swelling    Social History:   reports that she has been smoking cigarettes. She has a 7.50 pack-year smoking history. She has never used smokeless tobacco. She reports current alcohol use. She reports that she does not use drugs. Family History  Problem Relation Age of Onset   Cancer Paternal Grandmother        breast   Cancer Maternal Grandmother        colon   Hyperlipidemia Father    Hypertension Mother    Diabetes Mother    Hyperlipidemia Mother    Thyroid disease Mother    Seizures Mother    Cancer Paternal Aunt        breast    Cancer Paternal Aunt        breast   Cancer Paternal Aunt        breast    Review of Systems: Pertinent items noted in HPI and remainder of comprehensive ROS otherwise negative.  PHYSICAL EXAM: Blood pressure 114/80, pulse 76, temperature 97.9 F (36.6 C), resp. rate 15, SpO2 99 %. CONSTITUTIONAL: Well-developed, well-nourished female in no acute distress.  SKIN: Skin is warm and dry. No rash noted. Not diaphoretic. No erythema. No pallor. NEUROLOGIC: Alert and oriented to person, place, and time. Normal reflexes, muscle tone coordination. No cranial nerve deficit noted. PSYCHIATRIC: Normal mood and affect. Normal behavior. Normal judgment and thought content. CARDIOVASCULAR: Normal heart rate noted, regular rhythm RESPIRATORY: Effort and breath sounds normal, no problems with respiration noted ABDOMEN: Soft, nontender, nondistended. PELVIC: deferred MUSCULOSKELETAL: no calf tenderness bilaterally EXT: no edema bilaterally, normal pulses  Labs: Results for orders placed or performed during the hospital encounter of 01/17/22 (from the past 336 hour(s))  ABO/Rh   Collection Time: 01/17/22  6:50 AM  Result Value Ref Range   ABO/RH(D)      A POS Performed at [facility], [address], [place], [place] [address]   Results for orders placed or performed during the hospital encounter of 01/15/22 (from the past 336 hour(s))  CBC   Collection Time: 01/15/22  8:41 AM  Result Value Ref Range   WBC 7.2 4.0 - 10.5 K/uL   RBC 4.60 3.87 - 5.11 MIL/uL   Hemoglobin 15.0 12.0 - 15.0 g/dL   HCT 43.1 36.0 - 46.0 %   MCV 93.7 80.0 - 100.0 fL   MCH 32.6 26.0 - 34.0 pg   MCHC 34.8 30.0 - 36.0 g/dL   RDW 12.9 11.5 - 15.5 %   Platelets 321 150 - 400 K/uL   nRBC 0.0 0.0 - 0.2 %  Comprehensive metabolic panel   Collection Time: 01/15/22  8:41 AM  Result Value Ref Range   Sodium 138 135 - 145 mmol/L   Potassium 3.4 (L) 3.5 - 5.1 mmol/L   Chloride 106 98 - 111 mmol/L   CO2 23 22 - 32 mmol/L    Glucose, Bld 95 70 - 99 mg/dL   BUN 24 (H) 6 - 20 mg/dL   Creatinine, Ser 0.90 0.44 - 1.00 mg/dL   Calcium 9.1 8.9 - 10.3 mg/dL   Total Protein 6.8 6.5 - 8.1 g/dL   Albumin 4.0 3.5 - 5.0 g/dL   AST 22 15 - 41 U/L   ALT 31 0 - 44 U/L   Alkaline Phosphatase 45  38 - 126 U/L   Total Bilirubin 0.6 0.3 - 1.2 mg/dL   GFR, Estimated >60 >60 mL/min   Anion gap 9 5 - 15  Pregnancy, urine   Collection Time: 01/15/22  8:41 AM  Result Value Ref Range   Preg Test, Ur NEGATIVE NEGATIVE  Type and screen   Collection Time: 01/15/22  8:41 AM  Result Value Ref Range   ABO/RH(D) A POS    Antibody Screen NEG    Sample Expiration 01/29/2022,2359    Extend sample reason      NO TRANSFUSIONS OR PREGNANCY IN THE PAST 3 MONTHS Performed at [facility], [address], [place], [place] [address]     Imaging Studies: [place] last completed 09/2021:  12.9 x 10.1 x 9.4 cm = volume: 641 mL. Anteverted. Heterogeneous myometrium. Large mass at fundus and upper uterine segment 7.9 x 7.8 x 9.5 cm most consistent with a uterine leiomyoma.   Assessment: Pelvic pain Uterine fibroid S/p endometrial ablation  Plan: Total abdominal hysterectomy and bilateral salpingectomy -Ancef 2g IV -Toradol '30mg'$  -NPO -LR @ 125cc/hr -SCDs to OR -Risk/benefits and alternatives reviewed with the patient including but not limited to risk of bleeding, infection and injury to surrounding organs.  Questions and concerns were addressed and pt desires to proceed  Janyth Pupa, DO Attending [place], [facility] for [facility], [place]

## 2022-01-17 ENCOUNTER — Ambulatory Visit (HOSPITAL_COMMUNITY): Payer: No Typology Code available for payment source | Admitting: Anesthesiology

## 2022-01-17 ENCOUNTER — Inpatient Hospital Stay (HOSPITAL_COMMUNITY)
Admission: AD | Admit: 2022-01-17 | Discharge: 2022-01-18 | DRG: 742 | Disposition: A | Discharge status: Home / Self Care | Payer: No Typology Code available for payment source | Attending: Obstetrics & Gynecology | Admitting: Obstetrics & Gynecology

## 2022-01-17 ENCOUNTER — Other Ambulatory Visit: Payer: Self-pay

## 2022-01-17 ENCOUNTER — Encounter (HOSPITAL_COMMUNITY): Payer: Self-pay | Admitting: Obstetrics & Gynecology

## 2022-01-17 ENCOUNTER — Encounter (HOSPITAL_COMMUNITY)
Admission: AD | Disposition: A | Discharge status: Home / Self Care | Payer: Self-pay | Source: Home / Self Care | Attending: Obstetrics & Gynecology

## 2022-01-17 DIAGNOSIS — F17210 Nicotine dependence, cigarettes, uncomplicated: Secondary | ICD-10-CM | POA: Diagnosis present

## 2022-01-17 DIAGNOSIS — F418 Other specified anxiety disorders: Secondary | ICD-10-CM | POA: Diagnosis not present

## 2022-01-17 DIAGNOSIS — I10 Essential (primary) hypertension: Secondary | ICD-10-CM | POA: Diagnosis present

## 2022-01-17 DIAGNOSIS — F419 Anxiety disorder, unspecified: Secondary | ICD-10-CM | POA: Diagnosis present

## 2022-01-17 DIAGNOSIS — Z800 Family history of malignant neoplasm of digestive organs: Secondary | ICD-10-CM | POA: Diagnosis not present

## 2022-01-17 DIAGNOSIS — F32A Depression, unspecified: Secondary | ICD-10-CM | POA: Diagnosis present

## 2022-01-17 DIAGNOSIS — G8929 Other chronic pain: Secondary | ICD-10-CM | POA: Diagnosis present

## 2022-01-17 DIAGNOSIS — D259 Leiomyoma of uterus, unspecified: Secondary | ICD-10-CM | POA: Diagnosis present

## 2022-01-17 DIAGNOSIS — Z833 Family history of diabetes mellitus: Secondary | ICD-10-CM | POA: Diagnosis not present

## 2022-01-17 DIAGNOSIS — Z8349 Family history of other endocrine, nutritional and metabolic diseases: Secondary | ICD-10-CM | POA: Diagnosis not present

## 2022-01-17 DIAGNOSIS — Z803 Family history of malignant neoplasm of breast: Secondary | ICD-10-CM | POA: Diagnosis not present

## 2022-01-17 DIAGNOSIS — D251 Intramural leiomyoma of uterus: Secondary | ICD-10-CM | POA: Diagnosis not present

## 2022-01-17 DIAGNOSIS — Z01818 Encounter for other preprocedural examination: Principal | ICD-10-CM

## 2022-01-17 DIAGNOSIS — Z8249 Family history of ischemic heart disease and other diseases of the circulatory system: Secondary | ICD-10-CM | POA: Diagnosis not present

## 2022-01-17 DIAGNOSIS — Z79899 Other long term (current) drug therapy: Secondary | ICD-10-CM | POA: Diagnosis not present

## 2022-01-17 DIAGNOSIS — R102 Pelvic and perineal pain: Secondary | ICD-10-CM | POA: Diagnosis present

## 2022-01-17 DIAGNOSIS — Z83438 Family history of other disorder of lipoprotein metabolism and other lipidemia: Secondary | ICD-10-CM

## 2022-01-17 DIAGNOSIS — Z6841 Body Mass Index (BMI) 40.0 and over, adult: Secondary | ICD-10-CM | POA: Diagnosis not present

## 2022-01-17 DIAGNOSIS — D219 Benign neoplasm of connective and other soft tissue, unspecified: Secondary | ICD-10-CM

## 2022-01-17 HISTORY — PX: ABDOMINAL HYSTERECTOMY: SHX81

## 2022-01-17 LAB — ABO/RH: ABO/RH(D): A POS

## 2022-01-17 SURGERY — HYSTERECTOMY, ABDOMINAL
Anesthesia: General

## 2022-01-17 MED ORDER — CEFAZOLIN IN SODIUM CHLORIDE 3-0.9 GM/100ML-% IV SOLN
3.0000 g | INTRAVENOUS | Status: AC
  Administered 2022-01-17: 3 g via INTRAVENOUS

## 2022-01-17 MED ORDER — SUGAMMADEX SODIUM 200 MG/2ML IV SOLN
INTRAVENOUS | Status: DC | PRN
  Administered 2022-01-17: 200 mg via INTRAVENOUS

## 2022-01-17 MED ORDER — MIDAZOLAM HCL 5 MG/5ML IJ SOLN
INTRAMUSCULAR | Status: DC | PRN
  Administered 2022-01-17: 2 mg via INTRAVENOUS

## 2022-01-17 MED ORDER — ONDANSETRON HCL 4 MG/2ML IJ SOLN
INTRAMUSCULAR | Status: AC
  Filled 2022-01-17: qty 2

## 2022-01-17 MED ORDER — LIDOCAINE HCL (PF) 2 % IJ SOLN
INTRAMUSCULAR | Status: AC
  Filled 2022-01-17: qty 5

## 2022-01-17 MED ORDER — MIDAZOLAM HCL 2 MG/2ML IJ SOLN
INTRAMUSCULAR | Status: AC
  Filled 2022-01-17: qty 2

## 2022-01-17 MED ORDER — MEPERIDINE HCL 50 MG/ML IJ SOLN: 6.2500 mg | INTRAMUSCULAR | Status: DC | PRN

## 2022-01-17 MED ORDER — SENNOSIDES-DOCUSATE SODIUM 8.6-50 MG PO TABS
1.0000 | ORAL_TABLET | Freq: Two times a day (BID) | ORAL | Status: DC
  Administered 2022-01-17 – 2022-01-18 (×3): 1 via ORAL
  Filled 2022-01-17 (×3): qty 1

## 2022-01-17 MED ORDER — CHLORHEXIDINE GLUCONATE 0.12 % MT SOLN
15.0000 mL | Freq: Once | OROMUCOSAL | Status: AC
  Administered 2022-01-17: 15 mL via OROMUCOSAL

## 2022-01-17 MED ORDER — KETOROLAC TROMETHAMINE 30 MG/ML IJ SOLN
INTRAMUSCULAR | Status: AC
  Filled 2022-01-17: qty 1

## 2022-01-17 MED ORDER — SODIUM CHLORIDE 0.9 % IV SOLN
25.0000 mg | Freq: Four times a day (QID) | INTRAVENOUS | Status: DC | PRN
  Administered 2022-01-17: 25 mg via INTRAVENOUS
  Filled 2022-01-17: qty 1

## 2022-01-17 MED ORDER — ROCURONIUM BROMIDE 10 MG/ML (PF) SYRINGE
PREFILLED_SYRINGE | INTRAVENOUS | Status: DC | PRN
  Administered 2022-01-17: 10 mg via INTRAVENOUS
  Administered 2022-01-17: 80 mg via INTRAVENOUS
  Administered 2022-01-17: 10 mg via INTRAVENOUS

## 2022-01-17 MED ORDER — PROMETHAZINE HCL 25 MG/ML IJ SOLN
INTRAMUSCULAR | Status: AC
  Filled 2022-01-17: qty 1

## 2022-01-17 MED ORDER — CEFAZOLIN IN SODIUM CHLORIDE 3-0.9 GM/100ML-% IV SOLN
INTRAVENOUS | Status: AC
  Filled 2022-01-17: qty 100

## 2022-01-17 MED ORDER — DEXAMETHASONE SODIUM PHOSPHATE 4 MG/ML IJ SOLN
INTRAMUSCULAR | Status: DC | PRN
  Administered 2022-01-17: 10 mg via INTRAVENOUS

## 2022-01-17 MED ORDER — EPHEDRINE 5 MG/ML INJ
INTRAVENOUS | Status: AC
  Filled 2022-01-17: qty 5

## 2022-01-17 MED ORDER — BUPIVACAINE-MELOXICAM ER 400-12 MG/14ML IJ SOLN
INTRAMUSCULAR | Status: DC | PRN
  Administered 2022-01-17: 7 mL

## 2022-01-17 MED ORDER — KETOROLAC TROMETHAMINE 30 MG/ML IJ SOLN
30.0000 mg | Freq: Four times a day (QID) | INTRAMUSCULAR | Status: DC
  Administered 2022-01-17 – 2022-01-18 (×3): 30 mg via INTRAVENOUS
  Filled 2022-01-17 (×3): qty 1

## 2022-01-17 MED ORDER — FENTANYL CITRATE (PF) 100 MCG/2ML IJ SOLN
INTRAMUSCULAR | Status: DC | PRN
  Administered 2022-01-17: 50 ug via INTRAVENOUS
  Administered 2022-01-17: 25 ug via INTRAVENOUS
  Administered 2022-01-17 (×2): 50 ug via INTRAVENOUS
  Administered 2022-01-17: 100 ug via INTRAVENOUS

## 2022-01-17 MED ORDER — LACTATED RINGERS IV SOLN: INTRAVENOUS | Status: DC

## 2022-01-17 MED ORDER — ONDANSETRON HCL 4 MG/2ML IJ SOLN
4.0000 mg | Freq: Four times a day (QID) | INTRAMUSCULAR | Status: DC | PRN
  Administered 2022-01-17 – 2022-01-18 (×2): 4 mg via INTRAVENOUS
  Filled 2022-01-17 (×2): qty 2

## 2022-01-17 MED ORDER — KETOROLAC TROMETHAMINE 30 MG/ML IJ SOLN: 30.0000 mg | Freq: Four times a day (QID) | INTRAMUSCULAR | Status: DC

## 2022-01-17 MED ORDER — DOCUSATE SODIUM 100 MG PO CAPS: 100.0000 mg | ORAL_CAPSULE | Freq: Two times a day (BID) | ORAL | 0 refills | Status: DC

## 2022-01-17 MED ORDER — ONDANSETRON HCL 4 MG PO TABS: 4.0000 mg | ORAL_TABLET | Freq: Four times a day (QID) | ORAL | Status: DC | PRN

## 2022-01-17 MED ORDER — ROCURONIUM BROMIDE 10 MG/ML (PF) SYRINGE
PREFILLED_SYRINGE | INTRAVENOUS | Status: AC
  Filled 2022-01-17: qty 10

## 2022-01-17 MED ORDER — GABAPENTIN 300 MG PO CAPS: 300.0000 mg | ORAL_CAPSULE | Freq: Three times a day (TID) | ORAL | 0 refills | Status: DC

## 2022-01-17 MED ORDER — FENTANYL CITRATE (PF) 250 MCG/5ML IJ SOLN
INTRAMUSCULAR | Status: AC
  Filled 2022-01-17: qty 5

## 2022-01-17 MED ORDER — CHLORTHALIDONE 25 MG PO TABS
50.0000 mg | ORAL_TABLET | Freq: Every day | ORAL | Status: DC
  Administered 2022-01-17: 50 mg via ORAL
  Filled 2022-01-17 (×2): qty 2

## 2022-01-17 MED ORDER — SCOPOLAMINE 1 MG/3DAYS TD PT72
1.0000 | MEDICATED_PATCH | Freq: Once | TRANSDERMAL | Status: DC
  Administered 2022-01-17: 1.5 mg via TRANSDERMAL

## 2022-01-17 MED ORDER — KETOROLAC TROMETHAMINE 30 MG/ML IJ SOLN
30.0000 mg | Freq: Once | INTRAMUSCULAR | Status: AC
  Administered 2022-01-17: 30 mg via INTRAVENOUS

## 2022-01-17 MED ORDER — ORAL CARE MOUTH RINSE: 15.0000 mL | Freq: Once | OROMUCOSAL | Status: AC

## 2022-01-17 MED ORDER — CHLORTHALIDONE 25 MG PO TABS: 50.0000 mg | ORAL_TABLET | Freq: Every day | ORAL | Status: DC

## 2022-01-17 MED ORDER — ONDANSETRON 4 MG PO TBDP: 4.0000 mg | ORAL_TABLET | Freq: Three times a day (TID) | ORAL | 0 refills | Status: DC | PRN

## 2022-01-17 MED ORDER — PROPOFOL 10 MG/ML IV BOLUS
INTRAVENOUS | Status: DC | PRN
  Administered 2022-01-17: 200 mg via INTRAVENOUS

## 2022-01-17 MED ORDER — DEXAMETHASONE SODIUM PHOSPHATE 10 MG/ML IJ SOLN
INTRAMUSCULAR | Status: AC
  Filled 2022-01-17: qty 1

## 2022-01-17 MED ORDER — LIDOCAINE HCL (CARDIAC) PF 100 MG/5ML IV SOSY
PREFILLED_SYRINGE | INTRAVENOUS | Status: DC | PRN
  Administered 2022-01-17: 100 mg via INTRAVENOUS

## 2022-01-17 MED ORDER — POVIDONE-IODINE 10 % EX SWAB: 2.0000 | Freq: Once | CUTANEOUS | Status: DC

## 2022-01-17 MED ORDER — MENTHOL 3 MG MT LOZG: 1.0000 | LOZENGE | OROMUCOSAL | Status: DC | PRN

## 2022-01-17 MED ORDER — OXYCODONE HCL 5 MG PO TABS
5.0000 mg | ORAL_TABLET | ORAL | Status: DC | PRN
  Administered 2022-01-17 – 2022-01-18 (×5): 10 mg via ORAL
  Filled 2022-01-17 (×5): qty 2

## 2022-01-17 MED ORDER — ONDANSETRON HCL 4 MG/2ML IJ SOLN: 4.0000 mg | Freq: Once | INTRAMUSCULAR | Status: DC | PRN

## 2022-01-17 MED ORDER — HYDROMORPHONE HCL 1 MG/ML IJ SOLN
0.2500 mg | INTRAMUSCULAR | Status: AC | PRN
  Administered 2022-01-17 (×4): 0.5 mg via INTRAVENOUS
  Filled 2022-01-17 (×4): qty 0.5

## 2022-01-17 MED ORDER — IRBESARTAN 150 MG PO TABS
75.0000 mg | ORAL_TABLET | Freq: Every day | ORAL | Status: DC
  Administered 2022-01-17: 75 mg via ORAL
  Filled 2022-01-17 (×2): qty 1

## 2022-01-17 MED ORDER — SCOPOLAMINE 1 MG/3DAYS TD PT72
MEDICATED_PATCH | TRANSDERMAL | Status: AC
  Filled 2022-01-17: qty 1

## 2022-01-17 MED ORDER — SEVOFLURANE IN SOLN
RESPIRATORY_TRACT | Status: AC
  Filled 2022-01-17: qty 250

## 2022-01-17 MED ORDER — IBUPROFEN 600 MG PO TABS: 600.0000 mg | ORAL_TABLET | Freq: Four times a day (QID) | ORAL | 0 refills | Status: DC | PRN

## 2022-01-17 MED ORDER — EPHEDRINE SULFATE-NACL 50-0.9 MG/10ML-% IV SOSY
PREFILLED_SYRINGE | INTRAVENOUS | Status: DC | PRN
  Administered 2022-01-17 (×4): 5 mg via INTRAVENOUS

## 2022-01-17 MED ORDER — ALUM & MAG HYDROXIDE-SIMETH 200-200-20 MG/5ML PO SUSP: 30.0000 mL | ORAL | Status: DC | PRN

## 2022-01-17 MED ORDER — KETOROLAC TROMETHAMINE 10 MG PO TABS: 10.0000 mg | ORAL_TABLET | Freq: Three times a day (TID) | ORAL | 0 refills | Status: AC

## 2022-01-17 MED ORDER — SODIUM CHLORIDE FLUSH 0.9 % IV SOLN
INTRAVENOUS | Status: AC
  Filled 2022-01-17: qty 10

## 2022-01-17 MED ORDER — PROMETHAZINE HCL 25 MG/ML IJ SOLN
6.2500 mg | INTRAMUSCULAR | Status: DC | PRN
  Administered 2022-01-17: 6.25 mg via INTRAVENOUS

## 2022-01-17 MED ORDER — HYDROMORPHONE HCL 1 MG/ML IJ SOLN
1.0000 mg | Freq: Once | INTRAMUSCULAR | Status: AC
  Administered 2022-01-17: 1 mg via INTRAVENOUS

## 2022-01-17 MED ORDER — IRBESARTAN 150 MG PO TABS: 75.0000 mg | ORAL_TABLET | Freq: Every day | ORAL | Status: DC

## 2022-01-17 MED ORDER — FENTANYL CITRATE (PF) 100 MCG/2ML IJ SOLN
INTRAMUSCULAR | Status: AC
  Filled 2022-01-17: qty 2

## 2022-01-17 MED ORDER — ACETAMINOPHEN 10 MG/ML IV SOLN
1000.0000 mg | Freq: Once | INTRAVENOUS | Status: AC
  Administered 2022-01-17: 1000 mg via INTRAVENOUS
  Filled 2022-01-17: qty 100

## 2022-01-17 MED ORDER — OXYCODONE HCL 5 MG PO TABS: 5.0000 mg | ORAL_TABLET | Freq: Four times a day (QID) | ORAL | 0 refills | Status: DC | PRN

## 2022-01-17 MED ORDER — KETOROLAC TROMETHAMINE 15 MG/ML IJ SOLN
INTRAMUSCULAR | Status: AC
  Filled 2022-01-17: qty 1

## 2022-01-17 MED ORDER — 0.9 % SODIUM CHLORIDE (POUR BTL) OPTIME
TOPICAL | Status: DC | PRN
  Administered 2022-01-17 (×2): 1000 mL

## 2022-01-17 MED ORDER — HYDROMORPHONE HCL 1 MG/ML IJ SOLN
INTRAMUSCULAR | Status: AC
  Filled 2022-01-17: qty 1

## 2022-01-17 MED ORDER — SIMETHICONE 80 MG PO CHEW: 80.0000 mg | CHEWABLE_TABLET | Freq: Four times a day (QID) | ORAL | Status: DC | PRN

## 2022-01-17 MED ORDER — SERTRALINE HCL 50 MG PO TABS
100.0000 mg | ORAL_TABLET | Freq: Every day | ORAL | Status: DC
  Administered 2022-01-18: 100 mg via ORAL
  Filled 2022-01-17: qty 2

## 2022-01-17 MED ORDER — ONDANSETRON HCL 4 MG/2ML IJ SOLN
INTRAMUSCULAR | Status: DC | PRN
  Administered 2022-01-17: 4 mg via INTRAVENOUS

## 2022-01-17 MED ORDER — KETOROLAC TROMETHAMINE 15 MG/ML IJ SOLN
30.0000 mg | INTRAMUSCULAR | Status: AC
  Filled 2022-01-17: qty 2

## 2022-01-17 SURGICAL SUPPLY — 44 items
ADH SKN CLS APL DERMABOND .7 (GAUZE/BANDAGES/DRESSINGS) ×1
APL SWBSTK 6 STRL LF DISP (MISCELLANEOUS)
APPLICATOR COTTON TIP 6 STRL (MISCELLANEOUS) ×2 IMPLANT
APPLICATOR COTTON TIP 6IN STRL (MISCELLANEOUS)
CLOTH BEACON ORANGE TIMEOUT ST (SAFETY) ×2 IMPLANT
COVER LIGHT HANDLE STERIS (MISCELLANEOUS) ×4 IMPLANT
DERMABOND ADVANCED .7 DNX12 (GAUZE/BANDAGES/DRESSINGS) ×2 IMPLANT
DRAPE LAPAROTOMY TRNSV 102X78 (DRAPES) ×2 IMPLANT
DRAPE WARM FLUID 44X44 (DRAPES) IMPLANT
DRSG OPSITE POSTOP 4X10 (GAUZE/BANDAGES/DRESSINGS) IMPLANT
DRSG OPSITE POSTOP 4X8 (GAUZE/BANDAGES/DRESSINGS) IMPLANT
DURAPREP 26ML APPLICATOR (WOUND CARE) ×2 IMPLANT
GAUZE 4X4 16PLY ~~LOC~~+RFID DBL (SPONGE) ×4 IMPLANT
GLOVE BIO SURGEON STRL SZ 6.5 (GLOVE) ×2 IMPLANT
GLOVE BIOGEL PI IND STRL 7.0 (GLOVE) ×6 IMPLANT
GOWN STRL REUS W/ TWL LRG LVL3 (GOWN DISPOSABLE) ×2 IMPLANT
GOWN STRL REUS W/TWL LRG LVL3 (GOWN DISPOSABLE) ×3 IMPLANT
GOWN STRL REUS W/TWL XL LVL3 (GOWN DISPOSABLE) ×2 IMPLANT
KIT BLADEGUARD II DBL (SET/KITS/TRAYS/PACK) ×2 IMPLANT
KIT TURNOVER KIT A (KITS) ×2 IMPLANT
NS IRRIG 1000ML POUR BTL (IV SOLUTION) ×2 IMPLANT
PACK ABDOMINAL MAJOR (CUSTOM PROCEDURE TRAY) ×2 IMPLANT
PAD ARMBOARD 7.5X6 YLW CONV (MISCELLANEOUS) ×2 IMPLANT
PENCIL SMOKE EVACUATOR (MISCELLANEOUS) ×2 IMPLANT
POWDER SURGICEL 3.0 GRAM (HEMOSTASIS) IMPLANT
RETRACTOR TRAXI PANNICULUS (MISCELLANEOUS) IMPLANT
RETRACTOR WOUND ALXS 18CM MED (MISCELLANEOUS) ×2 IMPLANT
RTRCTR WOUND ALEXIS O 18CM MED (MISCELLANEOUS) ×1
SET BASIN LINEN APH (SET/KITS/TRAYS/PACK) ×4 IMPLANT
SOL PREP POV-IOD 4OZ 10% (MISCELLANEOUS) ×2 IMPLANT
SPONGE T-LAP 18X18 ~~LOC~~+RFID (SPONGE) ×4 IMPLANT
SUT PLAIN CT 1/2CIR 2-0 27IN (SUTURE) ×4 IMPLANT
SUT VIC AB 0 CT1 27 (SUTURE) ×3
SUT VIC AB 0 CT1 27XCR 8 STRN (SUTURE) ×4 IMPLANT
SUT VIC AB 0 CT1 36 (SUTURE) ×2 IMPLANT
SUT VIC AB 0 CTX 36 (SUTURE)
SUT VIC AB 0 CTX36XBRD ANTBCTR (SUTURE) IMPLANT
SUT VIC AB 2-0 CT1 27 (SUTURE)
SUT VIC AB 2-0 CT1 TAPERPNT 27 (SUTURE) IMPLANT
SUT VICRYL 0 TIES 12 18 (SUTURE) IMPLANT
SUT VICRYL 3 0 (SUTURE) ×2 IMPLANT
TOWEL ~~LOC~~+RFID 17X26 BLUE (SPONGE) ×2 IMPLANT
TRAY FOLEY W/BAG SLVR 16FR (SET/KITS/TRAYS/PACK) ×1
TRAY FOLEY W/BAG SLVR 16FR ST (SET/KITS/TRAYS/PACK) ×2 IMPLANT

## 2022-01-17 NOTE — Anesthesia Preprocedure Evaluation (Signed)
Anesthesia Evaluation  Patient identified by MRN, date of birth, ID band Patient awake    Reviewed: Allergy & Precautions, H&P , NPO status , Patient's Chart, lab work & pertinent test results  History of Anesthesia Complications (+) PONV, DIFFICULT AIRWAY and history of anesthetic complications  Airway Mallampati: II  TM Distance: >3 FB Neck ROM: Full    Dental  (+) Dental Advisory Given   Pulmonary Current Smoker and Patient abstained from smoking.   Pulmonary exam normal breath sounds clear to auscultation       Cardiovascular Exercise Tolerance: Good hypertension, Pt. on medications Normal cardiovascular exam Rhythm:Regular Rate:Normal     Neuro/Psych  PSYCHIATRIC DISORDERS Anxiety Depression     Neuromuscular disease    GI/Hepatic negative GI ROS, Neg liver ROS,,,  Endo/Other    Morbid obesity  Renal/GU negative Renal ROS  negative genitourinary   Musculoskeletal  (+) Arthritis , Osteoarthritis,    Abdominal   Peds negative pediatric ROS (+)  Hematology negative hematology ROS (+)   Anesthesia Other Findings   Reproductive/Obstetrics negative OB ROS                             Anesthesia Physical Anesthesia Plan  ASA: 3  Anesthesia Plan: General   Post-op Pain Management: Dilaudid IV   Induction: Intravenous  PONV Risk Score and Plan: 4 or greater and Ondansetron, Dexamethasone, Midazolam and Scopolamine patch - Pre-op  Airway Management Planned: Oral ETT and Video Laryngoscope Planned  Additional Equipment:   Intra-op Plan:   Post-operative Plan: Extubation in OR  Informed Consent: I have reviewed the patients History and Physical, chart, labs and discussed the procedure including the risks, benefits and alternatives for the proposed anesthesia with the patient or authorized representative who has indicated his/her understanding and acceptance.     Dental advisory  given  Plan Discussed with: CRNA and Surgeon  Anesthesia Plan Comments:         Anesthesia Quick Evaluation

## 2022-01-17 NOTE — Anesthesia Procedure Notes (Signed)
Procedure Name: Intubation Date/Time: 01/17/2022 7:46 AM  Performed by: Lieutenant Diego, CRNAPre-anesthesia Checklist: Patient identified, Emergency Drugs available, Suction available and Patient being monitored Patient Re-evaluated:Patient Re-evaluated prior to induction Oxygen Delivery Method: Circle system utilized Preoxygenation: Pre-oxygenation with 100% oxygen Induction Type: IV induction Ventilation: Mask ventilation without difficulty Laryngoscope Size: Glidescope Grade View: Grade I Tube type: Oral Tube size: 7.0 mm Number of attempts: 1 Airway Equipment and Method: Stylet and Video-laryngoscopy Placement Confirmation: ETT inserted through vocal cords under direct vision, positive ETCO2 and breath sounds checked- equal and bilateral Secured at: 21 cm Tube secured with: Tape Dental Injury: Teeth and Oropharynx as per pre-operative assessment  Difficulty Due To: Difficulty was anticipated, Difficult Airway- due to large tongue and Difficult Airway- due to anterior larynx

## 2022-01-17 NOTE — Anesthesia Postprocedure Evaluation (Signed)
Anesthesia Post Note  Patient: Carol Sharp  Procedure(s) Performed: HYSTERECTOMY ABDOMINAL; TOTAL WITH BILATERAL SALPINGECTOMY  Patient location during evaluation: Phase II Anesthesia Type: General Level of consciousness: awake and alert and oriented Pain management: pain level controlled Vital Signs Assessment: post-procedure vital signs reviewed and stable Respiratory status: spontaneous breathing, nonlabored ventilation and respiratory function stable Cardiovascular status: blood pressure returned to baseline and stable Postop Assessment: no apparent nausea or vomiting Anesthetic complications: yes  Encounter Notable Events  Notable Event Outcome Phase Comment  Difficult to intubate - expected  Intraprocedure Filed from anesthesia note documentation.     Last Vitals:  Vitals:   01/17/22 1215 01/17/22 1230  BP: (!) 147/86 (!) 150/97  Pulse: 67 72  Resp:    Temp:    SpO2: 96% 93%    Last Pain:  Vitals:   01/17/22 1234  PainSc: 7                  Rashauna Tep C Krithik Mapel

## 2022-01-17 NOTE — Op Note (Signed)
OPERATIVE NOTE  Preoperative diagnosis:  1) pelvic pain 2) uterine fibroid                                          Postoperative diagnosis:  same as above  Procedure:  Total abdominal hysterectomy, bilateral salpingectomy  Surgeon:  Dr. Janyth Pupa  Assistant:  Dr. Darlis Loan  Anesthesia:  General endotracheal  Intraoperative findings: Enlarged boggy uterus with degenerating fibroid, normal tubes and ovaries bilaterally  EBL: 400cc IVF: 1000cc UOP: 500cc  Description of operation:    Patient was taken to the operating room and placed in the supine position where she underwent general endotracheal anesthesia.  She was then prepped and draped in the usual sterile fashion and a Foley catheter was placed for continuous bladder drainage.  A 6 cm transverse skin incision was made 1 fingerbreadth above the pubis and carried down sharply to the rectus fascia which was scored in the midline and extended laterally.  The fascia was taken off the muscles superiorly and inferiorly without difficulty.  The muscles were divided.  The peritoneal cavity was entered. A small Alexis self retaining retractor was placed. The upper abdomen was packed with wet lap pads.   The left side of the uterus was grasped with a Kocher. The left round ligament was suture ligated and cut with the electrocautery unit. An avascular window was made in the right utero ovarian ligament broad ligament.  The pedicle was double suture ligated and cut.  Due to the large size of the uterus, visualization was limited and plan was made to reduce the uterine size.  Using the bovie an incision was made into the uterine fundus and the degenerating fibroid was resected to decrease the mass of the uterus, allowing for delivery of the uterus through the incision.  Once delivered, better visualization was achieved.  In a similar fashion, the right round ligament was suture ligated and cut with the electrocautery unit. An avascular window was  made in the right utero ovarian ligament broad ligament.  The pedicle was double suture ligated and cut.   Serial pedicles were taken medial to the utero ovarian pedicle.  Each pedicle was clamped cut and transfixion suture ligated.   The vesicouterine plane was dissected.  The uterine vessels were clamped bilaterally,  then cut and suture ligated. Serial pedicles were taken down the cervix medial to the uterine vessels.  Each pedicle was clamped cut and suture ligated with good resulting hemostasis.  Due to the large size of the uterus, the bovie was used to incise the vaginal wall and the uterine body was removed and sent to pathology.  Several more pedicles were taken down the cervix medial to the uterine vessels.   The vagina was cross clamped and the cervix was removed and sent to pathology.  The vagina was closed in a running locked fashion with good hemostasis.     Attention was turned to the left adnexa.  The left fallopian tube was grasped with a babcock and using a Kelly clamp was cut, clamped and suture ligated.  A similar procedure was carried out on the right side.  Thus the ovaries were preserved. Both fallopian tubes were sent to pathology along with the other specimen.s     The pelvis was irrigated vigorously and all pedicles were examined and found to be hemostatic. All counts were correct at this  point x 3.  Surgicel  was used for additional hemostasis.  The muscles and peritoneum were reapproximated loosely.  The fascia was closed with 0 Vicryl running. Zyrnrelief was injected into the subcutaneous tissue for post operative pain management.   The skin was closed using 3-0 Vicryl on a Keith needle in a subcuticular fashion.  Dermabond was then applied for additional wound integrity and to serve as a postoperative bacterial barrier.    The patient was awakened from anesthesia taken to the recovery room in good stable condition. All sponge instrument and needle counts were correct x 3.  The  patient received Ancef and Toradol prophylactically preoperatively.  An experienced assistant was required given the standard of surgical care given the complexity of the case.  This assistant was needed for exposure, dissection, suctioning, retraction, instrument exchange and for overall help during the procedure.  Janyth Pupa, DO Attending [place], [facility] for [employer], [place]

## 2022-01-17 NOTE — Progress Notes (Signed)
Patient had emesis x one during shift, noted bile looking. Patient given PRN Zofran, see MAR. Patient reported was helpful. Patient blood pressure upon arrival to unit, patient had complaints of pain and had been ambulating to bathroom. Re-checked blood pressure was 171/92, patient reported she had no had any of her morning medications except her xanax. MD Ozan made aware. New orders placed.

## 2022-01-17 NOTE — Discharge Instructions (Addendum)
Post Operative Pain Med Plan:  >Take gabapentin 300 mg three times per day, as prescribed for 4 days, try to space them evenly  >Take the oxycodone- 1 tablet, on a schedule, around the clock, every 6 hours(set your phone alarm) for the first 2 days, there may be times when you will need 2 tablets but taking them on a schedule will decrease this need  >You can also take Tylenol together with the oxycodone.  If the oxycodone seems "to strong" then just take Tylenol  >Oxycodone will cause constipation, please be sure to take a stool softener (Colace) twice daily while taking this pain medication and/or continue this medication until your bowel regimen returns to normal  >Take the Toradol every 8 hours for the first 3 days then the remainder to supplement the pain as needed  >After the toradol is gone switch to Ibuprofen '600mg'$  every 6 hours as needed  If possible try to take the Toradol or Ibuprofen with food to help avoid upsetting your stomach  >Use a heating pad as well as needed  >I have also sent a prescription for zofran (ondansetron) for nausea to take if needed over the first couple of days  >Be gentle with your diet the first few days, liquids and soft non spicy food, fruits are great  >Get up and move, no lifting or straining HOME INSTRUCTIONS  Please note any unusual or excessive bleeding, pain, swelling. Mild dizziness or drowsiness are normal for about 24 hours after surgery.   Shower when comfortable  Restrictions: No driving for 24 hours or while taking pain medications.  Activity:  No heavy lifting (> 10 lbs), nothing in vagina (no tampons, douching, or intercourse) x 4 weeks; no tub baths for 4 weeks Vaginal spotting is expected but if your bleeding is heavy, period like,  please call the office   Incision: the bandaids will fall off when they are ready to; you may clean your incision with mild soap and water but do not rub or scrub the incision site.  You may  experience slight bloody drainage from your incision periodically.  This is normal.  If you experience a large amount of drainage or the incision opens, please call your physician who will likely direct you to the emergency department.  Diet:  You may return to your regular diet.  Do not eat large meals.  Eat small frequent meals throughout the day.  Continue to drink a good amount of water at least 6-8 glasses of water per day, hydration is very important for the healing process.  Pain Management: Follow the instructions as outlined above.  Always take prescription pain medication with food.  Oxycodone may cause constipation, you may want to take a stool softener while taking this medication.  A prescription of colace has been sent in to take twice daily if needed while taking the oxycodone.  Be sure to drink plenty of fluids and increase your fiber to help with constipation.  Alcohol -- Avoid for 24 hours and while taking pain medications.  Nausea: Take sips of ginger ale or soda  Fever -- Call physician if temperature over 101 degrees  Follow up:  If you do not already have a follow up appointment scheduled, please call the office at [phone].  If you experience fever (a temperature greater than 100.4), pain unrelieved by pain medication, shortness of breath, swelling of a single leg, or any other symptoms which are concerning to you please the office immediately.

## 2022-01-17 NOTE — Progress Notes (Signed)
Pt arrived to room # 302 via bed from PACU. Pt awake but drowsy, c/o severe lower abd pain, then dozes off to sleep. Pt denies any c/o nausea/vomiting, sipping on clear soda. Oriented to room and safety procedures. States understanding.  Pt asked to use restroom, pt able to get OOB without assistance. Pt ambulated to restroom with only standby assist. Pt voided clear yellow urine into toilet. Ambulated back to bed again with only standby assist. Husband now at bedside. Also advised on room and safety procedures, states understanding.

## 2022-01-17 NOTE — Transfer of Care (Signed)
Immediate Anesthesia Transfer of Care Note  Patient: Carol Sharp  Procedure(s) Performed: HYSTERECTOMY ABDOMINAL; TOTAL WITH BILATERAL SALPINGECTOMY  Patient Location: PACU  Anesthesia Type:General  Level of Consciousness: awake  Airway & Oxygen Therapy: Patient Spontanous Breathing and Patient connected to face mask oxygen  Post-op Assessment: Report given to RN and Post -op Vital signs reviewed and stable  Post vital signs: Reviewed and stable  Last Vitals:  Vitals Value Taken Time  BP    Temp    Pulse 80 01/17/22 1009  Resp 17 01/17/22 1009  SpO2 89 % 01/17/22 1009  Vitals shown include unvalidated device data.  Last Pain:  Vitals:   01/17/22 0630  PainSc: 0-No pain         Complications:  Encounter Notable Events  Notable Event Outcome Phase Comment  Difficult to intubate - expected  Intraprocedure Filed from anesthesia note documentation.

## 2022-01-18 LAB — CBC
HCT: 33.9 % — ABNORMAL LOW (ref 36.0–46.0)
Hemoglobin: 11.9 g/dL — ABNORMAL LOW (ref 12.0–15.0)
MCH: 32.6 pg (ref 26.0–34.0)
MCHC: 35.1 g/dL (ref 30.0–36.0)
MCV: 92.9 fL (ref 80.0–100.0)
Platelets: 328 10*3/uL (ref 150–400)
RBC: 3.65 MIL/uL — ABNORMAL LOW (ref 3.87–5.11)
RDW: 13 % (ref 11.5–15.5)
WBC: 11 10*3/uL — ABNORMAL HIGH (ref 4.0–10.5)
nRBC: 0 % (ref 0.0–0.2)

## 2022-01-18 LAB — BASIC METABOLIC PANEL
Anion gap: 9 (ref 5–15)
BUN: 15 mg/dL (ref 6–20)
CO2: 26 mmol/L (ref 22–32)
Calcium: 8.3 mg/dL — ABNORMAL LOW (ref 8.9–10.3)
Chloride: 100 mmol/L (ref 98–111)
Creatinine, Ser: 0.86 mg/dL (ref 0.44–1.00)
GFR, Estimated: >60 mL/min (ref >60)
Glucose, Bld: 105 mg/dL — ABNORMAL HIGH (ref 70–99)
Potassium: 2.8 mmol/L — ABNORMAL LOW (ref 3.5–5.1)
Sodium: 135 mmol/L (ref 135–145)

## 2022-01-18 LAB — SURGICAL PATHOLOGY

## 2022-01-18 MED ORDER — POTASSIUM CHLORIDE CRYS ER 20 MEQ PO TBCR
40.0000 meq | EXTENDED_RELEASE_TABLET | Freq: Once | ORAL | Status: AC
  Administered 2022-01-18: 40 meq via ORAL
  Filled 2022-01-18: qty 2

## 2022-01-18 MED ORDER — PROMETHAZINE HCL 12.5 MG PO TABS: 12.5000 mg | ORAL_TABLET | Freq: Four times a day (QID) | ORAL | 0 refills | Status: DC | PRN

## 2022-01-18 NOTE — Progress Notes (Signed)
  Transition of Care [facility]) Screening Note   Patient Details  Name: Carol Sharp Date of Birth: 04/26/1975   Transition of Care [facility]) CM/SW Contact:    Iona Beard, [place] Phone Number: 01/18/2022, 9:38 AM    Transition of Care Department [facility]) has reviewed patient and no TOC needs have been identified at this time. We will continue to monitor patient advancement through interdisciplinary progression rounds. If new patient transition needs arise, please place a TOC consult.

## 2022-01-18 NOTE — Discharge Summary (Signed)
Physician Discharge Summary  Patient ID: Carol Sharp MRN: [id number] DOB/AGE: 03/18/1975 46 y.o.  Admit date: 01/17/2022 Discharge date: 01/18/2022  Admission Diagnoses: 1) pelvic pain 2) Uterine fibroid   Discharge Diagnosis: same   Discharged Condition: stable  Hospital Course: 46yo female admitted for scheduled TAH, BS.  See operative note regarding procedure.  After surgery, she noted considerable nausea/vomiting that was treated with IV anti-emetics and fluids.  She is now tolerating clears and has had no further vomiting on POD#1.  She is meeting other milestones appropriately and will plan for discharge home on POD#1.  Consults: None  Significant Diagnostic Studies: labs:  Results for orders placed or performed during the hospital encounter of 01/17/22 (from the past 24 hour(s))  CBC     Status: Abnormal   Collection Time: 01/18/22  3:09 AM  Result Value Ref Range   WBC 11.0 (H) 4.0 - 10.5 K/uL   RBC 3.65 (L) 3.87 - 5.11 MIL/uL   Hemoglobin 11.9 (L) 12.0 - 15.0 g/dL   HCT 33.9 (L) 36.0 - 46.0 %   MCV 92.9 80.0 - 100.0 fL   MCH 32.6 26.0 - 34.0 pg   MCHC 35.1 30.0 - 36.0 g/dL   RDW 13.0 11.5 - 15.5 %   Platelets 328 150 - 400 K/uL   nRBC 0.0 0.0 - 0.2 %  Basic metabolic panel     Status: Abnormal   Collection Time: 01/18/22  3:09 AM  Result Value Ref Range   Sodium 135 135 - 145 mmol/L   Potassium 2.8 (L) 3.5 - 5.1 mmol/L   Chloride 100 98 - 111 mmol/L   CO2 26 22 - 32 mmol/L   Glucose, Bld 105 (H) 70 - 99 mg/dL   BUN 15 6 - 20 mg/dL   Creatinine, Ser 0.86 0.44 - 1.00 mg/dL   Calcium 8.3 (L) 8.9 - 10.3 mg/dL   GFR, Estimated >60 >60 mL/min   Anion gap 9 5 - 15     Treatments: IV hydration, antibiotics: Ancef, and surgery: Total abdominal hysterectomy and bilateral salpingectomy  Discharge Exam: Blood pressure 124/78, pulse 72, temperature 98.5 F (36.9 C), resp. rate 16, SpO2 95 %. General appearance: alert, cooperative, and no distress CV:  RRR Resp: clear to auscultation bilaterally Abd: obese, soft, +BS Incision: Clean/dry/intact with honeycomb Extremities: no edema, no calf tenderness bilaterally  Disposition: Discharge disposition: 01-Home or Self Care       Discharge Instructions     Discharge patient   Complete by: As directed    Discharge disposition: 01-Home or Self Care   Discharge patient date: 01/18/2022      Allergies as of 01/18/2022       Reactions   Lipitor [atorvastatin] Other (See Comments)   Joint pains severe   Amlodipine Swelling        Medication List     STOP taking these medications    ALPRAZolam 1 MG tablet Commonly known as: XANAX   HYDROcodone-acetaminophen 5-325 MG tablet Commonly known as: NORCO/VICODIN   meloxicam 15 MG tablet Commonly known as: MOBIC   Myfembree 40-1-0.5 MG Tabs Generic drug: Relugolix-Estradiol-Norethind   ondansetron 4 MG tablet Commonly known as: Zofran   predniSONE 20 MG tablet Commonly known as: DELTASONE       TAKE these medications    CENTRUM SILVER ADULT 50+ PO Take 1 tablet by mouth daily.   chlorthalidone 50 MG tablet Commonly known as: HYGROTON TAKE 1 TABLET BY MOUTH ONCE A DAY.  docusate sodium 100 MG capsule Commonly known as: Colace Take 1 capsule (100 mg total) by mouth 2 (two) times daily for 20 days.   gabapentin 300 MG capsule Commonly known as: Neurontin Take 1 capsule (300 mg total) by mouth 3 (three) times daily for 4 days.   ibuprofen 600 MG tablet Commonly known as: ADVIL Take 1 tablet (600 mg total) by mouth every 6 (six) hours as needed.   ketorolac 10 MG tablet Commonly known as: TORADOL Take 1 tablet (10 mg total) by mouth every 8 (eight) hours for 3 days.   ondansetron 4 MG disintegrating tablet Commonly known as: ZOFRAN-ODT Take 1 tablet (4 mg total) by mouth every 8 (eight) hours as needed for nausea or vomiting.   oxyCODONE 5 MG immediate release tablet Commonly known as: Oxy  IR/ROXICODONE Take 1 tablet (5 mg total) by mouth every 6 (six) hours as needed for up to 7 days for severe pain, moderate pain or breakthrough pain.   promethazine 12.5 MG tablet Commonly known as: PHENERGAN Take 1 tablet (12.5 mg total) by mouth every 6 (six) hours as needed for nausea or vomiting.   Repatha 140 MG/ML Sosy Generic drug: Evolocumab 140 mg subcutaneous q 2 weeks   sertraline 100 MG tablet Commonly known as: ZOLOFT Take 1 tablet (100 mg total) by mouth daily.   valACYclovir 1000 MG tablet Commonly known as: VALTREX 1 qd What changed:  how much to take how to take this when to take this reasons to take this additional instructions   valsartan 80 MG tablet Commonly known as: DIOVAN TAKE 1 TABLET BY MOUTH ONCE A DAY.        Follow-up Information     Janyth Pupa, DO. Go in 1 week(s).   Specialty: Obstetrics and Gynecology Why: Please follow up in one week as scheduled Contact information: [place] [address] [phone]                 Signed: Annalee Genta 01/18/2022, 8:34 AM

## 2022-01-18 NOTE — Progress Notes (Signed)
Alert and oriented.  Received oxycodone for pain and ate half an oatmeal with no extra pain or nausea.  Surgical honeycomb dry and intact and passing gas this morning.  IV removed and discharge instructions reviewed with patient and husband.  Taken by [facility] to main entrance and husband to drive home.  To follow up with Dr. Nelda Marseille in one week.

## 2022-01-19 ENCOUNTER — Encounter (HOSPITAL_COMMUNITY): Payer: Self-pay | Admitting: Obstetrics & Gynecology

## 2022-01-24 ENCOUNTER — Ambulatory Visit (INDEPENDENT_AMBULATORY_CARE_PROVIDER_SITE_OTHER): Payer: No Typology Code available for payment source | Admitting: Obstetrics & Gynecology

## 2022-01-24 ENCOUNTER — Encounter: Payer: Self-pay | Admitting: Obstetrics & Gynecology

## 2022-01-24 VITALS — BP 138/80 | HR 76 | Ht 69.5 in | Wt 282.6 lb

## 2022-01-24 DIAGNOSIS — Z4889 Encounter for other specified surgical aftercare: Secondary | ICD-10-CM

## 2022-01-24 DIAGNOSIS — G8918 Other acute postprocedural pain: Secondary | ICD-10-CM

## 2022-01-24 MED ORDER — OXYCODONE HCL 5 MG PO TABS: 5.0000 mg | ORAL_TABLET | Freq: Four times a day (QID) | ORAL | 0 refills | Status: AC | PRN

## 2022-01-24 NOTE — Progress Notes (Signed)
    PostOp Visit Note  Carol Sharp is a 46 y.o. [phone] female who presents for a postoperative visit. She is 1 week postop following a TAH,BS completed on 11/29   Today she notes that today is a bad day- she and her husband/daughter had a fight and she has been crying this am.  She ran out of oxycodone last night and is only taking ibuprofen.  She has not been taking tylenol. Denies fever or chills.  Tolerating gen diet.  +Flatus, Regular BMs.  Denies any vaginal bleeding. Overall doing ok.   Review of Systems Pertinent items are noted in HPI.    Objective:  BP 138/80 (BP Location: Right Arm, Patient Position: Sitting, Cuff Size: Normal)   Pulse 76   Ht 5' 9.5" (1.765 m)   Wt 282 lb 9.6 oz (128.2 kg)   LMP 02/14/2021   BMI 41.13 kg/m    Physical Examination:  GENERAL ASSESSMENT: well developed and well nourished SKIN: normal color, no lesions CHEST: normal air exchange, respiratory effort normal with no retractions HEART: regular rate and rhythm ABDOMEN: soft, non-distended, +BS, appropriately tender Ecchymosis noted INCISION: honeycomb removed- incision C/D/I EXTREMITY: no edema, no calf tenderness bilaterally PSYCH: tearful       Assessment:    Postop visit   Plan:   -reviewed regular use of tyelnol -refill oxy for breakthrough pain -f/u in 5wks for final postop visit -meeting milestones appropriately  Janyth Pupa, DO Attending [place], [place] for [employer], [place]

## 2022-02-08 ENCOUNTER — Other Ambulatory Visit: Payer: Self-pay | Admitting: Family Medicine

## 2022-02-26 ENCOUNTER — Telehealth: Payer: Self-pay | Admitting: Family Medicine

## 2022-02-26 DIAGNOSIS — Z79899 Other long term (current) drug therapy: Secondary | ICD-10-CM

## 2022-02-26 DIAGNOSIS — E785 Hyperlipidemia, unspecified: Secondary | ICD-10-CM

## 2022-02-26 NOTE — Telephone Encounter (Signed)
Nurses 1.  This medication was denied because insurance company states she has not had a recent cholesterol profile does show that the medicine is working 2.  Please find out from the patient if she still taking Repatha-or did she run out-if she did run out when did she run out #3 if she has been on the medicine all the way up until current time please have her do a lipid profile ASAP so we can submit this to the insurance company also she will need to do her 59-monthfollow-up she was seen at the end of July so she is due to follow-up end of January thank you  It would be wise if [place] recently need to do a lipid profile ASAP in order to hopefully show that the Repatha is helping so that way we can get it covered by her [employer]

## 2022-02-27 NOTE — Telephone Encounter (Signed)
Patient notified and stated she took her last injection last Thursday and will have blood work completed in the morning fasting. Blood work ordered in [employer].

## 2022-02-28 ENCOUNTER — Ambulatory Visit: Payer: No Typology Code available for payment source | Admitting: Obstetrics & Gynecology

## 2022-02-28 ENCOUNTER — Other Ambulatory Visit (HOSPITAL_COMMUNITY)
Admission: RE | Admit: 2022-02-28 | Discharge: 2022-02-28 | Disposition: A | Discharge status: Home / Self Care | Payer: No Typology Code available for payment source | Source: Ambulatory Visit | Attending: Obstetrics & Gynecology | Admitting: Obstetrics & Gynecology

## 2022-02-28 ENCOUNTER — Encounter: Payer: Self-pay | Admitting: Obstetrics & Gynecology

## 2022-02-28 VITALS — BP 152/100 | HR 81 | Ht 69.5 in | Wt 277.8 lb

## 2022-02-28 DIAGNOSIS — N898 Other specified noninflammatory disorders of vagina: Secondary | ICD-10-CM | POA: Diagnosis not present

## 2022-02-28 DIAGNOSIS — Z4889 Encounter for other specified surgical aftercare: Secondary | ICD-10-CM

## 2022-02-28 NOTE — Addendum Note (Signed)
Addended by: Janece Canterbury on: 02/28/2022 10:27 AM   Modules accepted: Orders

## 2022-02-28 NOTE — Progress Notes (Signed)
    PostOp Visit Note  Carol Sharp is a 47 y.o. [phone] female who presents for a postoperative visit. She is 6 weeks postop following a TAH, BS completed on 11/29   Today she notes clear discharge with slight odor for the past 1-2 weeks.  No itching.  No pain, no bleeding. Denies fever or chills.  Tolerating gen diet.  +Flatus, Regular BMs.  Overall doing well and reports no acute complaints   Review of Systems Pertinent items are noted in HPI.    Objective:  BP (!) 152/100 (BP Location: Left Arm, Patient Position: Sitting, Cuff Size: Normal)   Pulse 81   Ht 5' 9.5" (1.765 m)   Wt 277 lb 12.8 oz (126 kg)   LMP 02/14/2021   BMI 40.44 kg/m    Physical Examination:  GENERAL ASSESSMENT: well developed and well nourished SKIN: normal color, no lesions CHEST: normal air exchange, respiratory effort normal with no retractions HEART: regular rate and rhythm ABDOMEN: soft, non-distended, no rebound, no guarding, +BS INCISION: well healed- C/D/I GU: Vulva:  normal appearing vulva with no masses, tenderness or lesions Vagina:  normal mucosa, minimal white discharge, vaginal cuff intact both visually and on bimanual exam Cervix:  absent Uterus:  uterus absent Adnexa: ovaries:present,  normal adnexa in size, nontender and no masses  EXTREMITY: 1+ edema with varicosities, no calf tenderness bilaterally PSYCH: mood appropriate, normal affect       Assessment:    S/p TAH, BS, 6wk postop   Plan:   -meeting milestones appropriately -vaginal discharge- will treat pending results of vaginitis panel -may return to regular activity -no further pap smears -f/u prn  Janyth Pupa, DO Attending [place], [place] for [employer], [place]

## 2022-03-01 LAB — CERVICOVAGINAL ANCILLARY ONLY
Bacterial Vaginitis (gardnerella): NEGATIVE
Candida Glabrata: NEGATIVE
Candida Vaginitis: NEGATIVE
Comment: NEGATIVE
Comment: NEGATIVE
Comment: NEGATIVE

## 2022-03-01 LAB — HEPATIC FUNCTION PANEL
ALT: 18 IU/L (ref 0–32)
AST: 14 IU/L (ref 0–40)
Albumin: 4.7 g/dL (ref 3.9–4.9)
Alkaline Phosphatase: 66 IU/L (ref 44–121)
Bilirubin Total: 0.5 mg/dL (ref 0.0–1.2)
Bilirubin, Direct: 0.13 mg/dL (ref 0.00–0.40)
Total Protein: 7.3 g/dL (ref 6.0–8.5)

## 2022-03-01 LAB — LIPID PANEL
Chol/HDL Ratio: 3.9 ratio (ref 0.0–4.4)
Cholesterol, Total: 197 mg/dL (ref 100–199)
HDL: 51 mg/dL (ref >39)
LDL Chol Calc (NIH): 99 mg/dL (ref 0–99)
Triglycerides: 280 mg/dL — ABNORMAL HIGH (ref 0–149)
VLDL Cholesterol Cal: 47 mg/dL — ABNORMAL HIGH (ref 5–40)

## 2022-03-12 ENCOUNTER — Other Ambulatory Visit: Payer: Self-pay | Admitting: Family Medicine

## 2022-03-14 ENCOUNTER — Ambulatory Visit (INDEPENDENT_AMBULATORY_CARE_PROVIDER_SITE_OTHER): Payer: No Typology Code available for payment source | Admitting: Family Medicine

## 2022-03-14 ENCOUNTER — Encounter: Payer: Self-pay | Admitting: Family Medicine

## 2022-03-14 VITALS — BP 112/76 | HR 77 | Temp 97.7°F | Ht 69.5 in | Wt 277.0 lb

## 2022-03-14 DIAGNOSIS — F411 Generalized anxiety disorder: Secondary | ICD-10-CM | POA: Diagnosis not present

## 2022-03-14 DIAGNOSIS — I10 Essential (primary) hypertension: Secondary | ICD-10-CM | POA: Diagnosis not present

## 2022-03-14 DIAGNOSIS — Z6841 Body Mass Index (BMI) 40.0 and over, adult: Secondary | ICD-10-CM

## 2022-03-14 DIAGNOSIS — Z79899 Other long term (current) drug therapy: Secondary | ICD-10-CM

## 2022-03-14 DIAGNOSIS — E785 Hyperlipidemia, unspecified: Secondary | ICD-10-CM

## 2022-03-14 DIAGNOSIS — E876 Hypokalemia: Secondary | ICD-10-CM | POA: Diagnosis not present

## 2022-03-14 DIAGNOSIS — M5431 Sciatica, right side: Secondary | ICD-10-CM

## 2022-03-14 MED ORDER — CHLORTHALIDONE 50 MG PO TABS: ORAL_TABLET | ORAL | 1 refills | Status: DC

## 2022-03-14 MED ORDER — ALPRAZOLAM 1 MG PO TABS: 1.0000 mg | ORAL_TABLET | Freq: Every evening | ORAL | 2 refills | Status: DC | PRN

## 2022-03-14 MED ORDER — SERTRALINE HCL 100 MG PO TABS: ORAL_TABLET | ORAL | 1 refills | Status: DC

## 2022-03-14 MED ORDER — VALSARTAN 80 MG PO TABS: 80.0000 mg | ORAL_TABLET | Freq: Every day | ORAL | 1 refills | Status: DC

## 2022-03-14 MED ORDER — GABAPENTIN 300 MG PO CAPS: ORAL_CAPSULE | ORAL | 5 refills | Status: DC

## 2022-03-14 MED ORDER — MELOXICAM 15 MG PO TABS: 15.0000 mg | ORAL_TABLET | Freq: Every day | ORAL | 1 refills | Status: DC

## 2022-03-14 NOTE — Progress Notes (Signed)
   Subjective:    Patient ID: Carol Sharp, female    DOB: 04/20/1975, 47 y.o.   MRN: [id number]  HPI  6 month follow up refills requested pended for signature Right leg pain and back pain ongoing - possible referral  Mammogram order Benign essential hypertension - Plan: Basic Metabolic Panel, Magnesium  Hypokalemia - Plan: Basic Metabolic Panel  Hyperlipidemia, unspecified hyperlipidemia type  GAD (generalized anxiety disorder)  Sciatica, right side  Body mass index 40.0-44.9, adult ([place]), Chronic Under a lot of stress currently She is a caretaker Also having to do a fair amount of activity helping out others She is also having significant pain and discomfort with sciatica in her right leg burning pain aching pain no weakness. She also uses Repatha on a regular basis to help keep her cholesterol under control recent cholesterol looks good indicating how well this is working for her While she was in the hospital her potassium was severely low this needs to be rechecked Review of Systems     Objective:   Physical Exam General-in no acute distress Eyes-no discharge Lungs-respiratory rate normal, CTA CV-no murmurs,RRR Extremities skin warm dry no edema Neuro grossly normal Behavior normal, alert Subjective sciatica right leg       Assessment & Plan:   1. Benign essential hypertension I am very concerned about her potassium She will check her lab work today In addition to this we will reduce her chlorthalidone from 50 mg to half of that May need adjustments even further or possibly potassium - Basic Metabolic Panel - Magnesium  2. Hypokalemia Check kidney function await potassium await magnesium - Basic Metabolic Panel  3. Hyperlipidemia, unspecified hyperlipidemia type Continue Repatha it is doing a good job cholesterol looks good  4. GAD (generalized anxiety disorder) She is overwhelmed with all the responsibilities we did discuss counseling does not have  time currently for this but willing to go up on the dose of sertraline 1-1/2 daily  5. Sciatica, right side Previously she uses gabapentin for surgical procedure she stated she tolerated it well she will reinitiate gabapentin 300 mg 3 times daily and gradually titrate up on this over the course of the next 10 days if her sciatica is not doing dramatically better within 8 to 10 weeks MRI and specialist consultation follow-up sooner problems  6. Body mass index 40.0-44.9, adult [facility]) Portion control regular physical activity  Patient encouraged to get away from smoking but currently difficult for her to do so

## 2022-03-14 NOTE — Patient Instructions (Addendum)
Hi Elvis  Sorry you are under so much stress  Sertraline-I would recommend going up to 1-1/2 tablet daily  Gabapentin should help you with your sciatica I would start off gabapentin 300 mg 1 each evening for the first 3 days then 1 twice daily for the next 4 days then 1 taken 3 times daily If it does cause drowsiness please reduce the dose and let us know you are having troubles  Also please do your blood work we will let you know the results of the potassium  Please follow-up by summertime follow-up sooner if you feel things are going worse or not getting better  As for the chlorthalidone reduce the dose to a half a tablet daily  Take care-Dr. Wolfgang Phoenix

## 2022-03-17 LAB — BASIC METABOLIC PANEL
BUN/Creatinine Ratio: 21 (ref 9–23)
BUN: 26 mg/dL — ABNORMAL HIGH (ref 6–24)
CO2: 23 mmol/L (ref 20–29)
Calcium: 9.7 mg/dL (ref 8.7–10.2)
Chloride: 100 mmol/L (ref 96–106)
Creatinine, Ser: 1.21 mg/dL — ABNORMAL HIGH (ref 0.57–1.00)
Glucose: 97 mg/dL (ref 70–99)
Potassium: 4.1 mmol/L (ref 3.5–5.2)
Sodium: 140 mmol/L (ref 134–144)
eGFR: 56 mL/min/{1.73_m2} — ABNORMAL LOW (ref >59)

## 2022-03-17 LAB — MAGNESIUM: Magnesium: 2 mg/dL (ref 1.6–2.3)

## 2022-03-19 NOTE — Addendum Note (Signed)
Addended by: Dairl Ponder on: 03/19/2022 10:00 AM   Modules accepted: Orders

## 2022-03-31 ENCOUNTER — Other Ambulatory Visit: Payer: Self-pay | Admitting: Family Medicine

## 2022-04-19 ENCOUNTER — Encounter: Payer: Self-pay | Admitting: Radiology

## 2022-05-19 ENCOUNTER — Other Ambulatory Visit: Payer: Self-pay | Admitting: Family Medicine

## 2022-06-04 ENCOUNTER — Telehealth: Payer: Self-pay | Admitting: Family Medicine

## 2022-06-04 DIAGNOSIS — E785 Hyperlipidemia, unspecified: Secondary | ICD-10-CM

## 2022-06-04 DIAGNOSIS — R7989 Other specified abnormal findings of blood chemistry: Secondary | ICD-10-CM

## 2022-06-04 DIAGNOSIS — D509 Iron deficiency anemia, unspecified: Secondary | ICD-10-CM

## 2022-06-04 NOTE — Telephone Encounter (Signed)
Patient has 5 month follow up on 6/21 and wanting to know if she needs labs.

## 2022-06-05 NOTE — Telephone Encounter (Signed)
Yes I reviewed previous labs hemoglobin slightly down and creatinine was up I recommend : CBC,TIBC,MET 7 , uACR, Lipid, liver Dx: hyperlip, elev creatinine, iron def anemia  Please do in middle of morning when WELL HYDRATED with water, please explain to pt we will also be doing urine protiein test with the blood draw Thanks!

## 2022-06-07 NOTE — Telephone Encounter (Signed)
Called and spoke with patient. Advised patient per Dr Lorin Picket Please do in middle of morning when WELL HYDRATED with water, please explain to pt we will also be doing urine protiein test with the blood draw Thanks!        Patient verbalized understanding.

## 2022-06-07 NOTE — Telephone Encounter (Signed)
Labs ordered in Epic and Patient notified.

## 2022-06-08 ENCOUNTER — Encounter: Payer: Self-pay | Admitting: Orthopedic Surgery

## 2022-06-08 ENCOUNTER — Ambulatory Visit (INDEPENDENT_AMBULATORY_CARE_PROVIDER_SITE_OTHER): Payer: No Typology Code available for payment source | Admitting: Orthopedic Surgery

## 2022-06-08 DIAGNOSIS — M1711 Unilateral primary osteoarthritis, right knee: Secondary | ICD-10-CM

## 2022-06-08 DIAGNOSIS — M25561 Pain in right knee: Secondary | ICD-10-CM | POA: Diagnosis not present

## 2022-06-08 DIAGNOSIS — G8929 Other chronic pain: Secondary | ICD-10-CM

## 2022-06-08 MED ORDER — METHYLPREDNISOLONE ACETATE 40 MG/ML IJ SUSP
40.0000 mg | Freq: Once | INTRAMUSCULAR | Status: AC
  Administered 2022-06-08: 40 mg via INTRA_ARTICULAR

## 2022-06-08 NOTE — Progress Notes (Unsigned)
Chief Complaint  Patient presents with   Knee Pain    RT knee   Injections    RT knee   Encounter Diagnoses  Name Primary?   Chronic pain of right knee Yes   Primary osteoarthritis of right knee     Procedure note right knee injection   verbal consent was obtained to inject right knee joint  Timeout was completed to confirm the site of injection  The medications used were depomedrol 40 mg and 1% lidocaine 3 cc Anesthesia was provided by ethyl chloride and the skin was prepped with alcohol.  After cleaning the skin with alcohol a 20-gauge needle was used to inject the right knee joint. There were no complications. A sterile bandage was applied.    Fu as needed

## 2022-06-11 ENCOUNTER — Telehealth: Payer: Self-pay | Admitting: Orthopedic Surgery

## 2022-06-11 NOTE — Telephone Encounter (Signed)
Patient Carol Sharp stating she needs to get the MRI Dr. Romeo Apple ordered on a CD so she can pick it up. [phone]

## 2022-06-27 ENCOUNTER — Other Ambulatory Visit (HOSPITAL_COMMUNITY): Payer: Self-pay | Admitting: *Deleted

## 2022-06-27 DIAGNOSIS — M4316 Spondylolisthesis, lumbar region: Secondary | ICD-10-CM

## 2022-06-27 DIAGNOSIS — M5416 Radiculopathy, lumbar region: Secondary | ICD-10-CM

## 2022-06-29 ENCOUNTER — Other Ambulatory Visit: Payer: Self-pay | Admitting: Family Medicine

## 2022-07-03 LAB — CBC WITH DIFFERENTIAL/PLATELET
Basophils Absolute: 0.1 10*3/uL (ref 0.0–0.2)
Basos: 1 %
EOS (ABSOLUTE): 0.1 10*3/uL (ref 0.0–0.4)
Eos: 1 %
Hematocrit: 41.7 % (ref 34.0–46.6)
Hemoglobin: 14.2 g/dL (ref 11.1–15.9)
Immature Grans (Abs): 0 10*3/uL (ref 0.0–0.1)
Immature Granulocytes: 0 %
Lymphocytes Absolute: 3.5 10*3/uL — ABNORMAL HIGH (ref 0.7–3.1)
Lymphs: 31 %
MCH: 31.4 pg (ref 26.6–33.0)
MCHC: 34.1 g/dL (ref 31.5–35.7)
MCV: 92 fL (ref 79–97)
Monocytes Absolute: 0.5 10*3/uL (ref 0.1–0.9)
Monocytes: 4 %
Neutrophils Absolute: 7.2 10*3/uL — ABNORMAL HIGH (ref 1.4–7.0)
Neutrophils: 63 %
Platelets: 372 10*3/uL (ref 150–450)
RBC: 4.52 x10E6/uL (ref 3.77–5.28)
RDW: 12.9 % (ref 11.7–15.4)
WBC: 11.4 10*3/uL — ABNORMAL HIGH (ref 3.4–10.8)

## 2022-07-03 LAB — HEPATIC FUNCTION PANEL
ALT: 19 IU/L (ref 0–32)
AST: 16 IU/L (ref 0–40)
Albumin: 4.6 g/dL (ref 3.9–4.9)
Alkaline Phosphatase: 63 IU/L (ref 44–121)
Bilirubin Total: <0.2 mg/dL (ref 0.0–1.2)
Bilirubin, Direct: <0.1 mg/dL (ref 0.00–0.40)
Total Protein: 6.9 g/dL (ref 6.0–8.5)

## 2022-07-03 LAB — BASIC METABOLIC PANEL (7)
BUN/Creatinine Ratio: 17 (ref 9–23)
BUN: 18 mg/dL (ref 6–24)
CO2: 23 mmol/L (ref 20–29)
Chloride: 96 mmol/L (ref 96–106)
Creatinine, Ser: 1.06 mg/dL — ABNORMAL HIGH (ref 0.57–1.00)
Glucose: 84 mg/dL (ref 70–99)
Potassium: 4.1 mmol/L (ref 3.5–5.2)
Sodium: 135 mmol/L (ref 134–144)
eGFR: 65 mL/min/{1.73_m2} (ref >59)

## 2022-07-03 LAB — LIPID PANEL
Chol/HDL Ratio: 2.9 ratio (ref 0.0–4.4)
Cholesterol, Total: 194 mg/dL (ref 100–199)
HDL: 66 mg/dL (ref >39)
LDL Chol Calc (NIH): 85 mg/dL (ref 0–99)
Triglycerides: 266 mg/dL — ABNORMAL HIGH (ref 0–149)
VLDL Cholesterol Cal: 43 mg/dL — ABNORMAL HIGH (ref 5–40)

## 2022-07-03 LAB — MICROALBUMIN / CREATININE URINE RATIO
Creatinine, Urine: 89.5 mg/dL
Microalb/Creat Ratio: <3 mg/g creat (ref 0–29)
Microalbumin, Urine: <3 ug/mL

## 2022-07-03 LAB — IRON,TIBC AND FERRITIN PANEL
Ferritin: 64 ng/mL (ref 15–150)
Iron Saturation: 17 % (ref 15–55)
Iron: 54 ug/dL (ref 27–159)
Total Iron Binding Capacity: 324 ug/dL (ref 250–450)
UIBC: 270 ug/dL (ref 131–425)

## 2022-07-06 ENCOUNTER — Encounter: Payer: Self-pay | Admitting: Family Medicine

## 2022-07-06 NOTE — Progress Notes (Signed)
Please mail to patient

## 2022-07-23 ENCOUNTER — Ambulatory Visit (HOSPITAL_COMMUNITY)
Admission: RE | Admit: 2022-07-23 | Discharge: 2022-07-23 | Disposition: A | Discharge status: Home / Self Care | Payer: No Typology Code available for payment source | Source: Ambulatory Visit | Attending: *Deleted | Admitting: *Deleted

## 2022-07-23 ENCOUNTER — Ambulatory Visit (INDEPENDENT_AMBULATORY_CARE_PROVIDER_SITE_OTHER): Payer: No Typology Code available for payment source | Admitting: Family Medicine

## 2022-07-23 VITALS — BP 124/86 | HR 77 | Ht 69.5 in | Wt 265.8 lb

## 2022-07-23 DIAGNOSIS — M4316 Spondylolisthesis, lumbar region: Secondary | ICD-10-CM | POA: Insufficient documentation

## 2022-07-23 DIAGNOSIS — M5431 Sciatica, right side: Secondary | ICD-10-CM | POA: Diagnosis not present

## 2022-07-23 DIAGNOSIS — M5416 Radiculopathy, lumbar region: Secondary | ICD-10-CM

## 2022-07-23 MED ORDER — HYDROCODONE-ACETAMINOPHEN 10-325 MG PO TABS: ORAL_TABLET | ORAL | 0 refills | Status: DC

## 2022-07-23 MED ORDER — SERTRALINE HCL 100 MG PO TABS: ORAL_TABLET | ORAL | 1 refills | Status: DC

## 2022-07-23 NOTE — Progress Notes (Signed)
   Subjective:    Patient ID: Carol Sharp, female    DOB: 18-Jun-1975, 47 y.o.   MRN: [id number]  HPI Patient arrives today for 5 month follow up for medication check. Patient states she would like to talk about her MRI at noon today.  Patient with significant pain discomfort in her back radiates down the leg. Currently taking gabapentin 100 mg tablet from her spine specialist she is taking 3 of those taking 3 times daily for a total of 900 mg she has been on this for just a few days states it seems to help some hydrocodone that the specialist gave her did not help her at all  Patient does use Xanax at nighttime to help her sleep I told her not to take the Xanax with the pain medicine if she has to take pain pill at night and leave off Xanax try to have at least 4 hours between the medicines  She has an MRI later today hopefully this will give input regarding what is going on  She does have morbid obesity she is losing weight but she is not certain why she is up-to-date on colonoscopy no other alarm symptoms and symptoms   Review of Systems     Objective:   Physical Exam General-in no acute distress Eyes-no discharge Lungs-respiratory rate normal, CTA CV-no murmurs,RRR Extremities skin warm dry no edema Neuro grossly normal Behavior normal, alert Sciatica on the right side increased pain with movement       Assessment & Plan:  Sciatica, right side  Morbid obesity (HCC)  Hydrocodone 10 mg for pain 1 every 4 hours as needed pain caution drowsiness do not use frequently give [place] feedback how that is going  Continue the gabapentin as stated above essentially 300 mg 3 times daily may need to increase the dose  Weight is coming down we will monitor patient do a follow-up 3 to 4 months  MRI is pending await the results of this she is seeing a back specialist she will give [place] an update later this week

## 2022-07-27 ENCOUNTER — Other Ambulatory Visit (HOSPITAL_COMMUNITY): Payer: Self-pay | Admitting: *Deleted

## 2022-07-27 DIAGNOSIS — M5104 Intervertebral disc disorders with myelopathy, thoracic region: Secondary | ICD-10-CM

## 2022-08-03 ENCOUNTER — Ambulatory Visit (HOSPITAL_BASED_OUTPATIENT_CLINIC_OR_DEPARTMENT_OTHER): Payer: No Typology Code available for payment source

## 2022-08-10 ENCOUNTER — Other Ambulatory Visit: Payer: Self-pay | Admitting: Family Medicine

## 2022-08-10 ENCOUNTER — Ambulatory Visit: Payer: No Typology Code available for payment source | Admitting: Family Medicine

## 2022-08-13 ENCOUNTER — Ambulatory Visit: Payer: No Typology Code available for payment source | Admitting: Family Medicine

## 2022-08-21 ENCOUNTER — Ambulatory Visit (HOSPITAL_COMMUNITY)
Admission: RE | Admit: 2022-08-21 | Discharge: 2022-08-21 | Disposition: A | Discharge status: Home / Self Care | Payer: No Typology Code available for payment source | Source: Ambulatory Visit | Attending: *Deleted | Admitting: *Deleted

## 2022-08-21 DIAGNOSIS — M5104 Intervertebral disc disorders with myelopathy, thoracic region: Secondary | ICD-10-CM | POA: Diagnosis present

## 2022-09-05 ENCOUNTER — Other Ambulatory Visit: Payer: Self-pay | Admitting: Family Medicine

## 2022-09-25 ENCOUNTER — Other Ambulatory Visit: Payer: Self-pay | Admitting: Family Medicine

## 2022-10-17 ENCOUNTER — Other Ambulatory Visit: Payer: Self-pay | Admitting: Family Medicine

## 2022-10-19 ENCOUNTER — Ambulatory Visit (INDEPENDENT_AMBULATORY_CARE_PROVIDER_SITE_OTHER): Payer: No Typology Code available for payment source | Admitting: Family Medicine

## 2022-10-19 ENCOUNTER — Encounter: Payer: Self-pay | Admitting: Family Medicine

## 2022-10-19 VITALS — BP 117/83 | HR 77 | Temp 98.2°F | Wt 266.0 lb

## 2022-10-19 DIAGNOSIS — M5431 Sciatica, right side: Secondary | ICD-10-CM

## 2022-10-19 MED ORDER — DICLOFENAC SODIUM 75 MG PO TBEC: 75.0000 mg | DELAYED_RELEASE_TABLET | Freq: Two times a day (BID) | ORAL | 5 refills | Status: DC

## 2022-10-19 MED ORDER — PREDNISONE 20 MG PO TABS: ORAL_TABLET | ORAL | 0 refills | Status: DC

## 2022-10-19 MED ORDER — DULOXETINE HCL 30 MG PO CPEP: 30.0000 mg | ORAL_CAPSULE | Freq: Every day | ORAL | 3 refills | Status: DC

## 2022-10-19 MED ORDER — HYDROCODONE-ACETAMINOPHEN 10-325 MG PO TABS: ORAL_TABLET | ORAL | 0 refills | Status: DC

## 2022-10-19 NOTE — Progress Notes (Signed)
   Subjective:    Patient ID: Carol Sharp, female    DOB: Apr 18, 1975, 47 y.o.   MRN: [id number]  HPI Patient states she is still currently experiencing back pain. Patient sates her pain is a 10/10. Patient is taking Gabapentin 1800mg . Patient had back injections on 10/10/2022 and felt better. Patient was swimming yesterday 10/18/22 and this morning felt a chronic lower right back pain.   Patient with severe mid back pain low back pain radiates to the right buttock down the right leg into the lower leg She is seen a spine specialist and interventional team in [place] and they have done numerous injections and she states she is not getting any better With her current regimen she states her pain is at a 10 out of 10 when her pain flares up She gets flareups at least once every week if not twice a week She is debilitated very difficult for her to do activity around the house or regular work She denies abusing medications   Review of Systems     Objective:   Physical Exam General-in no acute distress Eyes-no discharge Lungs-respiratory rate normal, CTA CV-no murmurs,RRR Extremities skin warm dry no edema Neuro grossly normal Behavior normal, alert Positive straight leg raise on the right negative on the left Patient has difficulty getting out of a chair and onto the exam table   I do not find evidence of weakness I do not feel she needs any emergent intervention but she certainly would benefit from seeing a specialist     Assessment & Plan:  1. Sciatica, right side This needs further evaluation She has had previous MRIs recently and she states she has the disc of these MRIs Therefore we will not be doing any MRIs currently We will stop her meloxicam I would recommend diclofenac 75 mg twice daily In addition to this also recommend prednisone taper over the next 6 days first then start the diclofenac If stomach pain or discomfort stop Hydrocodone 10 mg / 325 mg 1 every 4  hours as needed pain caution drowsiness If she ends up needing hydrocodone on a regular basis we will need to do a pain management consultation and also discuss her benzodiazepine She was cautioned not to take alprazolam with the hydrocodone In addition to this she may continue the gabapentin I recommend consultation with a neurosurgeon/orthopedic spine surgeon we will help set this up after further discussion we will set up with Dr. Shon Baton in [place]  She will give [place] feedback within 10 to 14 days how she is doing - Ambulatory referral to Orthopedic Surgery

## 2022-11-02 ENCOUNTER — Encounter: Payer: Self-pay | Admitting: Family Medicine

## 2022-11-02 ENCOUNTER — Other Ambulatory Visit: Payer: Self-pay | Admitting: Family Medicine

## 2022-11-02 MED ORDER — PREDNISONE 20 MG PO TABS: ORAL_TABLET | ORAL | 0 refills | Status: DC

## 2022-11-02 MED ORDER — HYDROCODONE-ACETAMINOPHEN 10-325 MG PO TABS: ORAL_TABLET | ORAL | 0 refills | Status: DC

## 2022-11-02 MED ORDER — FLUCONAZOLE 150 MG PO TABS: ORAL_TABLET | ORAL | 2 refills | Status: DC

## 2022-11-06 NOTE — Telephone Encounter (Signed)
First prednisone was sent in  As for the Cymbalta nurses-please increase to 60 mg, #30, 1 daily, 2 refills  She needs to send [place] a update within 2 to 3 weeks how she is doing plus also I recommend a office visit please go ahead and schedule for approximately 4 weeks as a follow-up  Finally nurses-verify with the patient that she is not suicidal.  If having suicidal thoughts or concerns I highly recommend behavioral health intervention right away either through [place], ER, behavioral health

## 2022-11-07 MED ORDER — DULOXETINE HCL 60 MG PO CPEP: 60.0000 mg | ORAL_CAPSULE | Freq: Every day | ORAL | 2 refills | Status: DC

## 2022-11-12 ENCOUNTER — Other Ambulatory Visit: Payer: Self-pay | Admitting: Family Medicine

## 2022-12-06 ENCOUNTER — Ambulatory Visit: Payer: No Typology Code available for payment source | Admitting: Nurse Practitioner

## 2022-12-06 VITALS — BP 138/86 | HR 93 | Temp 98.4°F | Ht 69.5 in | Wt 260.8 lb

## 2022-12-06 DIAGNOSIS — G479 Sleep disorder, unspecified: Secondary | ICD-10-CM | POA: Diagnosis not present

## 2022-12-06 DIAGNOSIS — F419 Anxiety disorder, unspecified: Secondary | ICD-10-CM

## 2022-12-06 DIAGNOSIS — F32A Depression, unspecified: Secondary | ICD-10-CM | POA: Diagnosis not present

## 2022-12-06 NOTE — Progress Notes (Signed)
Subjective:    Patient ID: Carol Sharp, female    DOB: 1975/08/16, 47 y.o.   MRN: [id number]  HPI Presents for complaints of emotional distress.  Has had frequent crying spells over the past week.  Was recently wrote up at work which is the first time this is ever happened after being employed there for over 20 years.  Feels like her life is out of control.  Was increased from duloxetine 30 mg to 60 mg on 10/9 with minimal improvement so far.  Very limited sleep.  Feels like she is "never in the moment".  Hard to focus, concentrate or complete task.  Emotional lability gets upset or agitated very easily.  Has increased her cigarette smoking.  Denies any alcohol marijuana or drug use.  Her Xanax was originally written for sleep but has been taking a half tab during the day when symptoms are extreme which has used up most of her monthly supply.  When she does take the 1 mg tab at bedtime only gets a maximum of about 4 hours of sleep.  Denies any suicidal or homicidal thoughts or ideation.  No self-harm behaviors.    12/06/2022    1:54 PM  Depression screen PHQ 2/9  Decreased Interest 3  Down, Depressed, Hopeless 3  PHQ - 2 Score 6  Altered sleeping 3  Tired, decreased energy 2  Change in appetite 3  Feeling bad or failure about yourself  3  Trouble concentrating 3  Moving slowly or fidgety/restless 3  Suicidal thoughts 0  PHQ-9 Score 23  Difficult doing work/chores Somewhat difficult      12/06/2022    1:54 PM 07/23/2022   10:53 AM 03/14/2022    4:48 PM 12/14/2021    2:11 PM  GAD 7 : Generalized Anxiety Score  Nervous, Anxious, on Edge 3 1 3 3   Control/stop worrying 3 1 3 2   Worry too much - different things 3 1 3 3   Trouble relaxing 3 2 3 3   Restless 3 2 3 3   Easily annoyed or irritable 3 2 3 3   Afraid - awful might happen 3 2 0 0  Total GAD 7 Score 21 11 18 17   Anxiety Difficulty Somewhat difficult  Very difficult Very difficult  MDQ is positive for all items except  #5.  Review of Systems  Constitutional:  Positive for fatigue.  HENT:  Negative for sore throat and trouble swallowing.   Respiratory:  Positive for chest tightness. Negative for cough and shortness of breath.        Chest tightness sometimes with extreme anxiety.  Cardiovascular:  Positive for palpitations. Negative for chest pain.  Psychiatric/Behavioral:  Positive for agitation, decreased concentration, dysphoric mood and sleep disturbance. Negative for self-injury and suicidal ideas. The patient is nervous/anxious.        Objective:   Physical Exam Constitutional:      General: She is not in acute distress. Neck:     Comments: Thyroid nontender to palpation, no mass or goiter noted. Cardiovascular:     Rate and Rhythm: Normal rate and regular rhythm.     Heart sounds: No murmur heard. Pulmonary:     Effort: Pulmonary effort is normal. No respiratory distress.     Breath sounds: Normal breath sounds.  Neurological:     Mental Status: She is alert.  Psychiatric:        Attention and Perception: Attention and perception normal.        Mood and Affect:  Affect is tearful.        Speech: Speech normal.        Behavior: Behavior normal. Behavior is cooperative.        Thought Content: Thought content normal.        Cognition and Memory: Cognition and memory normal.        Judgment: Judgment normal.     Comments: Upset and crying during most of the visit.   Making good eye contact.  Dressed appropriately for the weather.  Thoughts logical coherent and relevant.       Assessment & Plan:   Problem List Items Addressed This Visit       Other   Anxiety and depression - Primary   Relevant Medications   traZODone (DESYREL) 50 MG tablet   Other Relevant Orders   Ambulatory referral to Psychology   Sleep disturbance   Relevant Orders   Ambulatory referral to Psychology   Meds ordered this encounter  Medications   traZODone (DESYREL) 50 MG tablet    Sig: Take 0.5-1  tablets (25-50 mg total) by mouth at bedtime as needed.    Dispense:  30 tablet    Refill:  0    Order Specific Question:   Supervising Provider    Answer:   Lilyan Punt A [[id number]]   Patient has a strong family history of mental health disorders including suicide.  Based on her scores on the MDQ, suspect possible mood disorder.  Will refer to psychiatry for further evaluation and medication management.  Also referred for counseling.  Patient defers FMLA for her issues at this time because she is saving her time off for possible orthopedic surgery.  Contact the office if she changes her mind. Start trazodone low-dose as directed for sleep.  Discontinue medication if any problems.   If this will work for sleep, may take her Xanax half tab p.o. twice daily as needed extreme anxiety during the day. Seek help immediately if any suicidal thoughts or ideation.  Patient verbalizes agreement with this plan. Return in about 1 month (around 01/06/2023) for video is fine. Call back sooner if needed.

## 2022-12-07 ENCOUNTER — Encounter: Payer: Self-pay | Admitting: Nurse Practitioner

## 2022-12-07 ENCOUNTER — Other Ambulatory Visit: Payer: Self-pay | Admitting: Nurse Practitioner

## 2022-12-07 DIAGNOSIS — F419 Anxiety disorder, unspecified: Secondary | ICD-10-CM

## 2022-12-07 MED ORDER — TRAZODONE HCL 50 MG PO TABS: 25.0000 mg | ORAL_TABLET | Freq: Every evening | ORAL | 0 refills | Status: DC | PRN

## 2023-01-02 ENCOUNTER — Encounter: Payer: Self-pay | Admitting: Nurse Practitioner

## 2023-01-02 ENCOUNTER — Ambulatory Visit: Payer: No Typology Code available for payment source | Admitting: Nurse Practitioner

## 2023-01-02 VITALS — BP 120/80 | HR 52 | Temp 98.2°F | Ht 69.5 in | Wt 265.0 lb

## 2023-01-02 DIAGNOSIS — F419 Anxiety disorder, unspecified: Secondary | ICD-10-CM

## 2023-01-02 DIAGNOSIS — Z01818 Encounter for other preprocedural examination: Secondary | ICD-10-CM | POA: Diagnosis not present

## 2023-01-02 DIAGNOSIS — I10 Essential (primary) hypertension: Secondary | ICD-10-CM | POA: Diagnosis not present

## 2023-01-02 DIAGNOSIS — Z130 Encounter for screening for diseases of the blood and blood-forming organs and certain disorders involving the immune mechanism: Secondary | ICD-10-CM | POA: Diagnosis not present

## 2023-01-02 DIAGNOSIS — F32A Depression, unspecified: Secondary | ICD-10-CM

## 2023-01-02 MED ORDER — HYDROCODONE-ACETAMINOPHEN 10-325 MG PO TABS: ORAL_TABLET | ORAL | 0 refills | Status: DC

## 2023-01-02 MED ORDER — TRAZODONE HCL 50 MG PO TABS: 25.0000 mg | ORAL_TABLET | Freq: Every evening | ORAL | 0 refills | Status: DC | PRN

## 2023-01-02 NOTE — Progress Notes (Signed)
Subjective:    Patient ID: Carol Sharp, female    DOB: [birth year], 47 y.o.   MRN: [id number]  HPI Presents for preoperative clearance for right hip replacement surgery.  Sleep is doing much better with trazodone but still experiencing significant constant pain in her right hip.  Also currently on duloxetine and alprazolam for anxiety and depression.  Symptoms have slightly improved since last visit.  Denies any suicidal or homicidal thoughts or ideation. Patient requesting medication for pain management until her surgery.  Describes pain as 8/10, constant.  The last time she took hydrocodone 10 mg the pain went down to 3/10.  Describes the pain is unbearable at this point.  Used a variable amount from 1/2 to 1 pill at a time depending on the level of pain.  Adjusted the dose as needed.  Her usual interventions that she is currently doing such as ice and heat and ibuprofen are not working at this point. Continues to smoke half pack per day or less. Husband is present during the visit per her request.   Review of Systems  Constitutional:  Positive for fatigue. Negative for fever.  Respiratory:  Negative for cough, chest tightness, shortness of breath and wheezing.   Cardiovascular:  Negative for chest pain and leg swelling.  Gastrointestinal:  Negative for abdominal pain, constipation, diarrhea, nausea and vomiting.  Genitourinary:  Negative for dysuria, frequency, hematuria and urgency.      01/02/2023    9:35 AM  Depression screen PHQ 2/9  Decreased Interest 2  Down, Depressed, Hopeless 2  PHQ - 2 Score 4  Altered sleeping 0  Tired, decreased energy 2  Change in appetite 2  Feeling bad or failure about yourself  2  Trouble concentrating 3  Moving slowly or fidgety/restless 3  Suicidal thoughts 0  PHQ-9 Score 16  Difficult doing work/chores Very difficult      01/02/2023    9:35 AM 12/06/2022    1:54 PM 07/23/2022   10:53 AM 03/14/2022    4:48 PM  GAD 7 : Generalized  Anxiety Score  Nervous, Anxious, on Edge 3 3 1 3   Control/stop worrying 3 3 1 3   Worry too much - different things 3 3 1 3   Trouble relaxing 0 3 2 3   Restless 0 3 2 3   Easily annoyed or irritable 0 3 2 3   Afraid - awful might happen 0 3 2 0  Total GAD 7 Score 9 21 11 18   Anxiety Difficulty  Somewhat difficult  Very difficult         Objective:   Physical Exam Vitals and nursing note reviewed.  Constitutional:      General: She is not in acute distress.    Comments: Frequent moving and changing position due to pain in her right hip.  In obvious pain.  Cardiovascular:     Rate and Rhythm: Normal rate and regular rhythm.     Heart sounds: Normal heart sounds. No murmur heard. Pulmonary:     Effort: Pulmonary effort is normal.     Breath sounds: Normal breath sounds.  Abdominal:     General: There is no distension.     Palpations: There is no mass.     Tenderness: There is no abdominal tenderness.  Neurological:     Mental Status: She is alert.     Gait: Gait abnormal.  Psychiatric:        Mood and Affect: Mood normal.  Behavior: Behavior normal.        Thought Content: Thought content normal.        Judgment: Judgment normal.    Today's Vitals   01/02/23 0927  BP: 120/80  Pulse: (!) 52  Temp: 98.2 F (36.8 C)  SpO2: 98%  Weight: 265 lb (120.2 kg)  Height: 5' 9.5" (1.765 m)   Body mass index is 38.57 kg/m.         Assessment & Plan:   Problem List Items Addressed This Visit       Cardiovascular and Mediastinum   Benign essential hypertension   Relevant Orders   Basic metabolic panel     Other   Anxiety and depression   Other Visit Diagnoses     Pre-operative clearance    -  Primary   Relevant Orders   CBC with Differential/Platelet   Basic metabolic panel   Screening for deficiency anemia       Relevant Orders   CBC with Differential/Platelet        Discussed options with patient.  Explained that she cannot take the alprazolam  with an opiate due to risk.  Patient decided today that she needs the pain medication more at this point until her surgery.  Discontinue alprazolam.  Patient just had this filled. Meds ordered this encounter  Medications   HYDROcodone-acetaminophen (NORCO) 10-325 MG tablet    Sig: Take 1/2-1 tab po QID prn severe pain    Dispense:  60 tablet    Refill:  0    Instructed to DC Xanax    Order Specific Question:   Supervising Provider    Answer:   Lilyan Punt A [[id number]]   traZODone (DESYREL) 50 MG tablet    Sig: Take 0.5-1 tablets (25-50 mg total) by mouth at bedtime as needed.    Dispense:  90 tablet    Refill:  0    Order Specific Question:   Supervising Provider    Answer:   Babs Sciara [id number]   Start hydrocodone/acetaminophen 10/325 as directed as needed severe pain.  Drowsiness precautions.  Do not take with alprazolam. Continue ice/heat applications.  Patient has lidocaine patches at home, recommend she apply these to the hip area.  Stretching is much as possible.  Lab work ordered today as required by orthopedic specialist.  Once this has been posted, will complete her preop evaluation form.  No further testing indicated at this time. Patient understands the pain medication is a temporary measure until she gets her surgery, at that point orthopedic specialist will be handling any postop pain management. Will continue current regimen of Trazodone and Duloxetine.  Return in about 3 months (around 04/04/2023).

## 2023-01-03 LAB — CBC WITH DIFFERENTIAL/PLATELET
Basophils Absolute: 0.1 10*3/uL (ref 0.0–0.2)
Basos: 1 %
EOS (ABSOLUTE): 0.5 10*3/uL — ABNORMAL HIGH (ref 0.0–0.4)
Eos: 5 %
Hematocrit: 45.5 % (ref 34.0–46.6)
Hemoglobin: 15.7 g/dL (ref 11.1–15.9)
Immature Grans (Abs): 0 10*3/uL (ref 0.0–0.1)
Immature Granulocytes: 0 %
Lymphocytes Absolute: 3.3 10*3/uL — ABNORMAL HIGH (ref 0.7–3.1)
Lymphs: 35 %
MCH: 33.2 pg — ABNORMAL HIGH (ref 26.6–33.0)
MCHC: 34.5 g/dL (ref 31.5–35.7)
MCV: 96 fL (ref 79–97)
Monocytes Absolute: 0.5 10*3/uL (ref 0.1–0.9)
Monocytes: 6 %
Neutrophils Absolute: 4.9 10*3/uL (ref 1.4–7.0)
Neutrophils: 53 %
Platelets: 349 10*3/uL (ref 150–450)
RBC: 4.73 x10E6/uL (ref 3.77–5.28)
RDW: 12.4 % (ref 11.7–15.4)
WBC: 9.4 10*3/uL (ref 3.4–10.8)

## 2023-01-03 LAB — BASIC METABOLIC PANEL
BUN/Creatinine Ratio: 15 (ref 9–23)
BUN: 16 mg/dL (ref 6–24)
CO2: 23 mmol/L (ref 20–29)
Calcium: 10.2 mg/dL (ref 8.7–10.2)
Chloride: 99 mmol/L (ref 96–106)
Creatinine, Ser: 1.09 mg/dL — ABNORMAL HIGH (ref 0.57–1.00)
Glucose: 95 mg/dL (ref 70–99)
Potassium: 4.5 mmol/L (ref 3.5–5.2)
Sodium: 139 mmol/L (ref 134–144)
eGFR: 63 mL/min/{1.73_m2} (ref >59)

## 2023-01-09 ENCOUNTER — Telehealth: Payer: No Typology Code available for payment source | Admitting: Nurse Practitioner

## 2023-01-10 ENCOUNTER — Other Ambulatory Visit: Payer: Self-pay | Admitting: Family Medicine

## 2023-01-17 ENCOUNTER — Other Ambulatory Visit: Payer: Self-pay | Admitting: Family Medicine

## 2023-01-24 ENCOUNTER — Ambulatory Visit: Payer: Self-pay | Admitting: Student

## 2023-01-24 NOTE — Progress Notes (Signed)
Sent message, via epic in basket, requesting orders in epic from surgeon.  

## 2023-01-29 NOTE — Progress Notes (Signed)
Anesthesia Review:  PCP: DR Lilyan Punt  LOV 10/19/22.  Sherie Don, NP LOV 01/02/23 - preop  Cardiologist : none  Chest x-ray : EKG : 01/31/23  Echo : Stress test: Cardiac Cath :  Activity level: can do a flight of stairs without difficulty  Sleep Study/ CPAP : none  Fasting Blood Sugar :      / Checks Blood Sugar -- times a day:   Blood Thinner/ Instructions /Last Dose: ASA / Instructions/ Last Dose    Blood pressure at preop appt was 146/111 in right upper arm and in right lower arm was 148/98.  PT denies any chest pain, shortness of breath, dizziness, headache or blurred vision.  PT states" My blood pressure runs high.  Top number is 130-160 and bottom number is 90 and above.  PT given copy of blood pressure readings and pt able to monitor blood pressure and will be in touch with DR Gerda Diss in regards to blood pressure readings.

## 2023-01-29 NOTE — Patient Instructions (Addendum)
SURGICAL WAITING ROOM VISITATION  Patients having surgery or a procedure may have no more than 2 support people in the waiting area - these visitors may rotate.    Children under the age of 18 must have an adult with them who is not the patient.  Due to an increase in RSV and influenza rates and associated hospitalizations, children ages 68 and under may not visit patients in [facility] hospitals.  If the patient needs to stay at the hospital during part of their recovery, the visitor guidelines for inpatient rooms apply. Pre-op nurse will coordinate an appropriate time for 1 support person to accompany patient in pre-op.  This support person may not rotate.    Please refer to the [facility] website for the visitor guidelines for Inpatients (after your surgery is over and you are in a regular room).       Your procedure is scheduled on:  02/07/2023    Report to [facility] Main Entrance    Report to admitting at   [phone]   Call this number if you have problems the morning of surgery [phone]   Do not eat food :After Midnight.   After Midnight you may have the following liquids until __ 0430____ AM DAY OF SURGERY  Water Non-Citrus Juices (without pulp, NO RED-Apple, White grape, White cranberry) Black Coffee (NO MILK/CREAM OR CREAMERS, sugar ok)  Clear Tea (NO MILK/CREAM OR CREAMERS, sugar ok) regular and decaf                             Plain Jell-O (NO RED)                                           Fruit ices (not with fruit pulp, NO RED)                                     Popsicles (NO RED)                                                               Sports drinks like Gatorade (NO RED)                   The day of surgery:  Drink ONE (1) Pre-Surgery Clear Ensure or G2 at  0430AM the morning of surgery. Drink in one sitting. Do not sip.  This drink was given to you during your hospital  pre-op appointment visit. Nothing else to drink after completing the   Pre-Surgery Clear Ensure or G2.          If you have questions, please contact your surgeon's office.       Oral Hygiene is also important to reduce your risk of infection.                                    Remember - BRUSH YOUR TEETH THE MORNING OF SURGERY WITH YOUR REGULAR TOOTHPASTE  DENTURES WILL BE REMOVED  PRIOR TO SURGERY PLEASE DO NOT APPLY "Poly grip" OR ADHESIVES!!!   Do NOT smoke after Midnight   Stop all vitamins and herbal supplements 7 days before surgery.   Take these medicines the morning of surgery with A SIP OF WATER:   cymbalta, gabapentin   DO NOT TAKE ANY ORAL DIABETIC MEDICATIONS DAY OF YOUR SURGERY  Bring CPAP mask and tubing day of surgery.                              You may not have any metal on your body including hair pins, jewelry, and body piercing             Do not wear make-up, lotions, powders, perfumes/cologne, or deodorant  Do not wear nail polish including gel and S&S, artificial/acrylic nails, or any other type of covering on natural nails including finger and toenails. If you have artificial nails, gel coating, etc. that needs to be removed by a nail salon please have this removed prior to surgery or surgery may need to be canceled/ delayed if the surgeon/ anesthesia feels like they are unable to be safely monitored.   Do not shave  48 hours prior to surgery.               Men may shave face and neck.   Do not bring valuables to the hospital. [facility] IS NOT             RESPONSIBLE   FOR VALUABLES.   Contacts, glasses, dentures or bridgework may not be worn into surgery.   Bring small overnight bag day of surgery.   DO NOT BRING YOUR HOME MEDICATIONS TO THE HOSPITAL. PHARMACY WILL DISPENSE MEDICATIONS LISTED ON YOUR MEDICATION LIST TO YOU DURING YOUR ADMISSION IN THE HOSPITAL!    Patients discharged on the day of surgery will not be allowed to drive home.  Someone NEEDS to stay with you for the first 24 hours after  anesthesia.   Special Instructions: Bring a copy of your healthcare power of attorney and living will documents the day of surgery if you haven't scanned them before.              Please read over the following fact sheets you were given: IF YOU HAVE QUESTIONS ABOUT YOUR PRE-OP INSTRUCTIONS PLEASE CALL [phone]   If you received a COVID test during your pre-op visit  it is requested that you wear a mask when out in public, stay away from anyone that may not be feeling well and notify your surgeon if you develop symptoms. If you test positive for Covid or have been in contact with anyone that has tested positive in the last 10 days please notify you surgeon.      Pre-operative 5 CHG Bath Instructions   You can play a key role in reducing the risk of infection after surgery. Your skin needs to be as free of germs as possible. You can reduce the number of germs on your skin by washing with CHG (chlorhexidine gluconate) soap before surgery. CHG is an antiseptic soap that kills germs and continues to kill germs even after washing.   DO NOT use if you have an allergy to chlorhexidine/CHG or antibacterial soaps. If your skin becomes reddened or irritated, stop using the CHG and notify one of our RNs at [phone].   Please shower with the CHG soap starting 4 days before surgery using the  following schedule:     Please keep in mind the following:  DO NOT shave, including legs and underarms, starting the day of your first shower.   You may shave your face at any point before/day of surgery.  Place clean sheets on your bed the day you start using CHG soap. Use a clean washcloth (not used since being washed) for each shower. DO NOT sleep with pets once you start using the CHG.   CHG Shower Instructions:  If you choose to wash your hair and private area, wash first with your normal shampoo/soap.  After you use shampoo/soap, rinse your hair and body thoroughly to remove shampoo/soap residue.   Turn the water OFF and apply about 3 tablespoons (45 ml) of CHG soap to a CLEAN washcloth.  Apply CHG soap ONLY FROM YOUR NECK DOWN TO YOUR TOES (washing for 3-5 minutes)  DO NOT use CHG soap on face, private areas, open wounds, or sores.  Pay special attention to the area where your surgery is being performed.  If you are having back surgery, having someone wash your back for you may be helpful. Wait 2 minutes after CHG soap is applied, then you may rinse off the CHG soap.  Pat dry with a clean towel  Put on clean clothes/pajamas   If you choose to wear lotion, please use ONLY the CHG-compatible lotions on the back of this paper.     Additional instructions for the day of surgery: DO NOT APPLY any lotions, deodorants, cologne, or perfumes.   Put on clean/comfortable clothes.  Brush your teeth.  Ask your nurse before applying any prescription medications to the skin.      CHG Compatible Lotions   Aveeno Moisturizing lotion  Cetaphil Moisturizing Cream  Cetaphil Moisturizing Lotion  Clairol Herbal Essence Moisturizing Lotion, Dry Skin  Clairol Herbal Essence Moisturizing Lotion, Extra Dry Skin  Clairol Herbal Essence Moisturizing Lotion, Normal Skin  Curel Age Defying Therapeutic Moisturizing Lotion with Alpha Hydroxy  Curel Extreme Care Body Lotion  Curel Soothing Hands Moisturizing Hand Lotion  Curel Therapeutic Moisturizing Cream, Fragrance-Free  Curel Therapeutic Moisturizing Lotion, Fragrance-Free  Curel Therapeutic Moisturizing Lotion, Original Formula  Eucerin Daily Replenishing Lotion  Eucerin Dry Skin Therapy Plus Alpha Hydroxy Crme  Eucerin Dry Skin Therapy Plus Alpha Hydroxy Lotion  Eucerin Original Crme  Eucerin Original Lotion  Eucerin Plus Crme Eucerin Plus Lotion  Eucerin TriLipid Replenishing Lotion  Keri Anti-Bacterial Hand Lotion  Keri Deep Conditioning Original Lotion Dry Skin Formula Softly Scented  Keri Deep Conditioning Original Lotion, Fragrance  Free Sensitive Skin Formula  Keri Lotion Fast Absorbing Fragrance Free Sensitive Skin Formula  Keri Lotion Fast Absorbing Softly Scented Dry Skin Formula  Keri Original Lotion  Keri Skin Renewal Lotion Keri Silky Smooth Lotion  Keri Silky Smooth Sensitive Skin Lotion  Nivea Body Creamy Conditioning Oil  Nivea Body Extra Enriched Teacher, adult education Moisturizing Lotion Nivea Crme  Nivea Skin Firming Lotion  NutraDerm 30 Skin Lotion  NutraDerm Skin Lotion  NutraDerm Therapeutic Skin Cream  NutraDerm Therapeutic Skin Lotion  ProShield Protective Hand Cream  Provon moisturizing lotion

## 2023-01-31 ENCOUNTER — Ambulatory Visit: Payer: Self-pay | Admitting: Family Medicine

## 2023-01-31 ENCOUNTER — Encounter (HOSPITAL_COMMUNITY): Payer: Self-pay

## 2023-01-31 ENCOUNTER — Other Ambulatory Visit: Payer: Self-pay

## 2023-01-31 ENCOUNTER — Encounter (HOSPITAL_COMMUNITY)
Admission: RE | Admit: 2023-01-31 | Discharge: 2023-01-31 | Disposition: A | Discharge status: Home / Self Care | Payer: No Typology Code available for payment source | Source: Ambulatory Visit | Attending: Orthopedic Surgery | Admitting: Orthopedic Surgery

## 2023-01-31 VITALS — BP 148/98 | HR 71 | Temp 98.0°F | Resp 16 | Ht 69.5 in | Wt 261.0 lb

## 2023-01-31 DIAGNOSIS — Z01818 Encounter for other preprocedural examination: Secondary | ICD-10-CM | POA: Insufficient documentation

## 2023-01-31 LAB — BASIC METABOLIC PANEL
Anion gap: 7 (ref 5–15)
BUN: 24 mg/dL — ABNORMAL HIGH (ref 6–20)
CO2: 26 mmol/L (ref 22–32)
Calcium: 9.1 mg/dL (ref 8.9–10.3)
Chloride: 104 mmol/L (ref 98–111)
Creatinine, Ser: 1.01 mg/dL — ABNORMAL HIGH (ref 0.44–1.00)
GFR, Estimated: >60 mL/min (ref >60)
Glucose, Bld: 141 mg/dL — ABNORMAL HIGH (ref 70–99)
Potassium: 3.6 mmol/L (ref 3.5–5.1)
Sodium: 137 mmol/L (ref 135–145)

## 2023-01-31 LAB — CBC
HCT: 42.2 % (ref 36.0–46.0)
Hemoglobin: 14.6 g/dL (ref 12.0–15.0)
MCH: 33.4 pg (ref 26.0–34.0)
MCHC: 34.6 g/dL (ref 30.0–36.0)
MCV: 96.6 fL (ref 80.0–100.0)
Platelets: 304 10*3/uL (ref 150–400)
RBC: 4.37 MIL/uL (ref 3.87–5.11)
RDW: 12.7 % (ref 11.5–15.5)
WBC: 10.1 10*3/uL (ref 4.0–10.5)
nRBC: 0 % (ref 0.0–0.2)

## 2023-01-31 LAB — SURGICAL PCR SCREEN
MRSA, PCR: NEGATIVE
Staphylococcus aureus: NEGATIVE

## 2023-01-31 MED ORDER — CHLORTHALIDONE 50 MG PO TABS: ORAL_TABLET | ORAL | 0 refills | Status: DC

## 2023-01-31 NOTE — Telephone Encounter (Signed)
Copied from CRM [id number]. Topic: Clinical - Medical Advice >> Jan 31, 2023  1:51 PM Fuller Mandril wrote: Reason for CRM: Pt called in with concerns about elevated blood pressure. Would like someone to give her a call back. Last ready 148/109. Thank You  Chief Complaint:  Would like Dr. Fletcher Anon office to call her back about her elevated bp.   Symptoms: Patient would not give triage nurse any information other than her bp readings Frequency: n/a Pertinent Negatives: Patient would not answer questions Disposition: [] ED /[] Urgent Care (no appt availability in office) / [] Appointment(In office/virtual)/ []  [facility]/ [] Home Care/ [] Refused Recommended Disposition /[] [place] [facility]/ [x]  Follow-up with PCP Additional Notes: Patient called and wanted Dr.'s office to call her back regarding her bp.  Declined to answer any questions for triage nurse.  Message routed to PCP office.   Answer Assessment - Initial Assessment Questions 1. REASON FOR CALL or QUESTION: "What is your reason for calling today?" or "How can I best help you?" or "What question do you have that I can help answer?"     Would like Dr. Fletcher Anon office to call her back about her elevated bp.  Protocols used: Information Only Call - No Triage-A-AH

## 2023-01-31 NOTE — Telephone Encounter (Signed)
Called pt back to ask for additional medication based on her earlier telephone call for high blood pressure. Pt states she needs a refill on [place] sent to [facility]. Rx has been sent

## 2023-02-05 ENCOUNTER — Ambulatory Visit: Payer: Self-pay | Admitting: Student

## 2023-02-05 ENCOUNTER — Telehealth: Payer: Self-pay | Admitting: Family Medicine

## 2023-02-05 ENCOUNTER — Other Ambulatory Visit: Payer: Self-pay | Admitting: Nurse Practitioner

## 2023-02-05 NOTE — Telephone Encounter (Signed)
Refill on Alprazolam 1mg  send to [facility]

## 2023-02-05 NOTE — H&P (View-Only) (Signed)
TOTAL HIP ADMISSION H&P  Patient is admitted for right total hip arthroplasty.  Subjective:  Chief Complaint: right hip pain  HPI: Carol Sharp, 47 y.o. female, has a history of pain and functional disability in the right hip(s) due to arthritis and patient has failed non-surgical conservative treatments for greater than 12 weeks to include NSAID's and/or analgesics, flexibility and strengthening excercises, use of assistive devices, weight reduction as appropriate, and activity modification.  Onset of symptoms was gradual starting 10 years ago with rapidlly worsening course since that time.The patient noted no past surgery on the right hip(s).  Patient currently rates pain in the right hip at 10 out of 10 with activity. Patient has night pain, worsening of pain with activity and weight bearing, trendelenberg gait, pain that interfers with activities of daily living, and pain with passive range of motion. Patient has evidence of subchondral cysts, subchondral sclerosis, periarticular osteophytes, and joint space narrowing by imaging studies. This condition presents safety issues increasing the risk of falls.  There is no current active infection.  Patient Active Problem List   Diagnosis Date Noted   Pelvic pain 10/03/2021   Fibroids 10/03/2021   Body mass index 40.0-44.9, adult (HCC) 01/03/2021   Bilateral carpal tunnel syndrome 12/28/2020   Sleep disturbance 11/09/2020   S/P endometrial ablation 11/09/2020   Anxiety and depression 11/09/2020   Elevated transaminase level 05/20/2019   Hyperlipidemia 05/20/2019   GAD (generalized anxiety disorder) 05/20/2019   Insomnia 02/04/2018   S/P arthroscopy of right knee 04/18/17    Primary osteoarthritis of right knee    Morbid obesity (HCC) 07/15/2013   Benign essential hypertension 10/06/2012   Adjustment disorder with mixed anxiety and depressed mood 09/08/2012   Past Medical History:  Diagnosis Date   Anxiety    Arthritis    Breast  nodule 08/02/2015   Depression    Hypertension    MRSA cellulitis 06/2003   PONV (postoperative nausea and vomiting)     Past Surgical History:  Procedure Laterality Date   ABDOMINAL HYSTERECTOMY N/A 01/17/2022   Procedure: HYSTERECTOMY ABDOMINAL; TOTAL WITH BILATERAL SALPINGECTOMY;  Surgeon: Myna Hidalgo, DO;  Location: [facility];  Service: Gynecology;  Laterality: N/A;   ABLATION  06/19/2008   CESAREAN SECTION     CHOLECYSTECTOMY     COLONOSCOPY WITH PROPOFOL N/A 02/16/2021   Procedure: COLONOSCOPY WITH PROPOFOL;  Surgeon: Lanelle Bal, DO;  Location: [facility];  Service: Endoscopy;  Laterality: N/A;  1:00pm   DILATION AND CURETTAGE OF UTERUS  11/20/1998   INCISIONAL HERNIA REPAIR N/A 03/24/2021   Procedure: LAPAROSCOPIC INCISIONAL HERNIA W/MESH;  Surgeon: Franky Macho, MD;  Location: [facility];  Service: General;  Laterality: N/A;   KNEE ARTHROSCOPY WITH MEDIAL MENISECTOMY Right 04/18/2017   Procedure: RIGHT KNEE ARTHROSCOPY WITH PARTIAL  MEDIAL MENISECTOMY;  Surgeon: Vickki Hearing, MD;  Location: [facility];  Service: Orthopedics;  Laterality: Right;   TONSILLECTOMY  1981?   TUBAL LIGATION      Current Outpatient Medications  Medication Sig Dispense Refill Last Dose/Taking   ALPRAZolam (XANAX) 1 MG tablet Take 1 mg by mouth 2 (two) times daily as needed for anxiety.      chlorthalidone (HYGROTON) 50 MG tablet TAKE 1/2 TABLET BY MOUTH ONCE A DAY. 45 tablet 0    diclofenac (VOLTAREN) 75 MG EC tablet Take 1 tablet (75 mg total) by mouth 2 (two) times daily. 60 tablet 5    DULoxetine (CYMBALTA) 30 MG capsule Take 30 mg by  mouth daily.      DULoxetine (CYMBALTA) 60 MG capsule Take 1 capsule (60 mg total) by mouth daily. 30 capsule 2    Evolocumab (REPATHA) 140 MG/ML SOSY INJECT 140MG  UNDER THE SKIN EVERY 2 WEEKS. 2 mL 2    fluconazole (DIFLUCAN) 150 MG tablet TAKE ONE TABLET BY MOUTH ONCE DAILY. REPEAT DOSE IN 3 DAYS. (Patient not taking: Reported on 02/05/2023) 2 tablet 2     gabapentin (NEURONTIN) 300 MG capsule Take 900 mg by mouth 3 (three) times daily.      HYDROcodone-acetaminophen (NORCO) 10-325 MG tablet Take 0.5-1 tablets by mouth every 6 (six) hours as needed (pain).      Multiple Vitamins-Minerals (CENTRUM SILVER ADULT 50+ PO) Take 1 tablet by mouth daily.      predniSONE (DELTASONE) 20 MG tablet 2 every day for 6 days 12 tablet 0    traZODone (DESYREL) 50 MG tablet Take 0.5-1 tablets (25-50 mg total) by mouth at bedtime as needed. 90 tablet 0    valACYclovir (VALTREX) 1000 MG tablet TAKE (1) TABLET BY MOUTH DAILY. 90 tablet 1    valsartan (DIOVAN) 80 MG tablet TAKE 1 TABLET BY MOUTH ONCE A DAY. 90 tablet 0    No current facility-administered medications for this visit.   Allergies  Allergen Reactions   Lipitor [Atorvastatin] Other (See Comments)    Joint pains severe   Amlodipine Swelling    Social History   Tobacco Use   Smoking status: Every Day    Current packs/day: 0.50    Average packs/day: 0.5 packs/day for 15.0 years (7.5 ttl pk-yrs)    Types: Cigarettes   Smokeless tobacco: Never  Substance Use Topics   Alcohol use: Never    Family History  Problem Relation Age of Onset   Cancer Paternal Grandmother        breast   Cancer Maternal Grandmother        colon   Hyperlipidemia Father    Hypertension Mother    Diabetes Mother    Hyperlipidemia Mother    Thyroid disease Mother    Seizures Mother    Cancer Paternal Aunt        breast   Cancer Paternal Aunt        breast   Cancer Paternal Aunt        breast     Review of Systems  Musculoskeletal:  Positive for arthralgias and gait problem.  All other systems reviewed and are negative.   Objective:  Physical Exam Constitutional:      Appearance: Normal appearance.  HENT:     Head: Normocephalic and atraumatic.     Nose: Nose normal.     Mouth/Throat:     Mouth: Mucous membranes are moist.     Pharynx: Oropharynx is clear.  Eyes:     Conjunctiva/sclera: Conjunctivae  normal.  Cardiovascular:     Rate and Rhythm: Normal rate and regular rhythm.     Pulses: Normal pulses.     Heart sounds: Normal heart sounds.  Pulmonary:     Effort: Pulmonary effort is normal.     Breath sounds: Normal breath sounds.  Abdominal:     General: Abdomen is flat.     Palpations: Abdomen is soft.  Genitourinary:    Comments: deferred Musculoskeletal:     Cervical back: Normal range of motion and neck supple.     Comments: Examination of the right hip reveals no skin wounds or lesions. Mild trochanteric tenderness to palpation. She  has severely restricted range of motion of the right hip. Pain with terminal flexion and rotation. Pain in the position of impingement. Positive Stinchfield.   She is neurovascular tact distally.   She ambulates with an antalgic gait.   Skin:    General: Skin is warm and dry.     Capillary Refill: Capillary refill takes less than 2 seconds.  Neurological:     General: No focal deficit present.     Mental Status: She is alert and oriented to person, place, and time.  Psychiatric:        Mood and Affect: Mood normal.        Behavior: Behavior normal.        Thought Content: Thought content normal.        Judgment: Judgment normal.     Vital signs in last 24 hours: @VSRANGES @  Labs:   Estimated body mass index is 37.99 kg/m as calculated from the following:   Height as of 01/31/23: 5' 9.5" (1.765 m).   Weight as of 01/31/23: 118.4 kg.   Imaging Review Plain radiographs demonstrate severe degenerative joint disease of the right hip(s). The bone quality appears to be adequate for age and reported activity level.      Assessment/Plan:  End stage arthritis, right hip(s)  The patient history, physical examination, clinical judgement of the provider and imaging studies are consistent with end stage degenerative joint disease of the right hip(s) and total hip arthroplasty is deemed medically necessary. The treatment options  including medical management, injection therapy, arthroscopy and arthroplasty were discussed at length. The risks and benefits of total hip arthroplasty were presented and reviewed. The risks due to aseptic loosening, infection, stiffness, dislocation/subluxation,  thromboembolic complications and other imponderables were discussed.  The patient acknowledged the explanation, agreed to proceed with the plan and consent was signed. Patient is being admitted for inpatient treatment for surgery, pain control, PT, OT, prophylactic antibiotics, VTE prophylaxis, progressive ambulation and ADL's and discharge planning.The patient is planning to be discharged home with HEP.   Therapy Plans: HEP.  Disposition: Home with husband.  Planned DVT Prophylaxis: aspirin 81mg  BID DME needed: walker and cane.  PCP: Cleared.  TXA: IV Allergies: NDKA.  Anesthesia Concerns: Nausea and vomiting with prior.  BMI: 40.6 Last HgbA1c: Not diabetic.  Other: - History spinal stenosis.  - Staples** 1/2.  - Currently hydrocodone 10/325mg  at least 1x/day from PCP until surgery.  - Gabapentin and trazadone per PCP.  - Hydrocodone, zofran, methocarbamol, aleve.  - 01/02/23: Hgb 15.7, Cr. 1.09, K+ 4.5. - 01/31/23: Hgb 14.6, K+ 3.6, Cr. 1.01 .     Patient's anticipated LOS is less than 2 midnights, meeting these requirements: - Younger than 46 - Lives within 1 hour of care - Has a competent adult at home to recover with post-op recover - NO history of  - Chronic pain requiring opiods  - Diabetes  - Coronary Artery Disease  - Heart failure  - Heart attack  - Stroke  - DVT/VTE  - Cardiac arrhythmia  - Respiratory Failure/COPD  - Renal failure  - Anemia  - Advanced Liver disease

## 2023-02-05 NOTE — H&P (Signed)
TOTAL HIP ADMISSION H&P  Patient is admitted for right total hip arthroplasty.  Subjective:  Chief Complaint: right hip pain  HPI: Carol Sharp, 47 y.o. female, has a history of pain and functional disability in the right hip(s) due to arthritis and patient has failed non-surgical conservative treatments for greater than 12 weeks to include NSAID's and/or analgesics, flexibility and strengthening excercises, use of assistive devices, weight reduction as appropriate, and activity modification.  Onset of symptoms was gradual starting 10 years ago with rapidlly worsening course since that time.The patient noted no past surgery on the right hip(s).  Patient currently rates pain in the right hip at 10 out of 10 with activity. Patient has night pain, worsening of pain with activity and weight bearing, trendelenberg gait, pain that interfers with activities of daily living, and pain with passive range of motion. Patient has evidence of subchondral cysts, subchondral sclerosis, periarticular osteophytes, and joint space narrowing by imaging studies. This condition presents safety issues increasing the risk of falls.  There is no current active infection.  Patient Active Problem List   Diagnosis Date Noted   Pelvic pain 10/03/2021   Fibroids 10/03/2021   Body mass index 40.0-44.9, adult (HCC) 01/03/2021   Bilateral carpal tunnel syndrome 12/28/2020   Sleep disturbance 11/09/2020   S/P endometrial ablation 11/09/2020   Anxiety and depression 11/09/2020   Elevated transaminase level 05/20/2019   Hyperlipidemia 05/20/2019   GAD (generalized anxiety disorder) 05/20/2019   Insomnia 02/04/2018   S/P arthroscopy of right knee 04/18/17    Primary osteoarthritis of right knee    Morbid obesity (HCC) 07/15/2013   Benign essential hypertension 10/06/2012   Adjustment disorder with mixed anxiety and depressed mood 09/08/2012   Past Medical History:  Diagnosis Date   Anxiety    Arthritis    Breast  nodule 08/02/2015   Depression    Hypertension    MRSA cellulitis 06/2003   PONV (postoperative nausea and vomiting)     Past Surgical History:  Procedure Laterality Date   ABDOMINAL HYSTERECTOMY N/A 01/17/2022   Procedure: HYSTERECTOMY ABDOMINAL; TOTAL WITH BILATERAL SALPINGECTOMY;  Surgeon: Myna Hidalgo, DO;  Location: [facility];  Service: Gynecology;  Laterality: N/A;   ABLATION  06/19/2008   CESAREAN SECTION     CHOLECYSTECTOMY     COLONOSCOPY WITH PROPOFOL N/A 02/16/2021   Procedure: COLONOSCOPY WITH PROPOFOL;  Surgeon: Lanelle Bal, DO;  Location: [facility];  Service: Endoscopy;  Laterality: N/A;  1:00pm   DILATION AND CURETTAGE OF UTERUS  11/20/1998   INCISIONAL HERNIA REPAIR N/A 03/24/2021   Procedure: LAPAROSCOPIC INCISIONAL HERNIA W/MESH;  Surgeon: Franky Macho, MD;  Location: [facility];  Service: General;  Laterality: N/A;   KNEE ARTHROSCOPY WITH MEDIAL MENISECTOMY Right 04/18/2017   Procedure: RIGHT KNEE ARTHROSCOPY WITH PARTIAL  MEDIAL MENISECTOMY;  Surgeon: Vickki Hearing, MD;  Location: [facility];  Service: Orthopedics;  Laterality: Right;   TONSILLECTOMY  1981?   TUBAL LIGATION      Current Outpatient Medications  Medication Sig Dispense Refill Last Dose/Taking   ALPRAZolam (XANAX) 1 MG tablet Take 1 mg by mouth 2 (two) times daily as needed for anxiety.      chlorthalidone (HYGROTON) 50 MG tablet TAKE 1/2 TABLET BY MOUTH ONCE A DAY. 45 tablet 0    diclofenac (VOLTAREN) 75 MG EC tablet Take 1 tablet (75 mg total) by mouth 2 (two) times daily. 60 tablet 5    DULoxetine (CYMBALTA) 30 MG capsule Take 30 mg by  mouth daily.      DULoxetine (CYMBALTA) 60 MG capsule Take 1 capsule (60 mg total) by mouth daily. 30 capsule 2    Evolocumab (REPATHA) 140 MG/ML SOSY INJECT 140MG  UNDER THE SKIN EVERY 2 WEEKS. 2 mL 2    fluconazole (DIFLUCAN) 150 MG tablet TAKE ONE TABLET BY MOUTH ONCE DAILY. REPEAT DOSE IN 3 DAYS. (Patient not taking: Reported on 02/05/2023) 2 tablet 2     gabapentin (NEURONTIN) 300 MG capsule Take 900 mg by mouth 3 (three) times daily.      HYDROcodone-acetaminophen (NORCO) 10-325 MG tablet Take 0.5-1 tablets by mouth every 6 (six) hours as needed (pain).      Multiple Vitamins-Minerals (CENTRUM SILVER ADULT 50+ PO) Take 1 tablet by mouth daily.      predniSONE (DELTASONE) 20 MG tablet 2 every day for 6 days 12 tablet 0    traZODone (DESYREL) 50 MG tablet Take 0.5-1 tablets (25-50 mg total) by mouth at bedtime as needed. 90 tablet 0    valACYclovir (VALTREX) 1000 MG tablet TAKE (1) TABLET BY MOUTH DAILY. 90 tablet 1    valsartan (DIOVAN) 80 MG tablet TAKE 1 TABLET BY MOUTH ONCE A DAY. 90 tablet 0    No current facility-administered medications for this visit.   Allergies  Allergen Reactions   Lipitor [Atorvastatin] Other (See Comments)    Joint pains severe   Amlodipine Swelling    Social History   Tobacco Use   Smoking status: Every Day    Current packs/day: 0.50    Average packs/day: 0.5 packs/day for 15.0 years (7.5 ttl pk-yrs)    Types: Cigarettes   Smokeless tobacco: Never  Substance Use Topics   Alcohol use: Never    Family History  Problem Relation Age of Onset   Cancer Paternal Grandmother        breast   Cancer Maternal Grandmother        colon   Hyperlipidemia Father    Hypertension Mother    Diabetes Mother    Hyperlipidemia Mother    Thyroid disease Mother    Seizures Mother    Cancer Paternal Aunt        breast   Cancer Paternal Aunt        breast   Cancer Paternal Aunt        breast     Review of Systems  Musculoskeletal:  Positive for arthralgias and gait problem.  All other systems reviewed and are negative.   Objective:  Physical Exam Constitutional:      Appearance: Normal appearance.  HENT:     Head: Normocephalic and atraumatic.     Nose: Nose normal.     Mouth/Throat:     Mouth: Mucous membranes are moist.     Pharynx: Oropharynx is clear.  Eyes:     Conjunctiva/sclera: Conjunctivae  normal.  Cardiovascular:     Rate and Rhythm: Normal rate and regular rhythm.     Pulses: Normal pulses.     Heart sounds: Normal heart sounds.  Pulmonary:     Effort: Pulmonary effort is normal.     Breath sounds: Normal breath sounds.  Abdominal:     General: Abdomen is flat.     Palpations: Abdomen is soft.  Genitourinary:    Comments: deferred Musculoskeletal:     Cervical back: Normal range of motion and neck supple.     Comments: Examination of the right hip reveals no skin wounds or lesions. Mild trochanteric tenderness to palpation. She  has severely restricted range of motion of the right hip. Pain with terminal flexion and rotation. Pain in the position of impingement. Positive Stinchfield.   She is neurovascular tact distally.   She ambulates with an antalgic gait.   Skin:    General: Skin is warm and dry.     Capillary Refill: Capillary refill takes less than 2 seconds.  Neurological:     General: No focal deficit present.     Mental Status: She is alert and oriented to person, place, and time.  Psychiatric:        Mood and Affect: Mood normal.        Behavior: Behavior normal.        Thought Content: Thought content normal.        Judgment: Judgment normal.     Vital signs in last 24 hours: @VSRANGES @  Labs:   Estimated body mass index is 37.99 kg/m as calculated from the following:   Height as of 01/31/23: 5' 9.5" (1.765 m).   Weight as of 01/31/23: 118.4 kg.   Imaging Review Plain radiographs demonstrate severe degenerative joint disease of the right hip(s). The bone quality appears to be adequate for age and reported activity level.      Assessment/Plan:  End stage arthritis, right hip(s)  The patient history, physical examination, clinical judgement of the provider and imaging studies are consistent with end stage degenerative joint disease of the right hip(s) and total hip arthroplasty is deemed medically necessary. The treatment options  including medical management, injection therapy, arthroscopy and arthroplasty were discussed at length. The risks and benefits of total hip arthroplasty were presented and reviewed. The risks due to aseptic loosening, infection, stiffness, dislocation/subluxation,  thromboembolic complications and other imponderables were discussed.  The patient acknowledged the explanation, agreed to proceed with the plan and consent was signed. Patient is being admitted for inpatient treatment for surgery, pain control, PT, OT, prophylactic antibiotics, VTE prophylaxis, progressive ambulation and ADL's and discharge planning.The patient is planning to be discharged home with HEP.   Therapy Plans: HEP.  Disposition: Home with husband.  Planned DVT Prophylaxis: aspirin 81mg  BID DME needed: walker and cane.  PCP: Cleared.  TXA: IV Allergies: NDKA.  Anesthesia Concerns: Nausea and vomiting with prior.  BMI: 40.6 Last HgbA1c: Not diabetic.  Other: - History spinal stenosis.  - Staples** 1/2.  - Currently hydrocodone 10/325mg  at least 1x/day from PCP until surgery.  - Gabapentin and trazadone per PCP.  - Hydrocodone, zofran, methocarbamol, aleve.  - 01/02/23: Hgb 15.7, Cr. 1.09, K+ 4.5. - 01/31/23: Hgb 14.6, K+ 3.6, Cr. 1.01 .     Patient's anticipated LOS is less than 2 midnights, meeting these requirements: - Younger than 46 - Lives within 1 hour of care - Has a competent adult at home to recover with post-op recover - NO history of  - Chronic pain requiring opiods  - Diabetes  - Coronary Artery Disease  - Heart failure  - Heart attack  - Stroke  - DVT/VTE  - Cardiac arrhythmia  - Respiratory Failure/COPD  - Renal failure  - Anemia  - Advanced Liver disease

## 2023-02-06 ENCOUNTER — Other Ambulatory Visit: Payer: Self-pay | Admitting: Nurse Practitioner

## 2023-02-06 ENCOUNTER — Other Ambulatory Visit: Payer: Self-pay | Admitting: Family Medicine

## 2023-02-06 ENCOUNTER — Telehealth: Payer: Self-pay

## 2023-02-06 ENCOUNTER — Encounter: Payer: Self-pay | Admitting: Nurse Practitioner

## 2023-02-06 ENCOUNTER — Encounter: Payer: Self-pay | Admitting: Family Medicine

## 2023-02-06 MED ORDER — TRAZODONE HCL 50 MG PO TABS: 25.0000 mg | ORAL_TABLET | Freq: Every evening | ORAL | 0 refills | Status: DC | PRN

## 2023-02-06 MED ORDER — ALPRAZOLAM 1 MG PO TABS: ORAL_TABLET | ORAL | 1 refills | Status: DC

## 2023-02-06 NOTE — Telephone Encounter (Signed)
Medication: ALPRAZolam (XANAX) 1 MG tablet, Evolocumab (REPATHA) 140 MG/ML SOSY

## 2023-02-06 NOTE — Telephone Encounter (Signed)
duplicate

## 2023-02-06 NOTE — Telephone Encounter (Signed)
Alprazolam was sent with the instructions not to take with pain medication

## 2023-02-07 ENCOUNTER — Ambulatory Visit (HOSPITAL_COMMUNITY): Payer: No Typology Code available for payment source

## 2023-02-07 ENCOUNTER — Ambulatory Visit (HOSPITAL_COMMUNITY): Payer: No Typology Code available for payment source | Admitting: Anesthesiology

## 2023-02-07 ENCOUNTER — Encounter (HOSPITAL_COMMUNITY): Payer: Self-pay | Admitting: Orthopedic Surgery

## 2023-02-07 ENCOUNTER — Ambulatory Visit (HOSPITAL_COMMUNITY): Payer: No Typology Code available for payment source | Admitting: Physician Assistant

## 2023-02-07 ENCOUNTER — Other Ambulatory Visit: Payer: Self-pay | Admitting: Nurse Practitioner

## 2023-02-07 ENCOUNTER — Other Ambulatory Visit: Payer: Self-pay

## 2023-02-07 ENCOUNTER — Encounter (HOSPITAL_COMMUNITY)
Admission: RE | Disposition: A | Discharge status: Home / Self Care | Payer: Self-pay | Source: Home / Self Care | Attending: Orthopedic Surgery

## 2023-02-07 ENCOUNTER — Ambulatory Visit (HOSPITAL_COMMUNITY)
Admission: RE | Admit: 2023-02-07 | Discharge: 2023-02-08 | Disposition: A | Discharge status: Home / Self Care | Payer: No Typology Code available for payment source | Attending: Orthopedic Surgery | Admitting: Orthopedic Surgery

## 2023-02-07 DIAGNOSIS — F419 Anxiety disorder, unspecified: Secondary | ICD-10-CM | POA: Diagnosis not present

## 2023-02-07 DIAGNOSIS — Z79899 Other long term (current) drug therapy: Secondary | ICD-10-CM | POA: Diagnosis not present

## 2023-02-07 DIAGNOSIS — M1611 Unilateral primary osteoarthritis, right hip: Secondary | ICD-10-CM

## 2023-02-07 DIAGNOSIS — F32A Depression, unspecified: Secondary | ICD-10-CM | POA: Insufficient documentation

## 2023-02-07 DIAGNOSIS — Z01818 Encounter for other preprocedural examination: Secondary | ICD-10-CM

## 2023-02-07 DIAGNOSIS — F17210 Nicotine dependence, cigarettes, uncomplicated: Secondary | ICD-10-CM | POA: Diagnosis not present

## 2023-02-07 DIAGNOSIS — Z96641 Presence of right artificial hip joint: Secondary | ICD-10-CM | POA: Diagnosis present

## 2023-02-07 DIAGNOSIS — I10 Essential (primary) hypertension: Secondary | ICD-10-CM | POA: Insufficient documentation

## 2023-02-07 HISTORY — PX: TOTAL HIP ARTHROPLASTY: SHX124

## 2023-02-07 LAB — TYPE AND SCREEN
ABO/RH(D): A POS
Antibody Screen: NEGATIVE

## 2023-02-07 SURGERY — ARTHROPLASTY, HIP, TOTAL, ANTERIOR APPROACH
Anesthesia: Spinal | Site: Hip | Laterality: Right

## 2023-02-07 MED ORDER — ALUM & MAG HYDROXIDE-SIMETH 200-200-20 MG/5ML PO SUSP: 30.0000 mL | ORAL | Status: DC | PRN

## 2023-02-07 MED ORDER — ALPRAZOLAM 1 MG PO TABS: ORAL_TABLET | ORAL | 0 refills | Status: DC

## 2023-02-07 MED ORDER — LIDOCAINE HCL (PF) 2 % IJ SOLN
INTRAMUSCULAR | Status: AC
  Filled 2023-02-07: qty 5

## 2023-02-07 MED ORDER — SENNA 8.6 MG PO TABS
1.0000 | ORAL_TABLET | Freq: Two times a day (BID) | ORAL | Status: DC
  Administered 2023-02-07 – 2023-02-08 (×2): 8.6 mg via ORAL
  Filled 2023-02-07 (×2): qty 1

## 2023-02-07 MED ORDER — CEFAZOLIN SODIUM-DEXTROSE 2-4 GM/100ML-% IV SOLN
2.0000 g | Freq: Four times a day (QID) | INTRAVENOUS | Status: AC
  Administered 2023-02-07 (×2): 2 g via INTRAVENOUS
  Filled 2023-02-07 (×2): qty 100

## 2023-02-07 MED ORDER — ACETAMINOPHEN 325 MG PO TABS: 325.0000 mg | ORAL_TABLET | Freq: Four times a day (QID) | ORAL | Status: DC | PRN

## 2023-02-07 MED ORDER — HYDROCODONE-ACETAMINOPHEN 7.5-325 MG PO TABS
1.0000 | ORAL_TABLET | ORAL | Status: DC | PRN
  Administered 2023-02-07: 2 via ORAL
  Administered 2023-02-07: 1 via ORAL
  Administered 2023-02-08 (×2): 2 via ORAL
  Filled 2023-02-07 (×3): qty 2

## 2023-02-07 MED ORDER — DULOXETINE HCL 30 MG PO CPEP
30.0000 mg | ORAL_CAPSULE | Freq: Every day | ORAL | Status: DC
Start: 2023-02-08 — End: 2023-02-07

## 2023-02-07 MED ORDER — OXYCODONE HCL 5 MG PO TABS
ORAL_TABLET | ORAL | Status: AC
  Filled 2023-02-07: qty 1

## 2023-02-07 MED ORDER — TRANEXAMIC ACID-NACL 1000-0.7 MG/100ML-% IV SOLN
1000.0000 mg | INTRAVENOUS | Status: AC
  Administered 2023-02-07: 1000 mg via INTRAVENOUS
  Filled 2023-02-07: qty 100

## 2023-02-07 MED ORDER — ONDANSETRON HCL 4 MG/2ML IJ SOLN
INTRAMUSCULAR | Status: AC
  Filled 2023-02-07: qty 2

## 2023-02-07 MED ORDER — OXYCODONE HCL 5 MG/5ML PO SOLN: 5.0000 mg | Freq: Once | ORAL | Status: AC | PRN

## 2023-02-07 MED ORDER — MENTHOL 3 MG MT LOZG: 1.0000 | LOZENGE | OROMUCOSAL | Status: DC | PRN

## 2023-02-07 MED ORDER — BUPIVACAINE IN DEXTROSE 0.75-8.25 % IT SOLN
INTRATHECAL | Status: DC | PRN
  Administered 2023-02-07: 2 mL via INTRATHECAL

## 2023-02-07 MED ORDER — METHOCARBAMOL 500 MG PO TABS
ORAL_TABLET | ORAL | Status: AC
  Filled 2023-02-07: qty 1

## 2023-02-07 MED ORDER — MIDAZOLAM HCL 2 MG/2ML IJ SOLN
INTRAMUSCULAR | Status: DC | PRN
  Administered 2023-02-07: 2 mg via INTRAVENOUS

## 2023-02-07 MED ORDER — LORAZEPAM 2 MG/ML IJ SOLN
0.5000 mg | Freq: Once | INTRAMUSCULAR | Status: AC
  Administered 2023-02-07: 0.5 mg via INTRAVENOUS

## 2023-02-07 MED ORDER — DULOXETINE HCL 60 MG PO CPEP
60.0000 mg | ORAL_CAPSULE | Freq: Every day | ORAL | Status: DC
  Administered 2023-02-08: 60 mg via ORAL
  Filled 2023-02-07: qty 1

## 2023-02-07 MED ORDER — ASPIRIN 81 MG PO CHEW
81.0000 mg | CHEWABLE_TABLET | Freq: Two times a day (BID) | ORAL | Status: DC
  Administered 2023-02-07 – 2023-02-08 (×2): 81 mg via ORAL
  Filled 2023-02-07 (×2): qty 1

## 2023-02-07 MED ORDER — CENTRUM SILVER ADULT 50+ PO TABS: ORAL_TABLET | Freq: Every day | ORAL | Status: DC

## 2023-02-07 MED ORDER — ONDANSETRON HCL 4 MG PO TABS: 4.0000 mg | ORAL_TABLET | Freq: Four times a day (QID) | ORAL | Status: DC | PRN

## 2023-02-07 MED ORDER — FENTANYL CITRATE (PF) 100 MCG/2ML IJ SOLN
INTRAMUSCULAR | Status: DC | PRN
  Administered 2023-02-07 (×4): 25 ug via INTRAVENOUS

## 2023-02-07 MED ORDER — SODIUM CHLORIDE (PF) 0.9 % IJ SOLN
INTRAMUSCULAR | Status: AC
  Filled 2023-02-07: qty 30

## 2023-02-07 MED ORDER — PANTOPRAZOLE SODIUM 40 MG PO TBEC
40.0000 mg | DELAYED_RELEASE_TABLET | Freq: Every day | ORAL | Status: DC
  Administered 2023-02-07 – 2023-02-08 (×2): 40 mg via ORAL
  Filled 2023-02-07 (×2): qty 1

## 2023-02-07 MED ORDER — KETOROLAC TROMETHAMINE 15 MG/ML IJ SOLN
15.0000 mg | Freq: Four times a day (QID) | INTRAMUSCULAR | Status: AC
  Administered 2023-02-07 – 2023-02-08 (×3): 15 mg via INTRAVENOUS
  Filled 2023-02-07 (×3): qty 1

## 2023-02-07 MED ORDER — ONDANSETRON HCL 4 MG/2ML IJ SOLN
INTRAMUSCULAR | Status: DC | PRN
  Administered 2023-02-07: 4 mg via INTRAVENOUS

## 2023-02-07 MED ORDER — CHLORHEXIDINE GLUCONATE 0.12 % MT SOLN
15.0000 mL | Freq: Once | OROMUCOSAL | Status: AC
  Administered 2023-02-07: 15 mL via OROMUCOSAL

## 2023-02-07 MED ORDER — PHENOL 1.4 % MT LIQD: 1.0000 | OROMUCOSAL | Status: DC | PRN

## 2023-02-07 MED ORDER — LORAZEPAM 0.5 MG PO TABS: 0.5000 mg | ORAL_TABLET | Freq: Once | ORAL | Status: DC

## 2023-02-07 MED ORDER — HYDROCODONE-ACETAMINOPHEN 5-325 MG PO TABS: 1.0000 | ORAL_TABLET | ORAL | Status: DC | PRN

## 2023-02-07 MED ORDER — HYDROMORPHONE HCL 1 MG/ML IJ SOLN
0.2500 mg | INTRAMUSCULAR | Status: DC | PRN
  Administered 2023-02-07 (×4): 0.5 mg via INTRAVENOUS

## 2023-02-07 MED ORDER — METOCLOPRAMIDE HCL 5 MG PO TABS: 5.0000 mg | ORAL_TABLET | Freq: Three times a day (TID) | ORAL | Status: DC | PRN

## 2023-02-07 MED ORDER — KETOROLAC TROMETHAMINE 30 MG/ML IJ SOLN
INTRAMUSCULAR | Status: AC
  Filled 2023-02-07: qty 1

## 2023-02-07 MED ORDER — PHENYLEPHRINE HCL-NACL 20-0.9 MG/250ML-% IV SOLN
INTRAVENOUS | Status: DC | PRN
  Administered 2023-02-07: 50 ug/min via INTRAVENOUS

## 2023-02-07 MED ORDER — ISOPROPYL ALCOHOL 70 % SOLN
Status: DC | PRN
  Administered 2023-02-07: 1 via TOPICAL

## 2023-02-07 MED ORDER — LORAZEPAM 0.5 MG PO TABS
0.5000 mg | ORAL_TABLET | Freq: Once | ORAL | Status: DC
  Administered 2023-02-07: 0.5 mg via ORAL
  Filled 2023-02-07: qty 1

## 2023-02-07 MED ORDER — LORAZEPAM 2 MG/ML IJ SOLN
INTRAMUSCULAR | Status: AC
  Filled 2023-02-07: qty 1

## 2023-02-07 MED ORDER — MIDAZOLAM HCL 2 MG/2ML IJ SOLN
INTRAMUSCULAR | Status: AC
  Filled 2023-02-07: qty 2

## 2023-02-07 MED ORDER — FENTANYL CITRATE (PF) 100 MCG/2ML IJ SOLN
INTRAMUSCULAR | Status: AC
  Filled 2023-02-07: qty 2

## 2023-02-07 MED ORDER — DEXAMETHASONE SODIUM PHOSPHATE 10 MG/ML IJ SOLN
INTRAMUSCULAR | Status: AC
  Filled 2023-02-07: qty 1

## 2023-02-07 MED ORDER — KETOROLAC TROMETHAMINE 15 MG/ML IJ SOLN
INTRAMUSCULAR | Status: AC
  Filled 2023-02-07: qty 1

## 2023-02-07 MED ORDER — METOCLOPRAMIDE HCL 5 MG/ML IJ SOLN
5.0000 mg | Freq: Three times a day (TID) | INTRAMUSCULAR | Status: DC | PRN
Start: 2023-02-07 — End: 2023-02-08

## 2023-02-07 MED ORDER — POVIDONE-IODINE 10 % EX SWAB
2.0000 | Freq: Once | CUTANEOUS | Status: DC
  Administered 2023-02-07: 2 via TOPICAL

## 2023-02-07 MED ORDER — SODIUM CHLORIDE (PF) 0.9 % IJ SOLN
INTRAMUSCULAR | Status: DC | PRN
  Administered 2023-02-07: 61 mL

## 2023-02-07 MED ORDER — TRAZODONE HCL 50 MG PO TABS: 25.0000 mg | ORAL_TABLET | Freq: Every evening | ORAL | Status: DC | PRN

## 2023-02-07 MED ORDER — CEFAZOLIN SODIUM-DEXTROSE 2-4 GM/100ML-% IV SOLN
2.0000 g | INTRAVENOUS | Status: AC
  Administered 2023-02-07: 2 g via INTRAVENOUS
  Filled 2023-02-07: qty 100

## 2023-02-07 MED ORDER — HYDROMORPHONE HCL 1 MG/ML IJ SOLN
INTRAMUSCULAR | Status: AC
  Filled 2023-02-07: qty 1

## 2023-02-07 MED ORDER — LACTATED RINGERS IV SOLN: INTRAVENOUS | Status: DC

## 2023-02-07 MED ORDER — ORAL CARE MOUTH RINSE
15.0000 mL | Freq: Once | OROMUCOSAL | Status: AC
Start: 2023-02-07 — End: 2023-02-07

## 2023-02-07 MED ORDER — SODIUM CHLORIDE 0.9 % IR SOLN
Status: DC | PRN
  Administered 2023-02-07 (×2): 1000 mL

## 2023-02-07 MED ORDER — SODIUM CHLORIDE 0.9 % IV SOLN
12.5000 mg | INTRAVENOUS | Status: DC | PRN
  Administered 2023-02-07: 12.5 mg via INTRAVENOUS
  Filled 2023-02-07: qty 12.5

## 2023-02-07 MED ORDER — ADULT MULTIVITAMIN W/MINERALS CH
1.0000 | ORAL_TABLET | Freq: Every day | ORAL | Status: DC
  Administered 2023-02-08: 1 via ORAL
  Filled 2023-02-07: qty 1

## 2023-02-07 MED ORDER — SODIUM CHLORIDE 0.9 % IV SOLN: INTRAVENOUS | Status: DC

## 2023-02-07 MED ORDER — PHENYLEPHRINE HCL (PRESSORS) 10 MG/ML IV SOLN
INTRAVENOUS | Status: AC
  Filled 2023-02-07: qty 1

## 2023-02-07 MED ORDER — WATER FOR IRRIGATION, STERILE IR SOLN
Status: DC | PRN
  Administered 2023-02-07: 1000 mL

## 2023-02-07 MED ORDER — LIDOCAINE HCL (CARDIAC) PF 100 MG/5ML IV SOSY
PREFILLED_SYRINGE | INTRAVENOUS | Status: DC | PRN
  Administered 2023-02-07: 60 mg via INTRAVENOUS

## 2023-02-07 MED ORDER — BUPIVACAINE-EPINEPHRINE 0.25% -1:200000 IJ SOLN
INTRAMUSCULAR | Status: AC
  Filled 2023-02-07: qty 1

## 2023-02-07 MED ORDER — HYDROCODONE-ACETAMINOPHEN 7.5-325 MG PO TABS
ORAL_TABLET | ORAL | Status: AC
  Filled 2023-02-07: qty 1

## 2023-02-07 MED ORDER — PROPOFOL 10 MG/ML IV BOLUS
INTRAVENOUS | Status: DC | PRN
  Administered 2023-02-07: 40 mg via INTRAVENOUS
  Administered 2023-02-07 (×2): 30 mg via INTRAVENOUS

## 2023-02-07 MED ORDER — PROPOFOL 1000 MG/100ML IV EMUL
INTRAVENOUS | Status: AC
Start: 2023-02-07 — End: ?
  Filled 2023-02-07: qty 100

## 2023-02-07 MED ORDER — DOCUSATE SODIUM 100 MG PO CAPS
100.0000 mg | ORAL_CAPSULE | Freq: Two times a day (BID) | ORAL | Status: DC
  Administered 2023-02-07 – 2023-02-08 (×2): 100 mg via ORAL
  Filled 2023-02-07 (×2): qty 1

## 2023-02-07 MED ORDER — PROPOFOL 1000 MG/100ML IV EMUL
INTRAVENOUS | Status: AC
  Filled 2023-02-07: qty 100

## 2023-02-07 MED ORDER — MORPHINE SULFATE (PF) 2 MG/ML IV SOLN
0.5000 mg | INTRAVENOUS | Status: DC | PRN
  Administered 2023-02-07 – 2023-02-08 (×3): 1 mg via INTRAVENOUS
  Filled 2023-02-07 (×3): qty 1

## 2023-02-07 MED ORDER — ACETAMINOPHEN 500 MG PO TABS
1000.0000 mg | ORAL_TABLET | Freq: Once | ORAL | Status: AC
  Administered 2023-02-07: 1000 mg via ORAL
  Filled 2023-02-07: qty 2

## 2023-02-07 MED ORDER — GABAPENTIN 300 MG PO CAPS
900.0000 mg | ORAL_CAPSULE | Freq: Three times a day (TID) | ORAL | Status: DC
  Administered 2023-02-07 – 2023-02-08 (×3): 900 mg via ORAL
  Filled 2023-02-07 (×3): qty 3

## 2023-02-07 MED ORDER — METHOCARBAMOL 1000 MG/10ML IJ SOLN: 500.0000 mg | Freq: Four times a day (QID) | INTRAMUSCULAR | Status: DC | PRN

## 2023-02-07 MED ORDER — OXYCODONE HCL 5 MG PO TABS
5.0000 mg | ORAL_TABLET | Freq: Once | ORAL | Status: AC | PRN
Start: 2023-02-07 — End: 2023-02-07
  Administered 2023-02-07: 5 mg via ORAL

## 2023-02-07 MED ORDER — PROPOFOL 500 MG/50ML IV EMUL
INTRAVENOUS | Status: DC | PRN
  Administered 2023-02-07: 125 ug/kg/min via INTRAVENOUS

## 2023-02-07 MED ORDER — ONDANSETRON HCL 4 MG/2ML IJ SOLN
4.0000 mg | Freq: Four times a day (QID) | INTRAMUSCULAR | Status: DC | PRN
  Administered 2023-02-08 (×2): 4 mg via INTRAVENOUS
  Filled 2023-02-07 (×2): qty 2

## 2023-02-07 MED ORDER — BISACODYL 10 MG RE SUPP: 10.0000 mg | Freq: Every day | RECTAL | Status: DC | PRN

## 2023-02-07 MED ORDER — DEXAMETHASONE SODIUM PHOSPHATE 10 MG/ML IJ SOLN
INTRAMUSCULAR | Status: DC | PRN
  Administered 2023-02-07: 10 mg via INTRAVENOUS

## 2023-02-07 MED ORDER — DIPHENHYDRAMINE HCL 12.5 MG/5ML PO ELIX: 12.5000 mg | ORAL_SOLUTION | ORAL | Status: DC | PRN

## 2023-02-07 MED ORDER — ALPRAZOLAM 0.5 MG PO TABS
1.0000 mg | ORAL_TABLET | Freq: Every evening | ORAL | Status: DC | PRN
  Administered 2023-02-07: 1 mg via ORAL
  Filled 2023-02-07: qty 2

## 2023-02-07 MED ORDER — POLYETHYLENE GLYCOL 3350 17 G PO PACK: 17.0000 g | PACK | Freq: Every day | ORAL | Status: DC | PRN

## 2023-02-07 MED ORDER — METHOCARBAMOL 500 MG PO TABS
500.0000 mg | ORAL_TABLET | Freq: Four times a day (QID) | ORAL | Status: DC | PRN
  Administered 2023-02-07 – 2023-02-08 (×2): 500 mg via ORAL
  Filled 2023-02-07: qty 1

## 2023-02-07 MED ORDER — REPATHA 140 MG/ML ~~LOC~~ SOSY: PREFILLED_SYRINGE | SUBCUTANEOUS | 2 refills | Status: DC

## 2023-02-07 SURGICAL SUPPLY — 48 items
ACE SHELL 54 4H HIP (Shell) ×1 IMPLANT
BAG COUNTER SPONGE SURGICOUNT (BAG) IMPLANT
BAG ZIPLOCK 12X15 (MISCELLANEOUS) IMPLANT
CHLORAPREP W/TINT 26 (MISCELLANEOUS) ×2 IMPLANT
COVER PERINEAL POST (MISCELLANEOUS) ×2 IMPLANT
COVER SURGICAL LIGHT HANDLE (MISCELLANEOUS) ×2 IMPLANT
DERMABOND ADVANCED .7 DNX12 (GAUZE/BANDAGES/DRESSINGS) ×4 IMPLANT
DRAPE IMP U-DRAPE 54X76 (DRAPES) ×2 IMPLANT
DRAPE SHEET LG 3/4 BI-LAMINATE (DRAPES) ×6 IMPLANT
DRAPE STERI IOBAN 125X83 (DRAPES) ×2 IMPLANT
DRAPE U-SHAPE 47X51 STRL (DRAPES) ×2 IMPLANT
DRSG AQUACEL AG ADV 3.5X10 (GAUZE/BANDAGES/DRESSINGS) ×2 IMPLANT
ELECT REM PT RETURN 15FT ADLT (MISCELLANEOUS) ×2 IMPLANT
GAUZE SPONGE 4X4 12PLY STRL (GAUZE/BANDAGES/DRESSINGS) ×2 IMPLANT
GLOVE BIO SURGEON STRL SZ7 (GLOVE) ×2 IMPLANT
GLOVE BIO SURGEON STRL SZ8.5 (GLOVE) ×4 IMPLANT
GLOVE BIOGEL PI IND STRL 7.5 (GLOVE) ×2 IMPLANT
GLOVE BIOGEL PI IND STRL 8.5 (GLOVE) ×2 IMPLANT
GOWN SPEC L3 XXLG W/TWL (GOWN DISPOSABLE) ×2 IMPLANT
GOWN STRL REUS W/ TWL XL LVL3 (GOWN DISPOSABLE) ×2 IMPLANT
HEAD CERAMIC BIOLOX 36 T1 STD (Head) IMPLANT
HOLDER FOLEY CATH W/STRAP (MISCELLANEOUS) ×2 IMPLANT
HOOD PEEL AWAY T7 (MISCELLANEOUS) ×6 IMPLANT
KIT TURNOVER KIT A (KITS) IMPLANT
LINER ACETAB NN G7 F 36 (Liner) IMPLANT
MANIFOLD NEPTUNE II (INSTRUMENTS) ×2 IMPLANT
MARKER SKIN DUAL TIP RULER LAB (MISCELLANEOUS) ×2 IMPLANT
NDL SAFETY ECLIPSE 18X1.5 (NEEDLE) ×2 IMPLANT
NDL SPNL 18GX3.5 QUINCKE PK (NEEDLE) ×2 IMPLANT
NEEDLE SPNL 18GX3.5 QUINCKE PK (NEEDLE) ×1 IMPLANT
PACK ANTERIOR HIP CUSTOM (KITS) ×2 IMPLANT
SAW OSC TIP CART 19.5X105X1.3 (SAW) ×2 IMPLANT
SEALER BIPOLAR AQUA 6.0 (INSTRUMENTS) ×2 IMPLANT
SET HNDPC FAN SPRY TIP SCT (DISPOSABLE) ×2 IMPLANT
SHELL ACETAB 54 4H HIP (Shell) IMPLANT
SOLUTION PRONTOSAN WOUND 350ML (IRRIGATION / IRRIGATOR) ×2 IMPLANT
SPIKE FLUID TRANSFER (MISCELLANEOUS) ×2 IMPLANT
STEM FEMORAL TAPERLOC 13X111 (Stem) IMPLANT
SUT MNCRL AB 3-0 PS2 18 (SUTURE) ×2 IMPLANT
SUT MON AB 2-0 CT1 36 (SUTURE) ×2 IMPLANT
SUT STRATAFIX 14 PDO 48 VLT (SUTURE) ×2 IMPLANT
SUT STRATAFIX PDO 1 14 VIOLET (SUTURE) ×1
SUT VIC AB 2-0 CT1 TAPERPNT 27 (SUTURE) IMPLANT
SYR 3ML LL SCALE MARK (SYRINGE) ×2 IMPLANT
TOWEL GREEN STERILE FF (TOWEL DISPOSABLE) ×2 IMPLANT
TRAY FOLEY MTR SLVR 16FR STAT (SET/KITS/TRAYS/PACK) IMPLANT
TUBE SUCTION HIGH CAP CLEAR NV (SUCTIONS) ×2 IMPLANT
WATER STERILE IRR 1000ML POUR (IV SOLUTION) ×2 IMPLANT

## 2023-02-07 NOTE — Discharge Instructions (Signed)

## 2023-02-07 NOTE — Anesthesia Postprocedure Evaluation (Signed)
Anesthesia Post Note  Patient: Carol Sharp  Procedure(s) Performed: TOTAL HIP ARTHROPLASTY ANTERIOR APPROACH (Right: Hip)     Patient location during evaluation: PACU Anesthesia Type: Spinal Level of consciousness: awake and alert Pain management: pain level controlled Vital Signs Assessment: post-procedure vital signs reviewed and stable Respiratory status: spontaneous breathing, nonlabored ventilation and respiratory function stable Cardiovascular status: blood pressure returned to baseline and stable Postop Assessment: no apparent nausea or vomiting Anesthetic complications: no   No notable events documented.  Last Vitals:  Vitals:   02/07/23 1100 02/07/23 1115  BP: (!) 134/98 (!) 132/97  Pulse: (!) 56 (!) 57  Resp: 12 15  Temp:    SpO2: 97% 97%    Last Pain:  Vitals:   02/07/23 1130  TempSrc:   PainSc: 5                  Lowella Curb

## 2023-02-07 NOTE — Progress Notes (Signed)
Patient requested Xanax 0.5mg  PO be administered. Reports that she takes it twice per day. It is currently ordered as Xanax 1mg  PO at bedtime. Education provided that RN staff can only give meds as per written by medical provider.   Patient states that she has this medication currently available/filled at a local pharmacy and can have family pick it up for her to take. Education provided that this would not be safe/appropriate.  Notified Clint Bolder, [place] for Dr. Linna Caprice regarding patient request. Notified primary RN for further follow-up with patient and provider team.  Haydee Salter, RN 02/07/23 4:51 PM

## 2023-02-07 NOTE — Anesthesia Preprocedure Evaluation (Signed)
Anesthesia Evaluation  Patient identified by MRN, date of birth, ID band Patient awake    Reviewed: Allergy & Precautions, H&P , NPO status , Patient's Chart, lab work & pertinent test results  History of Anesthesia Complications (+) PONV, DIFFICULT AIRWAY and history of anesthetic complications  Airway Mallampati: II  TM Distance: >3 FB Neck ROM: Full    Dental  (+) Dental Advisory Given   Pulmonary Current Smoker and Patient abstained from smoking.   Pulmonary exam normal breath sounds clear to auscultation       Cardiovascular Exercise Tolerance: Good hypertension, Pt. on medications Normal cardiovascular exam Rhythm:Regular Rate:Normal     Neuro/Psych  PSYCHIATRIC DISORDERS Anxiety Depression     Neuromuscular disease    GI/Hepatic negative GI ROS, Neg liver ROS,,,  Endo/Other    Renal/GU negative Renal ROS  negative genitourinary   Musculoskeletal  (+) Arthritis , Osteoarthritis,    Abdominal  (+) + obese  Peds negative pediatric ROS (+)  Hematology negative hematology ROS (+)   Anesthesia Other Findings   Reproductive/Obstetrics negative OB ROS                             Anesthesia Physical Anesthesia Plan  ASA: 2  Anesthesia Plan: Spinal   Post-op Pain Management: Tylenol PO (pre-op)*   Induction: Intravenous  PONV Risk Score and Plan: 2 and Ondansetron, Midazolam and Treatment may vary due to age or medical condition  Airway Management Planned: Simple Face Mask  Additional Equipment:   Intra-op Plan:   Post-operative Plan:   Informed Consent: I have reviewed the patients History and Physical, chart, labs and discussed the procedure including the risks, benefits and alternatives for the proposed anesthesia with the patient or authorized representative who has indicated his/her understanding and acceptance.     Dental advisory given  Plan Discussed with: CRNA  and Surgeon  Anesthesia Plan Comments:         Anesthesia Quick Evaluation

## 2023-02-07 NOTE — Evaluation (Signed)
Physical Therapy Evaluation Patient Details Name: Carol Sharp MRN: [id number] DOB: 04/30/1975 Today's Date: 02/07/2023  History of Present Illness  Pt s/p R THR and with hx of obesity and anxiety  Clinical Impression  Pt s/p R THR and presents with decreased R LE strength/ROM and post op pain limiting functional mobility.  Pt should progress well to dc home with family assist.        If plan is discharge home, recommend the following: A little help with walking and/or transfers;A little help with bathing/dressing/bathroom;Assistance with cooking/housework;Assist for transportation;Help with stairs or ramp for entrance   Can travel by private vehicle        Equipment Recommendations Rolling walker (2 wheels) (may need wide RW 2* body habitus with wide hips and difficulty stepping into standard size)  Recommendations for Other Services       Functional Status Assessment Patient has had a recent decline in their functional status and demonstrates the ability to make significant improvements in function in a reasonable and predictable amount of time.     Precautions / Restrictions Precautions Precautions: Fall Restrictions Weight Bearing Restrictions Per Provider Order: No Other Position/Activity Restrictions: WBAT      Mobility  Bed Mobility Overal bed mobility: Needs Assistance Bed Mobility: Supine to Sit     Supine to sit: Contact guard     General bed mobility comments: for safety    Transfers Overall transfer level: Needs assistance Equipment used: Rolling walker (2 wheels) Transfers: Sit to/from Stand Sit to Stand: Contact guard assist           General transfer comment: cues for use of UEs to self assist    Ambulation/Gait Ambulation/Gait assistance: Contact guard assist Gait Distance (Feet): 80 Feet Assistive device: Rolling walker (2 wheels) Gait Pattern/deviations: Step-to pattern, Decreased step length - right, Decreased step length - left,  Shuffle, Trunk flexed       General Gait Details: cues for posture, position from RW and sequence  Stairs            Wheelchair Mobility     Tilt Bed    Modified Rankin (Stroke Patients Only)       Balance Overall balance assessment: Needs assistance Sitting-balance support: No upper extremity supported, Feet supported Sitting balance-Leahy Scale: Good     Standing balance support: No upper extremity supported Standing balance-Leahy Scale: Fair                               Pertinent Vitals/Pain Pain Assessment Pain Assessment: 0-10 Pain Score: 5  Pain Location: R hip Pain Descriptors / Indicators: Aching, Sore Pain Intervention(s): Limited activity within patient's tolerance, Monitored during session, Premedicated before session, Ice applied    Home Living Family/patient expects to be discharged to:: Private residence Living Arrangements: Spouse/significant other Available Help at Discharge: Family;Available 24 hours/day Type of Home: House Home Access: Stairs to enter Entrance Stairs-Rails: None Entrance Stairs-Number of Steps: 2   Home Layout: One level Home Equipment: None      Prior Function Prior Level of Function : Independent/Modified Independent                     Extremity/Trunk Assessment   Upper Extremity Assessment Upper Extremity Assessment: Overall WFL for tasks assessed    Lower Extremity Assessment Lower Extremity Assessment: RLE deficits/detail    Cervical / Trunk Assessment Cervical / Trunk Assessment: Normal  Communication  Communication Communication: No apparent difficulties  Cognition Arousal: Alert Behavior During Therapy: WFL for tasks assessed/performed Overall Cognitive Status: Within Functional Limits for tasks assessed                                          General Comments      Exercises Total Joint Exercises Ankle Circles/Pumps: AROM, Both, 15 reps, Supine    Assessment/Plan    PT Assessment Patient needs continued PT services  PT Problem List Decreased strength;Decreased range of motion;Decreased activity tolerance;Decreased balance;Decreased mobility;Decreased safety awareness;Decreased knowledge of use of DME;Obesity;Pain       PT Treatment Interventions DME instruction;Gait training;Stair training;Functional mobility training;Therapeutic activities;Therapeutic exercise;Patient/family education    PT Goals (Current goals can be found in the Care Plan section)  Acute Rehab PT Goals Patient Stated Goal: regain IND PT Goal Formulation: With patient Time For Goal Achievement: 02/14/23 Potential to Achieve Goals: Good    Frequency 7X/week     Co-evaluation               AM-PAC PT "6 Clicks" Mobility  Outcome Measure Help needed turning from your back to your side while in a flat bed without using bedrails?: A Little Help needed moving from lying on your back to sitting on the side of a flat bed without using bedrails?: A Little Help needed moving to and from a bed to a chair (including a wheelchair)?: A Little Help needed standing up from a chair using your arms (e.g., wheelchair or bedside chair)?: A Little Help needed to walk in hospital room?: A Little Help needed climbing 3-5 steps with a railing? : A Little 6 Click Score: 18    End of Session Equipment Utilized During Treatment: Gait belt Activity Tolerance: Patient tolerated treatment well Patient left: in chair;with call bell/phone within reach;with chair alarm set;with family/visitor present Nurse Communication: Mobility status PT Visit Diagnosis: Difficulty in walking, not elsewhere classified (R26.2)    Time: 1610-9604 PT Time Calculation (min) (ACUTE ONLY): 20 min   Charges:   PT Evaluation $PT Eval Low Complexity: 1 Low   PT General Charges $$ ACUTE PT VISIT: 1 Visit         Mauro Kaufmann PT Acute Rehabilitation Services Pager  [phone] Office [phone]   Janet Humphreys 02/07/2023, 4:23 PM

## 2023-02-07 NOTE — Interval H&P Note (Signed)
History and Physical Interval Note:  02/07/2023 7:05 AM  Carol Sharp  has presented today for surgery, with the diagnosis of right hip osteoarthritis.  The various methods of treatment have been discussed with the patient and family. After consideration of risks, benefits and other options for treatment, the patient has consented to  Procedure(s): TOTAL HIP ARTHROPLASTY ANTERIOR APPROACH (Right) as a surgical intervention.  The patient's history has been reviewed, patient examined, no change in status, stable for surgery.  I have reviewed the patient's chart and labs.  Questions were answered to the patient's satisfaction.     Iline Oven Latoyna Hird

## 2023-02-07 NOTE — Transfer of Care (Signed)
Immediate Anesthesia Transfer of Care Note  Patient: Carol Sharp  Procedure(s) Performed: TOTAL HIP ARTHROPLASTY ANTERIOR APPROACH (Right: Hip)  Patient Location: PACU  Anesthesia Type:MAC and Spinal  Level of Consciousness: awake, drowsy, and patient cooperative  Airway & Oxygen Therapy: Patient Spontanous Breathing and Patient connected to face mask oxygen  Post-op Assessment: Report given to RN and Post -op Vital signs reviewed and stable  Post vital signs: Reviewed and stable  Last Vitals:  Vitals Value Taken Time  BP 102/80 02/07/23 1000  Temp    Pulse 71 02/07/23 1002  Resp    SpO2 93 % 02/07/23 1002  Vitals shown include unfiled device data.  Last Pain:  Vitals:   02/07/23 0620  TempSrc: Oral  PainSc:       Patients Stated Pain Goal: 5 (02/07/23 0557)  Complications: No notable events documented.

## 2023-02-07 NOTE — Op Note (Signed)
OPERATIVE REPORT  SURGEON: Samson Frederic, MD   ASSISTANT: Clint Bolder, PA-C.  PREOPERATIVE DIAGNOSIS: Right hip arthritis.   POSTOPERATIVE DIAGNOSIS: Right hip arthritis.   PROCEDURE: Right total hip arthroplasty, anterior approach.   IMPLANTS: Biomet Taperloc Complete Microplasty stem, size 13 x 111 mm, high offset. Biomet G7 OsseoTi Cup, size 54 mm. Biomet Vivacit-E liner, size 36 mm, F, neutral. Biomet Biolox ceramic head ball, size 36 + 0 mm.  ANESTHESIA:  MAC and Spinal  ESTIMATED BLOOD LOSS:-450 mL    ANTIBIOTICS: 2g Ancef.  DRAINS: None.  COMPLICATIONS: None.   CONDITION: PACU - hemodynamically stable.   BRIEF CLINICAL NOTE: Carol Sharp is a 47 y.o. female with a long-standing history of Right hip arthritis. After failing conservative management, the patient was indicated for total hip arthroplasty. The risks, benefits, and alternatives to the procedure were explained, and the patient elected to proceed.  PROCEDURE IN DETAIL: Surgical site was marked by myself in the pre-op holding area. Once inside the operating room, spinal anesthesia was obtained, and a foley catheter was inserted. The patient was then positioned on the Hana table.  All bony prominences were well padded.  The hip was prepped and draped in the normal sterile surgical fashion.  A time-out was called verifying side and site of surgery. The patient received IV antibiotics within 60 minutes of beginning the procedure.   Bikini incision was made, and superficial dissection was performed lateral to the ASIS. The direct anterior approach to the hip was performed through the Hueter interval.  Lateral femoral circumflex vessels were treated with the Auqumantys. The anterior capsule was exposed and an inverted T capsulotomy was made. The femoral neck cut was made to the level of the templated cut.  A corkscrew was placed into the head and the head was removed.  The femoral head was found to have eburnated  bone. The head was passed to the back table and was measured. Pubofemoral ligament was released off of the calcar, taking care to stay on bone. Superior capsule was released from the greater trochanter, taking care to stay lateral to the posterior border of the femoral neck in order to preserve the short external rotators.   Acetabular exposure was achieved, and the pulvinar and labrum were excised. Sequential reaming of the acetabulum was then performed up to a size 53 mm reamer. A 54 mm cup was then opened and impacted into place at approximately 40 degrees of abduction and 20 degrees of anteversion. The final polyethylene liner was impacted into place and acetabular osteophytes were removed.    I then gained femoral exposure taking care to protect the abductors and greater trochanter.  This was performed using standard external rotation, extension, and adduction.  A cookie cutter was used to enter the femoral canal, and then the femoral canal finder was placed.  Sequential broaching was performed up to a size 13.  Calcar planer was used on the femoral neck remnant.  I placed a high offset neck and a trial head ball.  The hip was reduced.  Leg lengths and offset were checked fluoroscopically.  The hip was dislocated and trial components were removed.  The final implants were placed, and the hip was reduced.  Fluoroscopy was used to confirm component position and leg lengths.  At 90 degrees of external rotation and full extension, the hip was stable to an anterior directed force.   The wound was copiously irrigated with Prontosan solution and normal saline using pulse lavage.  Marcaine solution was injected into the periarticular soft tissue.  The wound was closed in layers using #1 Vicryl and V-Loc for the fascia, 2-0 Vicryl for the subcutaneous fat, 2-0 Monocryl for the deep dermal layer, 3-0 running Monocryl subcuticular stitch, and Dermabond for the skin.  Once the glue was fully dried, an Aquacell Ag  dressing was applied.  The patient was transported to the recovery room in stable condition.  Sponge, needle, and instrument counts were correct at the end of the case x2.  The patient tolerated the procedure well and there were no known complications.  Please note that a surgical assistant was a medical necessity for this procedure to perform it in a safe and expeditious manner. Assistant was necessary to provide appropriate retraction of vital neurovascular structures, to prevent femoral fracture, and to allow for anatomic placement of the prosthesis.

## 2023-02-07 NOTE — Anesthesia Procedure Notes (Signed)
Spinal  Patient location during procedure: OR Start time: 02/07/2023 7:33 AM End time: 02/07/2023 7:38 AM Reason for block: surgical anesthesia Staffing Performed: anesthesiologist  Anesthesiologist: Lowella Curb, MD Performed by: Lowella Curb, MD Authorized by: Lowella Curb, MD   Preanesthetic Checklist Completed: patient identified, IV checked, site marked, risks and benefits discussed, surgical consent, monitors and equipment checked, pre-op evaluation and timeout performed Spinal Block Patient position: sitting Prep: DuraPrep Patient monitoring: heart rate, cardiac monitor, continuous pulse ox and blood pressure Approach: midline Location: L3-4 Injection technique: single-shot Needle Needle type: Sprotte  Needle gauge: 24 G Needle length: 9 cm Assessment Sensory level: T4 Events: CSF return

## 2023-02-07 NOTE — Plan of Care (Signed)
  Problem: Education: Goal: Knowledge of General Education information will improve Description: Including pain rating scale, medication(s)/side effects and non-pharmacologic comfort measures Outcome: Progressing   Problem: Activity: Goal: Risk for activity intolerance will decrease Outcome: Progressing   Problem: Nutrition: Goal: Adequate nutrition will be maintained Outcome: Progressing   Problem: Coping: Goal: Level of anxiety will decrease Outcome: Progressing   Problem: Pain Management: Goal: General experience of comfort will improve Outcome: Progressing

## 2023-02-08 ENCOUNTER — Encounter (HOSPITAL_COMMUNITY): Payer: Self-pay | Admitting: Orthopedic Surgery

## 2023-02-08 ENCOUNTER — Other Ambulatory Visit (HOSPITAL_COMMUNITY): Payer: Self-pay

## 2023-02-08 DIAGNOSIS — M1611 Unilateral primary osteoarthritis, right hip: Secondary | ICD-10-CM | POA: Diagnosis not present

## 2023-02-08 LAB — GLUCOSE, CAPILLARY: Glucose-Capillary: 88 mg/dL (ref 70–99)

## 2023-02-08 LAB — BASIC METABOLIC PANEL
Anion gap: 7 (ref 5–15)
BUN: 25 mg/dL — ABNORMAL HIGH (ref 6–20)
CO2: 26 mmol/L (ref 22–32)
Calcium: 7.9 mg/dL — ABNORMAL LOW (ref 8.9–10.3)
Chloride: 98 mmol/L (ref 98–111)
Creatinine, Ser: 1 mg/dL (ref 0.44–1.00)
GFR, Estimated: >60 mL/min (ref >60)
Glucose, Bld: 109 mg/dL — ABNORMAL HIGH (ref 70–99)
Potassium: 3.6 mmol/L (ref 3.5–5.1)
Sodium: 131 mmol/L — ABNORMAL LOW (ref 135–145)

## 2023-02-08 LAB — CBC
HCT: 31.9 % — ABNORMAL LOW (ref 36.0–46.0)
Hemoglobin: 11.6 g/dL — ABNORMAL LOW (ref 12.0–15.0)
MCH: 34.4 pg — ABNORMAL HIGH (ref 26.0–34.0)
MCHC: 36.4 g/dL — ABNORMAL HIGH (ref 30.0–36.0)
MCV: 94.7 fL (ref 80.0–100.0)
Platelets: 225 10*3/uL (ref 150–400)
RBC: 3.37 MIL/uL — ABNORMAL LOW (ref 3.87–5.11)
RDW: 12.5 % (ref 11.5–15.5)
WBC: 12.8 10*3/uL — ABNORMAL HIGH (ref 4.0–10.5)
nRBC: 0 % (ref 0.0–0.2)

## 2023-02-08 MED ORDER — POLYETHYLENE GLYCOL 3350 17 GM/SCOOP PO POWD
17.0000 g | Freq: Every day | ORAL | 0 refills | Status: AC | PRN
  Filled 2023-02-08: qty 238, 14d supply, fill #0

## 2023-02-08 MED ORDER — ONDANSETRON HCL 4 MG PO TABS
4.0000 mg | ORAL_TABLET | Freq: Three times a day (TID) | ORAL | 0 refills | Status: DC | PRN
  Filled 2023-02-08: qty 30, 10d supply, fill #0

## 2023-02-08 MED ORDER — METHOCARBAMOL 500 MG PO TABS
500.0000 mg | ORAL_TABLET | Freq: Four times a day (QID) | ORAL | 0 refills | Status: DC | PRN
  Filled 2023-02-08: qty 20, 5d supply, fill #0

## 2023-02-08 MED ORDER — ASPIRIN 81 MG PO CHEW
81.0000 mg | CHEWABLE_TABLET | Freq: Two times a day (BID) | ORAL | 0 refills | Status: AC
  Filled 2023-02-08: qty 90, 45d supply, fill #0

## 2023-02-08 MED ORDER — HYDROCODONE-ACETAMINOPHEN 5-325 MG PO TABS
1.0000 | ORAL_TABLET | ORAL | 0 refills | Status: AC | PRN
  Filled 2023-02-08: qty 42, 7d supply, fill #0

## 2023-02-08 MED ORDER — DOCUSATE SODIUM 100 MG PO CAPS
100.0000 mg | ORAL_CAPSULE | Freq: Two times a day (BID) | ORAL | 0 refills | Status: AC
  Filled 2023-02-08: qty 60, 30d supply, fill #0

## 2023-02-08 MED ORDER — SENNA 8.6 MG PO TABS
2.0000 | ORAL_TABLET | Freq: Every day | ORAL | 0 refills | Status: AC
Start: 2023-02-08 — End: 2023-02-23
  Filled 2023-02-08: qty 30, 15d supply, fill #0

## 2023-02-08 NOTE — Progress Notes (Signed)
Carol Sharp to be D/C'd home per MD order. Discussed with the patient and all questions fully answered. Medications delivered to room. Skin clean and dry without evidence of skin break down, no evidence of skin tears noted. Dressing to L hip clean and intact. IV catheter discontinued intact. Site without signs and symptoms of complications. Dressing and pressure applied.  An After Visit Summary was printed and given to the patient.  Patient escorted via WC, and D/C home via private auto.  Jon Gills  02/08/2023

## 2023-02-08 NOTE — TOC Transition Note (Signed)
Transition of Care [facility]) - Discharge Note   Patient Details  Name: Carol Sharp MRN: [id number] Date of Birth: 1975-11-15  Transition of Care [facility]) CM/SW Contact:  Amada Jupiter, LCSW Phone Number: 02/08/2023, 10:11 AM   Clinical Narrative:     Met with pt who confirms need for RW and no DME agency preference - order placed with [employer] and item delivered to room.  Plan for HEP.  No further TOC needs.   Final next level of care: Home/Self Care Barriers to Discharge: No Barriers Identified   Patient Goals and CMS Choice Patient states their goals for this hospitalization and ongoing recovery are:: return home          Discharge Placement                       Discharge Plan and Services Additional resources added to the After Visit Summary for                  DME Arranged: Walker rolling DME Agency: [employer] Date DME Agency Contacted: 02/08/23 Time DME Agency Contacted: 1011 Representative spoke with at DME Agency: Loraine Leriche            Social Drivers of Health (SDOH) Interventions SDOH Screenings   Food Insecurity: No Food Insecurity (02/07/2023)  Housing: Low Risk  (02/07/2023)  Transportation Needs: No Transportation Needs (02/07/2023)  Utilities: Not At Risk (02/07/2023)  Depression (PHQ2-9): High Risk (01/02/2023)  Financial Resource Strain: Medium Risk (11/09/2020)  Physical Activity: Sufficiently Active (11/09/2020)  Social Connections: Moderately Integrated (11/09/2020)  Stress: Stress Concern Present (11/09/2020)  Tobacco Use: High Risk (02/07/2023)     Readmission Risk Interventions     No data to display

## 2023-02-08 NOTE — Progress Notes (Signed)
    Subjective:  Patient reports pain as mild to moderate.  Denies N/V/CP/SOB/Abd pain. Denies any tingling or numbness in LE bilaterally.   Objective:   VITALS:   Vitals:   02/07/23 2328 02/08/23 0155 02/08/23 0627 02/08/23 1017  BP: 116/80 132/84 (!) 145/93 139/82  Pulse: 68 61 69 71  Resp: 15 14 16 20   Temp: 98.3 F (36.8 C) 97.9 F (36.6 C) 97.6 F (36.4 C) 97.9 F (36.6 C)  TempSrc: Oral Oral    SpO2: 94% 93% 99% 94%  Weight:      Height:        NAD Neurologically intact ABD soft Neurovascular intact Sensation intact distally Intact pulses distally Dorsiflexion/Plantar flexion intact Incision: dressing C/D/I No cellulitis present Compartment soft   Lab Results  Component Value Date   WBC 12.8 (H) 02/08/2023   HGB 11.6 (L) 02/08/2023   HCT 31.9 (L) 02/08/2023   MCV 94.7 02/08/2023   PLT 225 02/08/2023   BMET    Component Value Date/Time   NA 131 (L) 02/08/2023 0339   NA 139 01/02/2023 1012   K 3.6 02/08/2023 0339   CL 98 02/08/2023 0339   CO2 26 02/08/2023 0339   GLUCOSE 109 (H) 02/08/2023 0339   BUN 25 (H) 02/08/2023 0339   BUN 16 01/02/2023 1012   CREATININE 1.00 02/08/2023 0339   CALCIUM 7.9 (L) 02/08/2023 0339   EGFR 63 01/02/2023 1012   GFRNONAA >60 02/08/2023 0339     Assessment/Plan: 1 Day Post-Op   Principal Problem:   Primary osteoarthritis of right hip Active Problems:   S/P total right hip arthroplasty   WBAT with walker DVT ppx: Aspirin, SCDs, TEDS PO pain control PT/OT: 80 feet with PT yesterday. Continue today.  Dispo: D/c home once cleared with PT.    Clois Dupes 02/08/2023, 1:14 PM   [facility]  [place] [address], [address], [place], [place] [address] Phone: [phone] [email or web address] Facebook  [employer]

## 2023-02-08 NOTE — Progress Notes (Signed)
Physical Therapy Treatment Patient Details Name: Carol Sharp MRN: [id number] DOB: 01-Aug-1975 Today's Date: 02/08/2023   History of Present Illness Pt s/p R THR and with hx of obesity and anxiety    PT Comments  POD # 1 pm session with Spouse Assisted OOB to mab to bathroom then in hallway to portable stairs.  With Spouse prtecticed up/down 2 steps NO rails using walker.  Performed well.  Then returned to room to perform some TE's following HEP handout.  Instructed on proper tech, freq as well as use of ICE.   Addressed all mobility questions, discussed appropriate activity, educated on use of ICE.  Pt ready for D/C to home.    If plan is discharge home, recommend the following: A little help with walking and/or transfers;A little help with bathing/dressing/bathroom;Assistance with cooking/housework;Assist for transportation;Help with stairs or ramp for entrance   Can travel by private vehicle        Equipment Recommendations  Rolling walker (2 wheels)    Recommendations for Other Services       Precautions / Restrictions Precautions Precautions: Fall Restrictions Weight Bearing Restrictions Per Provider Order: No Other Position/Activity Restrictions: WBAT     Mobility  Bed Mobility Overal bed mobility: Needs Assistance Bed Mobility: Supine to Sit     Supine to sit: Supervision     General bed mobility comments: demonstarted and instructed how to use belt to self assist LE    Transfers Overall transfer level: Needs assistance Equipment used: Rolling walker (2 wheels) Transfers: Sit to/from Stand Sit to Stand: Supervision           General transfer comment: good safety cognition and use of hands to steady self    Ambulation/Gait Ambulation/Gait assistance: Supervision, Contact guard assist Gait Distance (Feet): 35 Feet Assistive device: Rolling walker (2 wheels) Gait Pattern/deviations: Step-to pattern, Decreased step length - right, Decreased step  length - left, Shuffle, Trunk flexed Gait velocity: decreased     General Gait Details: decreased amb distance due to Tx focus stair training with Spouse.   Stairs Stairs: Yes   Stair Management: No rails, Step to pattern, Forwards, With walker Number of Stairs: 2 General stair comments: up 2 steps NO rails forward with walker 50% VC's on proper tech and safety.  Repeated twice.   Wheelchair Mobility     Tilt Bed    Modified Rankin (Stroke Patients Only)       Balance                                            Cognition Arousal: Alert Behavior During Therapy: WFL for tasks assessed/performed Overall Cognitive Status: Within Functional Limits for tasks assessed                                 General Comments: AxO X 3 pleasant and eager to D/C        Exercises      General Comments        Pertinent Vitals/Pain Pain Assessment Pain Assessment: 0-10 Pain Score: 3  Pain Location: R hip Pain Descriptors / Indicators: Aching, Sore Pain Intervention(s): Monitored during session, Premedicated before session, Repositioned, Ice applied    Home Living  Prior Function            PT Goals (current goals can now be found in the care plan section) Progress towards PT goals: Progressing toward goals    Frequency    7X/week      PT Plan      Co-evaluation              AM-PAC PT "6 Clicks" Mobility   Outcome Measure  Help needed turning from your back to your side while in a flat bed without using bedrails?: None Help needed moving from lying on your back to sitting on the side of a flat bed without using bedrails?: None Help needed moving to and from a bed to a chair (including a wheelchair)?: None Help needed standing up from a chair using your arms (e.g., wheelchair or bedside chair)?: None Help needed to walk in hospital room?: None Help needed climbing 3-5 steps with a  railing? : A Little 6 Click Score: 23    End of Session Equipment Utilized During Treatment: Gait belt Activity Tolerance: Patient tolerated treatment well Patient left: in chair;with call bell/phone within reach;with chair alarm set;with family/visitor present Nurse Communication: Mobility status PT Visit Diagnosis: Difficulty in walking, not elsewhere classified (R26.2)     Time: 8119-1478 PT Time Calculation (min) (ACUTE ONLY): 22 min  Charges:    $Gait Training: 8-22 mins $Therapeutic Activity: 8-22 mins PT General Charges $$ ACUTE PT VISIT: 1 Visit                     {Leodis Alcocer  PTA Acute  Rehabilitation Services Office M-F          [phone]

## 2023-02-08 NOTE — Discharge Summary (Signed)
Physician Discharge Summary  Patient ID: Carol Sharp MRN: [id number] DOB/AGE: 48-Apr-1977 48 y.o.  Admit date: 02/07/2023 Discharge date: 02/08/2023  Admission Diagnoses:  Primary osteoarthritis of right hip  Discharge Diagnoses:  Principal Problem:   Primary osteoarthritis of right hip Active Problems:   S/P total right hip arthroplasty   Past Medical History:  Diagnosis Date   Anxiety    Arthritis    Breast nodule 08/02/2015   Depression    Hypertension    MRSA cellulitis 06/2003   PONV (postoperative nausea and vomiting)     Surgeries: Procedure(s): TOTAL HIP ARTHROPLASTY ANTERIOR APPROACH on 02/07/2023   Consultants (if any):   Discharged Condition: Improved  Hospital Course: Carol Sharp is an 47 y.o. female who was admitted 02/07/2023 with a diagnosis of Primary osteoarthritis of right hip and went to the operating room on 02/07/2023 and underwent the above named procedures.    She was given perioperative antibiotics:  Anti-infectives (From admission, onward)    Start     Dose/Rate Route Frequency Ordered Stop   02/07/23 1530  ceFAZolin (ANCEF) IVPB 2g/100 mL premix        2 g 200 mL/hr over 30 Minutes Intravenous Every 6 hours 02/07/23 1436 02/07/23 2119   02/07/23 0600  ceFAZolin (ANCEF) IVPB 2g/100 mL premix        2 g 200 mL/hr over 30 Minutes Intravenous On call to O.R. 02/07/23 0547 02/07/23 0747       She was given sequential compression devices, early ambulation, and apirin for DVT prophylaxis.  POD#1 She ambulated 35 feet with PT. D/c home with HEP.   She benefited maximally from the hospital stay and there were no complications.    Recent vital signs:  Vitals:   02/08/23 1017 02/08/23 1316  BP: 139/82 130/72  Pulse: 71 69  Resp: 20 16  Temp: 97.9 F (36.6 C) (!) 97.4 F (36.3 C)  SpO2: 94% 96%    Recent laboratory studies:  Lab Results  Component Value Date   HGB 11.6 (L) 02/08/2023   HGB 14.6 01/31/2023   HGB 15.7  01/02/2023   Lab Results  Component Value Date   WBC 12.8 (H) 02/08/2023   PLT 225 02/08/2023   No results found for: "INR" Lab Results  Component Value Date   NA 131 (L) 02/08/2023   K 3.6 02/08/2023   CL 98 02/08/2023   CO2 26 02/08/2023   BUN 25 (H) 02/08/2023   CREATININE 1.00 02/08/2023   GLUCOSE 109 (H) 02/08/2023     Allergies as of 02/08/2023       Reactions   Lipitor [atorvastatin] Other (See Comments)   Joint pains-  severe   Amlodipine Swelling   Sulfa Antibiotics Hives        Medication List     STOP taking these medications    predniSONE 20 MG tablet Commonly known as: DELTASONE       TAKE these medications    ALPRAZolam 1 MG tablet Commonly known as: XANAX 1 at bedtime prn insomnia Do not take with narcotics   Aspirin Low Dose 81 MG chewable tablet Generic drug: aspirin Chew 1 tablet (81 mg total) by mouth 2 (two) times daily with a meal.   chlorthalidone 50 MG tablet Commonly known as: HYGROTON TAKE 1/2 TABLET BY MOUTH ONCE A DAY. What changed:  how much to take how to take this when to take this additional instructions   diclofenac 75 MG EC tablet Commonly known  as: VOLTAREN Take 1 tablet (75 mg total) by mouth 2 (two) times daily. What changed:  how much to take when to take this   docusate sodium 100 MG capsule Commonly known as: Colace Take 1 capsule (100 mg total) by mouth 2 (two) times daily.   DULoxetine 60 MG capsule Commonly known as: Cymbalta Take 1 capsule (60 mg total) by mouth daily.   fluconazole 150 MG tablet Commonly known as: DIFLUCAN Take 150 mg by mouth daily as needed (AS DIRECTED- for intermittent yeast infections).   gabapentin 300 MG capsule Commonly known as: NEURONTIN Take 900 mg by mouth 3 (three) times daily with meals.   HYDROcodone-acetaminophen 5-325 MG tablet Commonly known as: NORCO/VICODIN Take 1 tablet by mouth every 4 (four) hours as needed for up to 7 days for moderate pain (pain  score 4-6) or severe pain (pain score 7-10).   methocarbamol 500 MG tablet Commonly known as: ROBAXIN Take 1 tablet (500 mg total) by mouth every 6 (six) hours as needed.   ondansetron 4 MG tablet Commonly known as: Zofran Take 1 tablet (4 mg total) by mouth every 8 (eight) hours as needed for nausea or vomiting.   polyethylene glycol powder 17 GM/SCOOP powder Commonly known as: MiraLax Take 17 g by mouth daily as needed for mild constipation or moderate constipation. Mix with liquid as directed.   Repatha 140 MG/ML Sosy Generic drug: Evolocumab INJECT 140MG  UNDER THE SKIN EVERY 2 WEEKS.   senna 8.6 MG Tabs tablet Commonly known as: SENOKOT Take 2 tablets (17.2 mg total) by mouth at bedtime for 15 days.   traZODone 50 MG tablet Commonly known as: DESYREL Take 0.5-1 tablets (25-50 mg total) by mouth at bedtime as needed. What changed:  how much to take when to take this   valACYclovir 1000 MG tablet Commonly known as: VALTREX TAKE (1) TABLET BY MOUTH DAILY. What changed:  how much to take how to take this when to take this additional instructions   valsartan 80 MG tablet Commonly known as: DIOVAN TAKE 1 TABLET BY MOUTH ONCE A DAY. What changed: when to take this               Discharge Care Instructions  (From admission, onward)           Start     Ordered   02/08/23 0000  Weight bearing as tolerated        02/08/23 1318   02/08/23 0000  Change dressing       Comments: Do not change your dressing.   02/08/23 1318              WEIGHT BEARING   Weight bearing as tolerated with assist device (walker, cane, etc) as directed, use it as long as suggested by your surgeon or therapist, typically at least 4-6 weeks.   EXERCISES  Results after joint replacement surgery are often greatly improved when you follow the exercise, range of motion and muscle strengthening exercises prescribed by your doctor. Safety measures are also important to protect  the joint from further injury. Any time any of these exercises cause you to have increased pain or swelling, decrease what you are doing until you are comfortable again and then slowly increase them. If you have problems or questions, call your caregiver or physical therapist for advice.   Rehabilitation is important following a joint replacement. After just a few days of immobilization, the muscles of the leg can become weakened and shrink (atrophy).  These exercises are designed to build up the tone and strength of the thigh and leg muscles and to improve motion. Often times heat used for twenty to thirty minutes before working out will loosen up your tissues and help with improving the range of motion but do not use heat for the first two weeks following surgery (sometimes heat can increase post-operative swelling).   These exercises can be done on a training (exercise) mat, on the floor, on a table or on a bed. Use whatever works the best and is most comfortable for you.    Use music or television while you are exercising so that the exercises are a pleasant break in your day. This will make your life better with the exercises acting as a break in your routine that you can look forward to.   Perform all exercises about fifteen times, three times per day or as directed.  You should exercise both the operative leg and the other leg as well.  Exercises include:   Quad Sets - Tighten up the muscle on the front of the thigh (Quad) and hold for 5-10 seconds.   Straight Leg Raises - With your knee straight (if you were given a brace, keep it on), lift the leg to 60 degrees, hold for 3 seconds, and slowly lower the leg.  Perform this exercise against resistance later as your leg gets stronger.  Leg Slides: Lying on your back, slowly slide your foot toward your buttocks, bending your knee up off the floor (only go as far as is comfortable). Then slowly slide your foot back down until your leg is flat on the  floor again.  Angel Wings: Lying on your back spread your legs to the side as far apart as you can without causing discomfort.  Hamstring Strength:  Lying on your back, push your heel against the floor with your leg straight by tightening up the muscles of your buttocks.  Repeat, but this time bend your knee to a comfortable angle, and push your heel against the floor.  You may put a pillow under the heel to make it more comfortable if necessary.   A rehabilitation program following joint replacement surgery can speed recovery and prevent re-injury in the future due to weakened muscles. Contact your doctor or a physical therapist for more information on knee rehabilitation.    CONSTIPATION  Constipation is defined medically as fewer than three stools per week and severe constipation as less than one stool per week.  Even if you have a regular bowel pattern at home, your normal regimen is likely to be disrupted due to multiple reasons following surgery.  Combination of anesthesia, postoperative narcotics, change in appetite and fluid intake all can affect your bowels.   YOU MUST use at least one of the following options; they are listed in order of increasing strength to get the job done.  They are all available over the counter, and you may need to use some, POSSIBLY even all of these options:    Drink plenty of fluids (prune juice may be helpful) and high fiber foods Colace 100 mg by mouth twice a day  Senokot for constipation as directed and as needed Dulcolax (bisacodyl), take with full glass of water  Miralax (polyethylene glycol) once or twice a day as needed.  If you have tried all these things and are unable to have a bowel movement in the first 3-4 days after surgery call either your surgeon or your primary doctor.  If you experience loose stools or diarrhea, hold the medications until you stool forms back up.  If your symptoms do not get better within 1 week or if they get worse, check  with your doctor.  If you experience "the worst abdominal pain ever" or develop nausea or vomiting, please contact the office immediately for further recommendations for treatment.   ITCHING:  If you experience itching with your medications, try taking only a single pain pill, or even half a pain pill at a time.  You can also use Benadryl over the counter for itching or also to help with sleep.   TED HOSE STOCKINGS:  Use stockings on both legs until for at least 2 weeks or as directed by physician office. They may be removed at night for sleeping.  MEDICATIONS:  See your medication summary on the "After Visit Summary" that nursing will review with you.  You may have some home medications which will be placed on hold until you complete the course of blood thinner medication.  It is important for you to complete the blood thinner medication as prescribed.  PRECAUTIONS:  If you experience chest pain or shortness of breath - call 911 immediately for transfer to the hospital emergency department.   If you develop a fever greater that 101 F, purulent drainage from wound, increased redness or drainage from wound, foul odor from the wound/dressing, or calf pain - CONTACT YOUR SURGEON.                                                   FOLLOW-UP APPOINTMENTS:  If you do not already have a post-op appointment, please call the office for an appointment to be seen by your surgeon.  Guidelines for how soon to be seen are listed in your "After Visit Summary", but are typically between 1-4 weeks after surgery.  OTHER INSTRUCTIONS:   Knee Replacement:  Do not place pillow under knee, focus on keeping the knee straight while resting. CPM instructions: 0-90 degrees, 2 hours in the morning, 2 hours in the afternoon, and 2 hours in the evening. Place foam block, curve side up under heel at all times except when in CPM or when walking.  DO NOT modify, tear, cut, or change the foam block in any way.   MAKE SURE YOU:   Understand these instructions.  Get help right away if you are not doing well or get worse.    Thank you for letting us be a part of your medical care team.  It is a privilege we respect greatly.  We hope these instructions will help you stay on track for a fast and full recovery!   Diagnostic Studies: DG Pelvis Portable Result Date: 02/07/2023 CLINICAL DATA:  Status post total right hip arthroplasty. EXAM: PORTABLE PELVIS 1-2 VIEWS COMPARISON:  MR right hip 08/08/2021, pelvis and right hip radiographs 07/20/2021 FINDINGS: Interval total right hip arthroplasty. No perihardware lucency is seen to indicate hardware failure or loosening. Expected postoperative changes of lateral right pelvis and thigh greater than right groin subcutaneous air. Mild superior left femoroacetabular joint space narrowing and moderate superior left acetabular and mild superior left femoral head subchondral sclerosis. No acute fracture or dislocation. IMPRESSION: Interval total right hip arthroplasty without evidence of hardware failure. Electronically Signed   By: Neita Garnet M.D.   On: 02/07/2023  11:41   DG HIP UNILAT WITH PELVIS 1V RIGHT Result Date: 02/07/2023 CLINICAL DATA:  Intraoperative fluoroscopy for total right hip arthroplasty. EXAM: DG HIP (WITH OR WITHOUT PELVIS) 1V RIGHT COMPARISON:  Pelvis and right hip radiographs 07/20/2021 FINDINGS: Images were performed intraoperatively without the presence of a radiologist. New total right hip arthroplasty. No hardware complication is seen. Total fluoroscopy images: Total fluoroscopy time: 13 seconds Total dose: Radiation Exposure Index (as provided by the fluoroscopic device): 3.0 mGy air Kerma Please see intraoperative findings for further detail. IMPRESSION: Intraoperative fluoroscopy for total right hip arthroplasty. Electronically Signed   By: Neita Garnet M.D.   On: 02/07/2023 11:08   DG C-Arm 1-60 Min-No Report Result Date: 02/07/2023 Fluoroscopy was utilized  by the requesting physician.  No radiographic interpretation.   DG C-Arm 1-60 Min-No Report Result Date: 02/07/2023 Fluoroscopy was utilized by the requesting physician.  No radiographic interpretation.    Disposition: Discharge disposition: 01-Home or Self Care       Discharge Instructions     Call MD / Call 911   Complete by: As directed    If you experience chest pain or shortness of breath, CALL 911 and be transported to the hospital emergency room.  If you develope a fever above 101 F, pus (white drainage) or increased drainage or redness at the wound, or calf pain, call your surgeon's office.   Change dressing   Complete by: As directed    Do not change your dressing.   Constipation Prevention   Complete by: As directed    Drink plenty of fluids.  Prune juice may be helpful.  You may use a stool softener, such as Colace (over the counter) 100 mg twice a day.  Use MiraLax (over the counter) for constipation as needed.   Diet - low sodium heart healthy   Complete by: As directed    Discharge instructions   Complete by: As directed    Elevate toes above nose. Use cyrotherapy as needed for pain and swelling.   Driving restrictions   Complete by: As directed    No driving for 6 weeks   Increase activity slowly as tolerated   Complete by: As directed    Lifting restrictions   Complete by: As directed    No lifting for 6 weeks   Post-operative opioid taper instructions:   Complete by: As directed    POST-OPERATIVE OPIOID TAPER INSTRUCTIONS: It is important to wean off of your opioid medication as soon as possible. If you do not need pain medication after your surgery it is ok to stop day one. Opioids include: Codeine, Hydrocodone(Norco, Vicodin), Oxycodone(Percocet, oxycontin) and hydromorphone amongst others.  Long term and even short term use of opiods can cause: Increased pain response Dependence Constipation Depression Respiratory depression And more.  Withdrawal  symptoms can include Flu like symptoms Nausea, vomiting And more Techniques to manage these symptoms Hydrate well Eat regular healthy meals Stay active Use relaxation techniques(deep breathing, meditating, yoga) Do Not substitute Alcohol to help with tapering If you have been on opioids for less than two weeks and do not have pain than it is ok to stop all together.  Plan to wean off of opioids This plan should start within one week post op of your joint replacement. Maintain the same interval or time between taking each dose and first decrease the dose.  Cut the total daily intake of opioids by one tablet each day Next start to increase the time between doses.  The last dose that should be eliminated is the evening dose.      TED hose   Complete by: As directed    Use stockings (TED hose) for 2 weeks on both leg(s).  You may remove them at night for sleeping.   Weight bearing as tolerated   Complete by: As directed         Follow-up Information     Clois Dupes, PA-C. Schedule an appointment as soon as possible for a visit in 2 week(s).   Specialty: Orthopedic Surgery Why: For suture removal, For wound re-check Contact information: [address], [address] [place] [address] [phone]                  Signed: Clois Dupes 02/08/2023, 4:02 PM

## 2023-02-08 NOTE — Progress Notes (Signed)
Physical Therapy Treatment Patient Details Name: Carol Sharp MRN: [id number] DOB: 08-May-1975 Today's Date: 02/08/2023   History of Present Illness Pt s/p R THR and with hx of obesity and anxiety    PT Comments  POD # 1 am session AxO x 3 pleasant and motivated [place].  Assisted OOB to amb to bathroom then in hallway.  Practiced stairs.  General stair comments: up 2 steps NO rails forward with walker 50% VC's on proper tech and safety.  Repeated twice. Given handout HEP.  Applied ICE. Pt will need another PT session with spouse for stairs.    If plan is discharge home, recommend the following: A little help with walking and/or transfers;A little help with bathing/dressing/bathroom;Assistance with cooking/housework;Assist for transportation;Help with stairs or ramp for entrance   Can travel by private vehicle        Equipment Recommendations  Rolling walker (2 wheels)    Recommendations for Other Services       Precautions / Restrictions Precautions Precautions: Fall Restrictions Weight Bearing Restrictions Per Provider Order: No Other Position/Activity Restrictions: WBAT     Mobility  Bed Mobility Overal bed mobility: Needs Assistance Bed Mobility: Supine to Sit     Supine to sit: Supervision     General bed mobility comments: demonstarted and instructed how to use belt to self assist LE    Transfers Overall transfer level: Needs assistance Equipment used: Rolling walker (2 wheels) Transfers: Sit to/from Stand Sit to Stand: Supervision           General transfer comment: good safety cognition and use of hands to steady self    Ambulation/Gait Ambulation/Gait assistance: Supervision, Contact guard assist Gait Distance (Feet): 135 Feet Assistive device: Rolling walker (2 wheels) Gait Pattern/deviations: Step-to pattern, Decreased step length - right, Decreased step length - left, Shuffle, Trunk flexed Gait velocity: decreased     General Gait Details:  tolerated an increased distance with <25% VC's on safety with turns and upright posture.   Stairs Stairs: Yes   Stair Management: No rails, Step to pattern, Forwards, With walker Number of Stairs: 2 General stair comments: up 2 steps NO rails forward with walker 50% VC's on proper tech and safety.  Repeated twice.   Wheelchair Mobility     Tilt Bed    Modified Rankin (Stroke Patients Only)       Balance                                            Cognition Arousal: Alert Behavior During Therapy: WFL for tasks assessed/performed Overall Cognitive Status: Within Functional Limits for tasks assessed                                 General Comments: AxO X 3 pleasant and eager to D/C        Exercises      General Comments        Pertinent Vitals/Pain Pain Assessment Pain Assessment: 0-10 Pain Score: 3  Pain Location: R hip Pain Descriptors / Indicators: Aching, Sore Pain Intervention(s): Monitored during session, Premedicated before session, Repositioned, Ice applied    Home Living                          Prior Function  PT Goals (current goals can now be found in the care plan section) Progress towards PT goals: Progressing toward goals    Frequency    7X/week      PT Plan      Co-evaluation              AM-PAC PT "6 Clicks" Mobility   Outcome Measure  Help needed turning from your back to your side while in a flat bed without using bedrails?: None Help needed moving from lying on your back to sitting on the side of a flat bed without using bedrails?: None Help needed moving to and from a bed to a chair (including a wheelchair)?: None Help needed standing up from a chair using your arms (e.g., wheelchair or bedside chair)?: None Help needed to walk in hospital room?: None Help needed climbing 3-5 steps with a railing? : A Little 6 Click Score: 23    End of Session Equipment  Utilized During Treatment: Gait belt Activity Tolerance: Patient tolerated treatment well Patient left: in chair;with call bell/phone within reach;with chair alarm set;with family/visitor present Nurse Communication: Mobility status PT Visit Diagnosis: Difficulty in walking, not elsewhere classified (R26.2)     Time: 9604-5409 PT Time Calculation (min) (ACUTE ONLY): 31 min  Charges:    $Gait Training: 8-22 mins $Therapeutic Activity: 8-22 mins PT General Charges $$ ACUTE PT VISIT: 1 Visit                     Felecia Shelling  PTA Acute  Rehabilitation Services Office M-F          [phone]

## 2023-02-22 DIAGNOSIS — Z96641 Presence of right artificial hip joint: Secondary | ICD-10-CM | POA: Diagnosis not present

## 2023-02-22 DIAGNOSIS — Z471 Aftercare following joint replacement surgery: Secondary | ICD-10-CM | POA: Diagnosis not present

## 2023-02-25 ENCOUNTER — Other Ambulatory Visit: Payer: Self-pay | Admitting: Family Medicine

## 2023-03-22 DIAGNOSIS — Z471 Aftercare following joint replacement surgery: Secondary | ICD-10-CM | POA: Diagnosis not present

## 2023-03-22 DIAGNOSIS — Z96641 Presence of right artificial hip joint: Secondary | ICD-10-CM | POA: Diagnosis not present

## 2023-03-23 ENCOUNTER — Other Ambulatory Visit: Payer: Self-pay | Admitting: Family Medicine

## 2023-03-25 ENCOUNTER — Other Ambulatory Visit: Payer: Self-pay

## 2023-03-25 MED ORDER — VALSARTAN 80 MG PO TABS: 80.0000 mg | ORAL_TABLET | Freq: Every morning | ORAL | 2 refills | Status: DC

## 2023-03-27 ENCOUNTER — Telehealth: Payer: Self-pay | Admitting: *Deleted

## 2023-03-27 NOTE — Telephone Encounter (Signed)
 PA Repatha :  Approved today by [employer] 2017 NCPDP Request Reference Number: [id number]. REPATHA  SURE INJ 140MG /ML is approved through 03/26/2024. Your patient may now fill this prescription and it will be covered.

## 2023-04-04 ENCOUNTER — Encounter: Payer: Self-pay | Admitting: Nurse Practitioner

## 2023-04-04 ENCOUNTER — Ambulatory Visit: Payer: BC Managed Care – PPO | Admitting: Nurse Practitioner

## 2023-04-04 VITALS — BP 124/78 | HR 90 | Temp 99.1°F | Ht 69.5 in | Wt 252.0 lb

## 2023-04-04 DIAGNOSIS — F419 Anxiety disorder, unspecified: Secondary | ICD-10-CM | POA: Diagnosis not present

## 2023-04-04 DIAGNOSIS — G4700 Insomnia, unspecified: Secondary | ICD-10-CM | POA: Diagnosis not present

## 2023-04-04 DIAGNOSIS — Z23 Encounter for immunization: Secondary | ICD-10-CM | POA: Diagnosis not present

## 2023-04-04 DIAGNOSIS — S61216A Laceration without foreign body of right little finger without damage to nail, initial encounter: Secondary | ICD-10-CM

## 2023-04-04 DIAGNOSIS — S61219A Laceration without foreign body of unspecified finger without damage to nail, initial encounter: Secondary | ICD-10-CM

## 2023-04-04 DIAGNOSIS — F32A Depression, unspecified: Secondary | ICD-10-CM

## 2023-04-04 DIAGNOSIS — I10 Essential (primary) hypertension: Secondary | ICD-10-CM | POA: Diagnosis not present

## 2023-04-04 DIAGNOSIS — J069 Acute upper respiratory infection, unspecified: Secondary | ICD-10-CM

## 2023-04-04 MED ORDER — DOXYCYCLINE HYCLATE 100 MG PO CAPS: 100.0000 mg | ORAL_CAPSULE | Freq: Two times a day (BID) | ORAL | 0 refills | Status: DC

## 2023-04-04 MED ORDER — MUPIROCIN 2 % EX OINT: TOPICAL_OINTMENT | CUTANEOUS | 0 refills | Status: DC

## 2023-04-04 MED ORDER — DULOXETINE HCL 60 MG PO CPEP: 60.0000 mg | ORAL_CAPSULE | Freq: Every day | ORAL | 1 refills | Status: DC

## 2023-04-04 NOTE — Progress Notes (Signed)
 Subjective:    Patient ID: Carol Sharp, female    DOB: 1975-09-02, 48 y.o.   MRN: [id number]  HPI Patient presents for several issues.  Is here for routine follow-up.  Adherent to her medication regimen.  Denies any chest pain/tightness.  No wheezing.  States she is doing very well on her current dose of duloxetine 60 mg daily.  Was sent 30 mg dose with last refill but would like to continue the 60 mg dose.  Taking chlorthalidone and valsartan for her blood pressure.  Can run high at times outside the office, max BP 160/95-101.  States swelling is much better with fluid pill.  Takes 1 Xanax per day either a whole tablet or half tablet twice daily.  Also using trazodone for sleep which is working well. Began having congestion 3 days ago.  Possible fever.  Sweating at times.  Sore throat.  Ear pressure.  Very frequent sneezing.  Frequent cough, at times congested.  Has produced a small amount of green mucus.  Has taken OTC products including Mucinex with minimal improvement.  Smokes half pack per day. In addition patient has a laceration of the small finger of the right hand.  See addendum to this note. Of note patient states she was on aspirin up until last Friday per her orthopedic specialist but this has been discontinued.  Review of Systems  Constitutional:  Positive for fatigue and fever.  HENT:  Positive for congestion, postnasal drip, sneezing and sore throat. Negative for ear pain.   Respiratory:  Positive for cough. Negative for chest tightness, shortness of breath and wheezing.   Cardiovascular:  Negative for chest pain.   Social History   Tobacco Use   Smoking status: Every Day    Current packs/day: 0.50    Average packs/day: 0.5 packs/day for 15.0 years (7.5 ttl pk-yrs)    Types: Cigarettes   Smokeless tobacco: Never  Vaping Use   Vaping status: Never Used  Substance Use Topics   Alcohol use: Never   Drug use: No        Objective:   Physical Exam NAD.  Alert,  oriented.  Patient in obvious pain from her injury to her right finger.  Has a dressing with tape wrapped around her finger.  When this was removed a large superficial open wound was noted with continuous bleeding which would not stop except with continued pressure.  Lungs clear.  Heart regular rate rhythm. Today's Vitals   04/04/23 0912  BP: 124/78  Pulse: 90  Temp: 99.1 F (37.3 C)  SpO2: 94%  Weight: 252 lb (114.3 kg)  Height: 5' 9.5" (1.765 m)   Body mass index is 36.68 kg/m.  Note that except for 1 reading back in December at the time of her surgery all of her blood pressures have been within normal range at our office over the past year.     Assessment & Plan:   Problem List Items Addressed This Visit       Cardiovascular and Mediastinum   Benign essential hypertension - Primary     Other   Anxiety and depression   Relevant Medications   DULoxetine (CYMBALTA) 60 MG capsule   Insomnia   Other Visit Diagnoses       Laceration of finger of right hand without damage to nail, initial encounter       Relevant Orders   Td : Tetanus/diphtheria >7yo Preservative  free (Completed)     Viral URI with cough  Relevant Medications   mupirocin ointment (BACTROBAN) 2 %        Meds ordered this encounter  Medications   DULoxetine (CYMBALTA) 60 MG capsule    Sig: Take 1 capsule (60 mg total) by mouth daily.    Dispense:  90 capsule    Refill:  1    Please note dose change    Supervising Provider:   Lilyan Punt A [[id number]]   doxycycline (VIBRAMYCIN) 100 MG capsule    Sig: Take 1 capsule (100 mg total) by mouth 2 (two) times daily.    Dispense:  14 capsule    Refill:  0    Hold unless patient calls. Thanks.    Supervising Provider:   Lilyan Punt A [[id number]]   mupirocin ointment (BACTROBAN) 2 %    Sig: Apply to wound BID for 7 days    Dispense:  22 g    Refill:  0    Supervising Provider:   Lilyan Punt A [[id number]]   Continue current medications as directed. Patient  to take 2 of the 30 mg Cymbalta and start new prescription of 60 mg daily. Warning symptoms reviewed regarding URI.  Prescription sent in for doxycycline to have on hand over the weekend in case URI symptoms worsen.  Also may need this if any signs of infection at her wound.   Discussed smoking cessation. Continue weight loss efforts. See attached note regarding laceration.  Addendum-patient has a laceration to the small finger of her right hand where she was using a vegetable processor and it literally cut off the surface of the skin leaving a open wound that Bleeding.  In order to help stop the bleeding recommended 2 stitches to help close the edges.  Patient was open to having this completed.  She was given the option to go to the ER if she would prefer She opted to allow [place] to work on this.  With her permission did a nerve block of her finger with some additional lidocaine without epinephrine toward the end of the finger the nerve block was half cc of lidocaine without epinephrine on both sides of the small finger at the base 2-4 0 Ethilon sutures were placed Bleeding was stopped nonstick dressing was applied Patient was instructed use Bactroban ointment couple times a day and gently clean it couple times a day and have sutures removed within 7 days if any problems or concerns to notify us

## 2023-04-05 ENCOUNTER — Other Ambulatory Visit: Payer: Self-pay | Admitting: Family Medicine

## 2023-04-05 ENCOUNTER — Other Ambulatory Visit: Payer: Self-pay | Admitting: Nurse Practitioner

## 2023-04-11 ENCOUNTER — Ambulatory Visit (INDEPENDENT_AMBULATORY_CARE_PROVIDER_SITE_OTHER): Payer: BC Managed Care – PPO | Admitting: Family Medicine

## 2023-04-11 ENCOUNTER — Ambulatory Visit: Payer: BC Managed Care – PPO | Admitting: Family Medicine

## 2023-04-11 VITALS — BP 133/80 | HR 94 | Temp 97.2°F | Ht 69.5 in | Wt 259.0 lb

## 2023-04-11 DIAGNOSIS — Z4802 Encounter for removal of sutures: Secondary | ICD-10-CM

## 2023-04-11 NOTE — Progress Notes (Signed)
 Patient last week had 2 sutures placed on his her finger On this injury she was using a vegetable cutter and it literally removed a portion of her skin As a result there was no way to totally sew it back together but 2 sutures were placed to bring the edges closer together No sign of active infection currently Sutures removed without difficulty Bactroban cream applied Small bandage Follow-up if ongoing troubles Warning signs discussed

## 2023-04-17 ENCOUNTER — Other Ambulatory Visit: Payer: Self-pay | Admitting: Family Medicine

## 2023-04-26 ENCOUNTER — Other Ambulatory Visit: Payer: Self-pay | Admitting: Family Medicine

## 2023-05-06 ENCOUNTER — Other Ambulatory Visit: Payer: Self-pay | Admitting: Nurse Practitioner

## 2023-05-17 ENCOUNTER — Ambulatory Visit: Admitting: Orthopedic Surgery

## 2023-05-17 DIAGNOSIS — M1711 Unilateral primary osteoarthritis, right knee: Secondary | ICD-10-CM

## 2023-05-17 MED ORDER — METHYLPREDNISOLONE ACETATE 40 MG/ML IJ SUSP
40.0000 mg | Freq: Once | INTRAMUSCULAR | Status: AC
  Administered 2023-05-17: 40 mg via INTRA_ARTICULAR

## 2023-05-17 NOTE — Progress Notes (Signed)
 Patient ID: Carol Sharp, female   DOB: [birth year], 48 y.o.   MRN: [id number]  Chief Complaint  Patient presents with   Injections    Right knee     Encounter Diagnosis  Name Primary?   Primary osteoarthritis of right knee Yes    Procedure note right knee injection   verbal consent was obtained to inject right knee joint  Timeout was completed to confirm the site of injection  The medications used were depomedrol 40 mg and 1% lidocaine 3 cc Anesthesia was provided by ethyl chloride and the skin was prepped with alcohol.  After cleaning the skin with alcohol a 20-gauge needle was used to inject the right knee joint. There were no complications. A sterile bandage was applied.

## 2023-05-17 NOTE — Progress Notes (Signed)
Right knee injection

## 2023-05-24 ENCOUNTER — Other Ambulatory Visit: Payer: Self-pay | Admitting: Nurse Practitioner

## 2023-06-05 ENCOUNTER — Other Ambulatory Visit: Payer: Self-pay | Admitting: Nurse Practitioner

## 2023-07-08 ENCOUNTER — Other Ambulatory Visit: Payer: Self-pay | Admitting: Nurse Practitioner

## 2023-07-25 ENCOUNTER — Other Ambulatory Visit: Payer: Self-pay | Admitting: Nurse Practitioner

## 2023-08-02 ENCOUNTER — Other Ambulatory Visit: Payer: Self-pay | Admitting: Nurse Practitioner

## 2023-08-07 ENCOUNTER — Other Ambulatory Visit: Payer: Self-pay | Admitting: Nurse Practitioner

## 2023-08-08 ENCOUNTER — Encounter: Payer: Self-pay | Admitting: Nurse Practitioner

## 2023-09-14 ENCOUNTER — Other Ambulatory Visit: Payer: Self-pay | Admitting: Nurse Practitioner

## 2023-09-24 DIAGNOSIS — M5116 Intervertebral disc disorders with radiculopathy, lumbar region: Secondary | ICD-10-CM | POA: Diagnosis not present

## 2023-09-24 DIAGNOSIS — Z1331 Encounter for screening for depression: Secondary | ICD-10-CM | POA: Diagnosis not present

## 2023-09-25 ENCOUNTER — Other Ambulatory Visit: Payer: Self-pay | Admitting: Nurse Practitioner

## 2023-09-25 ENCOUNTER — Encounter (HOSPITAL_COMMUNITY): Payer: Self-pay | Admitting: Emergency Medicine

## 2023-09-25 ENCOUNTER — Inpatient Hospital Stay (HOSPITAL_COMMUNITY)
Admission: EM | Admit: 2023-09-25 | Discharge: 2023-09-26 | DRG: 101 | Disposition: A | Discharge status: Home / Self Care | Attending: Internal Medicine | Admitting: Internal Medicine

## 2023-09-25 ENCOUNTER — Inpatient Hospital Stay (HOSPITAL_COMMUNITY)

## 2023-09-25 ENCOUNTER — Emergency Department (HOSPITAL_COMMUNITY)

## 2023-09-25 ENCOUNTER — Other Ambulatory Visit (HOSPITAL_COMMUNITY)

## 2023-09-25 ENCOUNTER — Other Ambulatory Visit: Payer: Self-pay

## 2023-09-25 DIAGNOSIS — R404 Transient alteration of awareness: Secondary | ICD-10-CM | POA: Diagnosis not present

## 2023-09-25 DIAGNOSIS — R748 Abnormal levels of other serum enzymes: Secondary | ICD-10-CM | POA: Diagnosis present

## 2023-09-25 DIAGNOSIS — Z79899 Other long term (current) drug therapy: Secondary | ICD-10-CM | POA: Diagnosis not present

## 2023-09-25 DIAGNOSIS — Z83438 Family history of other disorder of lipoprotein metabolism and other lipidemia: Secondary | ICD-10-CM | POA: Diagnosis not present

## 2023-09-25 DIAGNOSIS — Z803 Family history of malignant neoplasm of breast: Secondary | ICD-10-CM | POA: Diagnosis not present

## 2023-09-25 DIAGNOSIS — R569 Unspecified convulsions: Secondary | ICD-10-CM | POA: Diagnosis not present

## 2023-09-25 DIAGNOSIS — M6282 Rhabdomyolysis: Secondary | ICD-10-CM | POA: Diagnosis present

## 2023-09-25 DIAGNOSIS — E66813 Obesity, class 3: Secondary | ICD-10-CM | POA: Diagnosis present

## 2023-09-25 DIAGNOSIS — R32 Unspecified urinary incontinence: Secondary | ICD-10-CM | POA: Diagnosis present

## 2023-09-25 DIAGNOSIS — F4323 Adjustment disorder with mixed anxiety and depressed mood: Secondary | ICD-10-CM | POA: Diagnosis present

## 2023-09-25 DIAGNOSIS — Z820 Family history of epilepsy and other diseases of the nervous system: Secondary | ICD-10-CM

## 2023-09-25 DIAGNOSIS — R4020 Unspecified coma: Secondary | ICD-10-CM | POA: Diagnosis not present

## 2023-09-25 DIAGNOSIS — Z8349 Family history of other endocrine, nutritional and metabolic diseases: Secondary | ICD-10-CM

## 2023-09-25 DIAGNOSIS — Z8249 Family history of ischemic heart disease and other diseases of the circulatory system: Secondary | ICD-10-CM | POA: Diagnosis not present

## 2023-09-25 DIAGNOSIS — Z96641 Presence of right artificial hip joint: Secondary | ICD-10-CM | POA: Diagnosis not present

## 2023-09-25 DIAGNOSIS — Z800 Family history of malignant neoplasm of digestive organs: Secondary | ICD-10-CM | POA: Diagnosis not present

## 2023-09-25 DIAGNOSIS — M25551 Pain in right hip: Secondary | ICD-10-CM | POA: Diagnosis not present

## 2023-09-25 DIAGNOSIS — E876 Hypokalemia: Secondary | ICD-10-CM | POA: Diagnosis not present

## 2023-09-25 DIAGNOSIS — Z833 Family history of diabetes mellitus: Secondary | ICD-10-CM

## 2023-09-25 DIAGNOSIS — G40909 Epilepsy, unspecified, not intractable, without status epilepticus: Secondary | ICD-10-CM | POA: Diagnosis not present

## 2023-09-25 DIAGNOSIS — I10 Essential (primary) hypertension: Secondary | ICD-10-CM | POA: Diagnosis present

## 2023-09-25 DIAGNOSIS — Z882 Allergy status to sulfonamides status: Secondary | ICD-10-CM

## 2023-09-25 DIAGNOSIS — E871 Hypo-osmolality and hyponatremia: Secondary | ICD-10-CM | POA: Diagnosis not present

## 2023-09-25 DIAGNOSIS — R4182 Altered mental status, unspecified: Secondary | ICD-10-CM | POA: Diagnosis not present

## 2023-09-25 DIAGNOSIS — G629 Polyneuropathy, unspecified: Secondary | ICD-10-CM | POA: Diagnosis present

## 2023-09-25 DIAGNOSIS — Z888 Allergy status to other drugs, medicaments and biological substances status: Secondary | ICD-10-CM | POA: Diagnosis not present

## 2023-09-25 DIAGNOSIS — R519 Headache, unspecified: Secondary | ICD-10-CM | POA: Diagnosis present

## 2023-09-25 DIAGNOSIS — Z90710 Acquired absence of both cervix and uterus: Secondary | ICD-10-CM | POA: Diagnosis not present

## 2023-09-25 DIAGNOSIS — R531 Weakness: Secondary | ICD-10-CM | POA: Diagnosis not present

## 2023-09-25 DIAGNOSIS — R58 Hemorrhage, not elsewhere classified: Secondary | ICD-10-CM | POA: Diagnosis not present

## 2023-09-25 DIAGNOSIS — F32A Depression, unspecified: Secondary | ICD-10-CM | POA: Diagnosis not present

## 2023-09-25 DIAGNOSIS — R110 Nausea: Secondary | ICD-10-CM | POA: Diagnosis present

## 2023-09-25 DIAGNOSIS — G4700 Insomnia, unspecified: Secondary | ICD-10-CM | POA: Diagnosis not present

## 2023-09-25 DIAGNOSIS — Z6837 Body mass index (BMI) 37.0-37.9, adult: Secondary | ICD-10-CM

## 2023-09-25 DIAGNOSIS — F1290 Cannabis use, unspecified, uncomplicated: Secondary | ICD-10-CM | POA: Diagnosis present

## 2023-09-25 DIAGNOSIS — I6523 Occlusion and stenosis of bilateral carotid arteries: Secondary | ICD-10-CM | POA: Diagnosis not present

## 2023-09-25 DIAGNOSIS — F419 Anxiety disorder, unspecified: Secondary | ICD-10-CM | POA: Diagnosis not present

## 2023-09-25 DIAGNOSIS — Z743 Need for continuous supervision: Secondary | ICD-10-CM | POA: Diagnosis not present

## 2023-09-25 DIAGNOSIS — F17210 Nicotine dependence, cigarettes, uncomplicated: Secondary | ICD-10-CM | POA: Diagnosis not present

## 2023-09-25 LAB — HCG, SERUM, QUALITATIVE: Preg, Serum: NEGATIVE

## 2023-09-25 LAB — RAPID URINE DRUG SCREEN, HOSP PERFORMED
Amphetamines: NOT DETECTED
Barbiturates: NOT DETECTED
Benzodiazepines: NOT DETECTED
Cocaine: NOT DETECTED
Opiates: NOT DETECTED
Tetrahydrocannabinol: POSITIVE — AB

## 2023-09-25 LAB — COMPREHENSIVE METABOLIC PANEL WITH GFR
ALT: 103 U/L — ABNORMAL HIGH (ref 0–44)
AST: 81 U/L — ABNORMAL HIGH (ref 15–41)
Albumin: 3.8 g/dL (ref 3.5–5.0)
Alkaline Phosphatase: 70 U/L (ref 38–126)
Anion gap: 11 (ref 5–15)
BUN: 15 mg/dL (ref 6–20)
CO2: 22 mmol/L (ref 22–32)
Calcium: 8.9 mg/dL (ref 8.9–10.3)
Chloride: 98 mmol/L (ref 98–111)
Creatinine, Ser: 0.92 mg/dL (ref 0.44–1.00)
GFR, Estimated: >60 mL/min (ref >60)
Glucose, Bld: 105 mg/dL — ABNORMAL HIGH (ref 70–99)
Potassium: 3.2 mmol/L — ABNORMAL LOW (ref 3.5–5.1)
Sodium: 131 mmol/L — ABNORMAL LOW (ref 135–145)
Total Bilirubin: 0.7 mg/dL (ref 0.0–1.2)
Total Protein: 6.6 g/dL (ref 6.5–8.1)

## 2023-09-25 LAB — CBC WITH DIFFERENTIAL/PLATELET
Abs Immature Granulocytes: 0.03 K/uL (ref 0.00–0.07)
Basophils Absolute: 0.1 K/uL (ref 0.0–0.1)
Basophils Relative: 1 %
Eosinophils Absolute: 0.2 K/uL (ref 0.0–0.5)
Eosinophils Relative: 2 %
HCT: 43.1 % (ref 36.0–46.0)
Hemoglobin: 15.4 g/dL — ABNORMAL HIGH (ref 12.0–15.0)
Immature Granulocytes: 0 %
Lymphocytes Relative: 17 %
Lymphs Abs: 1.5 K/uL (ref 0.7–4.0)
MCH: 32.7 pg (ref 26.0–34.0)
MCHC: 35.7 g/dL (ref 30.0–36.0)
MCV: 91.5 fL (ref 80.0–100.0)
Monocytes Absolute: 0.4 K/uL (ref 0.1–1.0)
Monocytes Relative: 5 %
Neutro Abs: 6.3 K/uL (ref 1.7–7.7)
Neutrophils Relative %: 75 %
Platelets: 349 K/uL (ref 150–400)
RBC: 4.71 MIL/uL (ref 3.87–5.11)
RDW: 12.1 % (ref 11.5–15.5)
WBC: 8.4 K/uL (ref 4.0–10.5)
nRBC: 0 % (ref 0.0–0.2)

## 2023-09-25 LAB — URINALYSIS, ROUTINE W REFLEX MICROSCOPIC
Bilirubin Urine: NEGATIVE
Glucose, UA: NEGATIVE mg/dL
Hgb urine dipstick: NEGATIVE
Ketones, ur: NEGATIVE mg/dL
Leukocytes,Ua: NEGATIVE
Nitrite: NEGATIVE
Protein, ur: NEGATIVE mg/dL
Specific Gravity, Urine: 1.012 (ref 1.005–1.030)
pH: 7 (ref 5.0–8.0)

## 2023-09-25 LAB — CBG MONITORING, ED: Glucose-Capillary: 103 mg/dL — ABNORMAL HIGH (ref 70–99)

## 2023-09-25 LAB — MAGNESIUM: Magnesium: 2.2 mg/dL (ref 1.7–2.4)

## 2023-09-25 LAB — CK: Total CK: 719 U/L — ABNORMAL HIGH (ref 38–234)

## 2023-09-25 LAB — ETHANOL: Alcohol, Ethyl (B): <15 mg/dL (ref <15)

## 2023-09-25 MED ORDER — OXYCODONE HCL 5 MG PO TABS
5.0000 mg | ORAL_TABLET | Freq: Four times a day (QID) | ORAL | Status: DC | PRN
  Administered 2023-09-25 – 2023-09-26 (×2): 5 mg via ORAL
  Filled 2023-09-25 (×2): qty 1

## 2023-09-25 MED ORDER — GADOBUTROL 1 MMOL/ML IV SOLN
10.0000 mL | Freq: Once | INTRAVENOUS | Status: AC | PRN
  Administered 2023-09-25: 10 mL via INTRAVENOUS

## 2023-09-25 MED ORDER — SODIUM CHLORIDE 0.9% FLUSH: 3.0000 mL | INTRAVENOUS | Status: DC | PRN

## 2023-09-25 MED ORDER — POTASSIUM CHLORIDE CRYS ER 20 MEQ PO TBCR
40.0000 meq | EXTENDED_RELEASE_TABLET | ORAL | Status: AC
  Administered 2023-09-25 (×2): 40 meq via ORAL
  Filled 2023-09-25 (×2): qty 2

## 2023-09-25 MED ORDER — GABAPENTIN 300 MG PO CAPS
900.0000 mg | ORAL_CAPSULE | Freq: Three times a day (TID) | ORAL | Status: DC
Start: 2023-09-25 — End: 2023-09-26
  Administered 2023-09-25 – 2023-09-26 (×2): 900 mg via ORAL
  Filled 2023-09-25 (×2): qty 3

## 2023-09-25 MED ORDER — HEPARIN SODIUM (PORCINE) 5000 UNIT/ML IJ SOLN
5000.0000 [IU] | Freq: Three times a day (TID) | INTRAMUSCULAR | Status: DC
  Administered 2023-09-25 – 2023-09-26 (×2): 5000 [IU] via SUBCUTANEOUS
  Filled 2023-09-25 (×2): qty 1

## 2023-09-25 MED ORDER — TRAZODONE HCL 50 MG PO TABS
50.0000 mg | ORAL_TABLET | Freq: Every evening | ORAL | Status: DC | PRN
  Administered 2023-09-25: 50 mg via ORAL
  Filled 2023-09-25: qty 1

## 2023-09-25 MED ORDER — ACETAMINOPHEN 650 MG RE SUPP
650.0000 mg | Freq: Four times a day (QID) | RECTAL | Status: DC | PRN
Start: 2023-09-25 — End: 2023-09-26

## 2023-09-25 MED ORDER — LEVETIRACETAM 250 MG PO TABS
500.0000 mg | ORAL_TABLET | Freq: Two times a day (BID) | ORAL | Status: DC
  Administered 2023-09-25 – 2023-09-26 (×2): 500 mg via ORAL
  Filled 2023-09-25 (×2): qty 2

## 2023-09-25 MED ORDER — HYDRALAZINE HCL 20 MG/ML IJ SOLN: 10.0000 mg | Freq: Four times a day (QID) | INTRAMUSCULAR | Status: DC | PRN

## 2023-09-25 MED ORDER — SODIUM CHLORIDE 0.9 % IV SOLN: INTRAVENOUS | Status: DC | PRN

## 2023-09-25 MED ORDER — LORAZEPAM 2 MG/ML IJ SOLN
1.0000 mg | Freq: Four times a day (QID) | INTRAMUSCULAR | Status: DC | PRN
  Administered 2023-09-25: 1 mg via INTRAVENOUS
  Filled 2023-09-25: qty 1

## 2023-09-25 MED ORDER — DULOXETINE HCL 60 MG PO CPEP
60.0000 mg | ORAL_CAPSULE | Freq: Every day | ORAL | Status: DC
  Administered 2023-09-25 – 2023-09-26 (×2): 60 mg via ORAL
  Filled 2023-09-25 (×2): qty 1

## 2023-09-25 MED ORDER — POTASSIUM CHLORIDE CRYS ER 20 MEQ PO TBCR
40.0000 meq | EXTENDED_RELEASE_TABLET | Freq: Once | ORAL | Status: AC
  Administered 2023-09-25: 40 meq via ORAL
  Filled 2023-09-25: qty 2

## 2023-09-25 MED ORDER — CYCLOBENZAPRINE HCL 10 MG PO TABS
10.0000 mg | ORAL_TABLET | Freq: Once | ORAL | Status: AC
  Administered 2023-09-25: 10 mg via ORAL
  Filled 2023-09-25: qty 1

## 2023-09-25 MED ORDER — SODIUM CHLORIDE 0.9% FLUSH
3.0000 mL | Freq: Two times a day (BID) | INTRAVENOUS | Status: DC
  Administered 2023-09-26: 3 mL via INTRAVENOUS

## 2023-09-25 MED ORDER — ONDANSETRON HCL 4 MG PO TABS: 4.0000 mg | ORAL_TABLET | Freq: Four times a day (QID) | ORAL | Status: DC | PRN

## 2023-09-25 MED ORDER — POLYETHYLENE GLYCOL 3350 17 G PO PACK: 17.0000 g | PACK | Freq: Every day | ORAL | Status: DC | PRN

## 2023-09-25 MED ORDER — KETOROLAC TROMETHAMINE 15 MG/ML IJ SOLN
15.0000 mg | Freq: Once | INTRAMUSCULAR | Status: AC
  Administered 2023-09-25: 15 mg via INTRAVENOUS
  Filled 2023-09-25: qty 1

## 2023-09-25 MED ORDER — KETOROLAC TROMETHAMINE 30 MG/ML IJ SOLN
30.0000 mg | Freq: Once | INTRAMUSCULAR | Status: AC
  Administered 2023-09-25: 30 mg via INTRAVENOUS
  Filled 2023-09-25: qty 1

## 2023-09-25 MED ORDER — ACETAMINOPHEN 325 MG PO TABS: 650.0000 mg | ORAL_TABLET | Freq: Four times a day (QID) | ORAL | Status: DC | PRN

## 2023-09-25 MED ORDER — SODIUM CHLORIDE 0.9 % IV SOLN: INTRAVENOUS | Status: AC

## 2023-09-25 MED ORDER — BISACODYL 10 MG RE SUPP: 10.0000 mg | Freq: Every day | RECTAL | Status: DC | PRN

## 2023-09-25 MED ORDER — ONDANSETRON HCL 4 MG/2ML IJ SOLN: 4.0000 mg | Freq: Four times a day (QID) | INTRAMUSCULAR | Status: DC | PRN

## 2023-09-25 NOTE — ED Notes (Signed)
[facility] has been called for transport.  

## 2023-09-25 NOTE — ED Notes (Signed)
 Transition of Care [facility]) - Inpatient Brief Assessment   Patient Details  Name: Carol Sharp MRN: [id number] Date of Birth: 1976-01-25  Transition of Care [facility]) CM/SW Contact:    Noreen KATHEE Cleotilde ISRAEL Phone Number: 09/25/2023, 11:44 AM   Clinical Narrative:   Transition of Care Department [facility]) has reviewed patient and no TOC needs have been identified at this time. We will continue to monitor patient advancement through interdisciplinary progression rounds. If new patient transition needs arise, please place a TOC consult.  Transition of Care Asessment: Insurance and Status: Insurance coverage has been reviewed Patient has primary care physician: Yes Home environment has been reviewed: Single Family Home with spouse Prior level of function:: Independent Prior/Current Home Services: No current home services Social Drivers of Health Review: SDOH reviewed interventions complete (smoking reosurces added to AVS) Readmission risk has been reviewed: Yes Transition of care needs: no transition of care needs at this time

## 2023-09-25 NOTE — Plan of Care (Signed)
  Problem: Education: Goal: Knowledge of General Education information will improve Description: Including pain rating scale, medication(s)/side effects and non-pharmacologic comfort measures 09/25/2023 1900 by Maralyn Siskin, RN Outcome: Progressing 09/25/2023 1900 by Maralyn Siskin, RN Outcome: Progressing   Problem: Clinical Measurements: Goal: Diagnostic test results will improve Outcome: Progressing   Problem: Activity: Goal: Risk for activity intolerance will decrease Outcome: Progressing

## 2023-09-25 NOTE — Progress Notes (Signed)
 Spoke with RN to coordinate a good time for the EEG. Pt has bed request for [facility]. Will perform EEG at [facility]

## 2023-09-25 NOTE — Consult Note (Signed)
 NEUROLOGY CONSULT NOTE   Date of service: September 25, 2023 Patient Name: Carol Sharp MRN:  [id number] DOB:  1975/10/31 Chief Complaint: Seizures Requesting Provider: Pearlean Manus, MD  History of Present Illness  Carol Sharp is a 48 y.o. female with hx of hypertension is presenting with seizure.  This morning, she was seen to be having convulsive activity by her husband at home.  The first thing that she remembers is paramedics that she knows arriving to respond to a 911 call that her husband made.  She was transported to [facility] where she was evaluated with MRI which is essentially negative.  She was transferred to [facility] for further management.  She also had an episode about a month ago where she awoke with a lateral tongue bite, and has at least one other episode that she was concerned about could have been related to her having a seizure in the night.  She has never had one during the day.    She does have a family history of epilepsy, with her mother being managed for seizures since age 53 with phenobarbital.  She denies any history of TBI, and the only perinatal event of concern was that she was somnolent due to the phenobarbital following birth.  She has noticed that her memory does not seem to be as good as it used to be.    Past History   Past Medical History:  Diagnosis Date   Anxiety    Arthritis    Breast nodule 08/02/2015   Depression    Hypertension    MRSA cellulitis 06/2003   PONV (postoperative nausea and vomiting)     Past Surgical History:  Procedure Laterality Date   ABDOMINAL HYSTERECTOMY N/A 01/17/2022   Procedure: HYSTERECTOMY ABDOMINAL; TOTAL WITH BILATERAL SALPINGECTOMY;  Surgeon: Marilynn Nest, DO;  Location: [facility];  Service: Gynecology;  Laterality: N/A;   ABLATION  06/19/2008   CESAREAN SECTION     CHOLECYSTECTOMY     COLONOSCOPY WITH PROPOFOL  N/A 02/16/2021   Procedure: COLONOSCOPY WITH PROPOFOL ;  Surgeon: Cindie Carlin POUR, DO;   Location: [facility] SUITE;  Service: Endoscopy;  Laterality: N/A;  1:00pm   DILATION AND CURETTAGE OF UTERUS  11/20/1998   INCISIONAL HERNIA REPAIR N/A 03/24/2021   Procedure: LAPAROSCOPIC INCISIONAL HERNIA W/MESH;  Surgeon: Mavis Anes, MD;  Location: [facility];  Service: General;  Laterality: N/A;   KNEE ARTHROSCOPY WITH MEDIAL MENISECTOMY Right 04/18/2017   Procedure: RIGHT KNEE ARTHROSCOPY WITH PARTIAL  MEDIAL MENISECTOMY;  Surgeon: Margrette Taft BRAVO, MD;  Location: [facility];  Service: Orthopedics;  Laterality: Right;   TONSILLECTOMY  1981?   TOTAL HIP ARTHROPLASTY Right 02/07/2023   Procedure: TOTAL HIP ARTHROPLASTY ANTERIOR APPROACH;  Surgeon: Fidel Rogue, MD;  Location: [facility];  Service: Orthopedics;  Laterality: Right;   TUBAL LIGATION      Family History: Family History  Problem Relation Age of Onset   Cancer Paternal Grandmother        breast   Cancer Maternal Grandmother        colon   Hyperlipidemia Father    Hypertension Mother    Diabetes Mother    Hyperlipidemia Mother    Thyroid disease Mother    Seizures Mother    Cancer Paternal Aunt        breast   Cancer Paternal Aunt        breast   Cancer Paternal Aunt        breast    Social  History  reports that she has been smoking cigarettes. She has a 7.5 pack-year smoking history. She has never used smokeless tobacco. She reports that she does not drink alcohol  and does not use drugs.  Allergies  Allergen Reactions   Furosemide Nausea And Vomiting   Lipitor [Atorvastatin ] Other (See Comments)    Joint pains-  severe   Amlodipine  Swelling   Sulfa Antibiotics Hives    Medications   Current Facility-Administered Medications:    0.9 %  sodium chloride  infusion, , Intravenous, PRN, Emokpae, Courage, MD   0.9 %  sodium chloride  infusion, , Intravenous, Continuous, Emokpae, Courage, MD, Last Rate: 100 mL/hr at 09/25/23 1817, New Bag at 09/25/23 1817   acetaminophen  (TYLENOL ) tablet 650 mg, 650 mg, Oral, Q6H PRN  **OR** acetaminophen  (TYLENOL ) suppository 650 mg, 650 mg, Rectal, Q6H PRN, Emokpae, Courage, MD   bisacodyl  (DULCOLAX) suppository 10 mg, 10 mg, Rectal, Daily PRN, Emokpae, Courage, MD   DULoxetine  (CYMBALTA ) DR capsule 60 mg, 60 mg, Oral, Daily, Emokpae, Courage, MD, 60 mg at 09/25/23 1737   gabapentin  (NEURONTIN ) capsule 900 mg, 900 mg, Oral, TID WC, Emokpae, Courage, MD, 900 mg at 09/25/23 1736   heparin  injection 5,000 Units, 5,000 Units, Subcutaneous, Q8H, Emokpae, Courage, MD, 5,000 Units at 09/25/23 2113   hydrALAZINE  (APRESOLINE ) injection 10 mg, 10 mg, Intravenous, Q6H PRN, Emokpae, Courage, MD   LORazepam  (ATIVAN ) injection 1 mg, 1 mg, Intravenous, Q6H PRN, Emokpae, Courage, MD, 1 mg at 09/25/23 1142   ondansetron  (ZOFRAN ) tablet 4 mg, 4 mg, Oral, Q6H PRN **OR** ondansetron  (ZOFRAN ) injection 4 mg, 4 mg, Intravenous, Q6H PRN, Emokpae, Courage, MD   oxyCODONE  (Oxy IR/ROXICODONE ) immediate release tablet 5 mg, 5 mg, Oral, Q6H PRN, Emokpae, Courage, MD, 5 mg at 09/25/23 1737   polyethylene glycol (MIRALAX  / GLYCOLAX ) packet 17 g, 17 g, Oral, Daily PRN, Emokpae, Courage, MD   sodium chloride  flush (NS) 0.9 % injection 3 mL, 3 mL, Intravenous, Q12H, Emokpae, Courage, MD   sodium chloride  flush (NS) 0.9 % injection 3 mL, 3 mL, Intravenous, Q12H, Emokpae, Courage, MD   sodium chloride  flush (NS) 0.9 % injection 3 mL, 3 mL, Intravenous, PRN, Emokpae, Courage, MD   traZODone  (DESYREL ) tablet 50 mg, 50 mg, Oral, QHS PRN, Emokpae, Courage, MD, 50 mg at 09/25/23 2309  Vitals   Vitals:   09/25/23 1400 09/25/23 1542 09/25/23 1700 09/25/23 2120  BP: (!) 134/97   135/80  Pulse: 76   75  Resp: 14  16 16   Temp:  97.7 F (36.5 C)  98.3 F (36.8 C)  TempSrc:  Oral Oral Oral  SpO2: 95%   95%  Weight:      Height:        Body mass index is 34.44 kg/m.   Physical Exam   Constitutional: Appears well-developed and well-nourished.   Neurologic Examination    Neuro: Mental Status: Patient  is awake, alert, interactive and appropriate Patient is able to give a clear and coherent history. No signs of aphasia or neglect Cranial Nerves: II: Visual Fields are full. Pupils are equal, round, and reactive to light.   III,IV, VI: EOMI without ptosis or diploplia.  V: Facial sensation is symmetric to temperature VII: Facial movement is symmetric.  VIII: hearing is intact to voice X: Uvula elevates symmetrically XII: tongue is midline without atrophy or fasciculations.  Motor: Tone is normal. Bulk is normal. 5/5 strength was present in all four extremities.  Sensory: Sensation is symmetric to light touch and  temperature in the arms and legs. Deep Tendon Reflexes: 2+ and symmetric in the biceps and patellae.  Plantars: Toes are downgoing bilaterally.  Cerebellar: FNF and HKS are intact bilaterally        Labs/Imaging/Neurodiagnostic studies   CBC:  Recent Labs  Lab 2023-10-07 0822  WBC 8.4  NEUTROABS 6.3  HGB 15.4*  HCT 43.1  MCV 91.5  PLT 349   Basic Metabolic Panel:  Lab Results  Component Value Date   NA 131 (L) Oct 07, 2023   K 3.2 (L) Oct 07, 2023   CO2 22 10/07/23   GLUCOSE 105 (H) 10/07/2023   BUN 15 10/07/23   CREATININE 0.92 10-07-2023   CALCIUM  8.9 10/07/2023   GFRNONAA >60 October 07, 2023   GFRAA 96 05/19/2019   Lipid Panel:  Lab Results  Component Value Date   LDLCALC 85 07/02/2022   HgbA1c:  Lab Results  Component Value Date   HGBA1C 5.3 06/18/2015   Urine Drug Screen:     Component Value Date/Time   LABOPIA NONE DETECTED 10-07-2023 1132   COCAINSCRNUR NONE DETECTED 2023-10-07 1132   LABBENZ NONE DETECTED Oct 07, 2023 1132   AMPHETMU NONE DETECTED October 07, 2023 1132   THCU POSITIVE (A) 2023-10-07 1132   LABBARB NONE DETECTED 2023/10/07 1132    Alcohol  Level     Component Value Date/Time   [facility] <15 10-07-23 9177    MRI Brain(Personally reviewed): Negative  ASSESSMENT   NASREEN GOEDECKE is a 48 y.o. female with a witnessed seizure  that sounds partial based on description.  This coupled with a previous episode of tongue biting highly concerning for previous nocturnal seizure, I think would favor starting antiepileptic therapy at the current time.  There is no clear etiology on MRI or labs.  She is already taking 900 mg 3 times daily of gabapentin , and I would not favor increasing this but will add Keppra .  RECOMMENDATIONS  Keppra  500 mg twice daily Continue home gabapentin  EEG Follow-up with outpatient neurology Patient is unable to drive, operate heavy machinery, perform activities at heights or participate in water  activities until release by outpatient physician. This was discussed with the patient who expressed understanding.   ______________________________________________________________________    Signed, Aisha Seals, MD [facility]

## 2023-09-25 NOTE — ED Notes (Signed)
 Family called RN into room when pt became dizzy, nauseous, sweaty and labored in breathing. RN came into room and visualized all symptoms described. At that time family stated they had forgotten a similar incident 2 days previously with similar symptoms that resulted in pt sliding out of a chair and into the floor. RN and family were able to calm pt during episode described above. Pt has returned to previous baseline. MD has been notified.

## 2023-09-25 NOTE — H&P (Signed)
 History and Physical   Patient: Carol Sharp FMW:[id number] DOB: May 25, 1975 DOA: 09/25/2023 DOS: the patient was seen and examined on 09/25/2023 PCP: Alphonsa Glendia LABOR, MD  Patient coming from: Home  Chief Complaint:  Chief Complaint  Patient presents with   Seizures   HPI: Carol Sharp is a 48 y.o. female with medical history significant for depression anxiety disorder, HTN, ongoing marijuana use, tobacco abuse and obesity who presents to the ED with concerns about recurrent seizure episodes. - Around 5:30 AM this morning patient's husband noticed that patient right side especially right lower extremity was moving uncontrollably--- while in bed-patient proceeded to be incontinent of urine, she bit her tongue on the left side with some bleeding, she became very confused after this episode. - About 4 weeks ago patient had similar episode where she also bit her tongue--no prior history of seizures - Apparently patient's mother had a history of grand mal seizures - Patient denies excessive alcohol  intake she probably drinks alcohol  5-6 times a year and small amounts--no recent EtOH intake - She does smoke marijuana on a daily basis she has done this for over 20 years--  Additional history obtained from patient's husband and patient's sister at bedside  No fever  Or chills  - No neck pain no visual disturbance  - Reports headaches and nausea but no vomiting -No chest pains or palpitations - While in the ED patient per RN had an episode where Family called RN into room when pt became dizzy, nauseous, sweaty and labored in breathing. RN came into room and visualized all symptoms described. At that time family stated they had forgotten a similar incident 2 days previously with similar symptoms that resulted in pt sliding out of a chair and into the floor In the ED UDS with THC otherwise negative UA not suggestive of UTI -Total CK elevated at 719 in the setting of presumed seizures -Serum  prolactin pending - BAL negative -Magnesium 2.2 CBC WNL - Sodium is low at 131 potassium 3.2 chloride 98 bicarb 22 creatinine 0.92 AST 81 ALT 103 T. bili 0.7 MRI brain with and without contrast shows-IMPRESSION: 1. No acute or reversible finding. No cause of first-time seizure is identified. 2. 3 mm focus of T2 and FLAIR signal within the subcortical white matter of the left posterior frontal lobe. As an isolated finding, this would not likely be significant. This would be unlikely to be a cause of seizure. 3. Arachnoid herniation into the sella. This can be a normal variant or can be associated with intracranial hypertension. -- Head without acute finding - Rt hip x-rays without acute findings--prior right hip replacement noted  Additional history obtained from patient's husband and patient's sister at bedside  Review of Systems: As mentioned in the history of present illness. All other systems reviewed and are negative. Past Medical History:  Diagnosis Date   Anxiety    Arthritis    Breast nodule 08/02/2015   Depression    Hypertension    MRSA cellulitis 06/2003   PONV (postoperative nausea and vomiting)    Past Surgical History:  Procedure Laterality Date   ABDOMINAL HYSTERECTOMY N/A 01/17/2022   Procedure: HYSTERECTOMY ABDOMINAL; TOTAL WITH BILATERAL SALPINGECTOMY;  Surgeon: Marilynn Nest, DO;  Location: [facility];  Service: Gynecology;  Laterality: N/A;   ABLATION  06/19/2008   CESAREAN SECTION     CHOLECYSTECTOMY     COLONOSCOPY WITH PROPOFOL  N/A 02/16/2021   Procedure: COLONOSCOPY WITH PROPOFOL ;  Surgeon: Cindie Carlin POUR, DO;  Location: [facility];  Service: Endoscopy;  Laterality: N/A;  1:00pm   DILATION AND CURETTAGE OF UTERUS  11/20/1998   INCISIONAL HERNIA REPAIR N/A 03/24/2021   Procedure: LAPAROSCOPIC INCISIONAL HERNIA W/MESH;  Surgeon: Mavis Anes, MD;  Location: [facility];  Service: General;  Laterality: N/A;   KNEE ARTHROSCOPY WITH MEDIAL MENISECTOMY Right  04/18/2017   Procedure: RIGHT KNEE ARTHROSCOPY WITH PARTIAL  MEDIAL MENISECTOMY;  Surgeon: Margrette Taft BRAVO, MD;  Location: [facility];  Service: Orthopedics;  Laterality: Right;   TONSILLECTOMY  1981?   TOTAL HIP ARTHROPLASTY Right 02/07/2023   Procedure: TOTAL HIP ARTHROPLASTY ANTERIOR APPROACH;  Surgeon: Fidel Rogue, MD;  Location: [facility];  Service: Orthopedics;  Laterality: Right;   TUBAL LIGATION     Social History:  reports that she has been smoking cigarettes. She has a 7.5 pack-year smoking history. She has never used smokeless tobacco. She reports that she does not drink alcohol  and does not use drugs.  Allergies  Allergen Reactions   Furosemide Nausea And Vomiting   Lipitor [Atorvastatin ] Other (See Comments)    Joint pains-  severe   Amlodipine  Swelling   Sulfa Antibiotics Hives    Family History  Problem Relation Age of Onset   Cancer Paternal Grandmother        breast   Cancer Maternal Grandmother        colon   Hyperlipidemia Father    Hypertension Mother    Diabetes Mother    Hyperlipidemia Mother    Thyroid disease Mother    Seizures Mother    Cancer Paternal Aunt        breast   Cancer Paternal Aunt        breast   Cancer Paternal Aunt        breast    Prior to Admission medications   Medication Sig Start Date End Date Taking? Authorizing Provider  DULoxetine  (CYMBALTA ) 60 MG capsule TAKE ONE CAPSULE BY MOUTH DAILY 07/25/23  Yes Hoskins, Carolyn C, NP  gabapentin  (NEURONTIN ) 300 MG capsule Take 900 mg by mouth 3 (three) times daily with meals.   Yes [provider]    Physical Exam: Vitals:   09/25/23 1330 09/25/23 1345 09/25/23 1400 09/25/23 1542  BP: 133/87 (!) 117/100 (!) 134/97   Pulse: 96 80 76   Resp: 17 15 14    Temp:    97.7 F (36.5 C)  TempSrc:    Oral  SpO2: 96% 96% 95%   Weight:      Height:        Physical Exam  Gen:- Awake Alert, in no acute distress  HEENT:- [place].AT, No sclera icterus, fresh hemostatic (dried blood)  cut to left side of tongue anteriorly, older looking healing cuts without bleeding to left side of the tongue more posteriorly Neck-Supple Neck,No JVD,.  Lungs-  CTAB , fair air movement bilaterally  CV- S1, S2 normal, RRR Abd-  +ve B.Sounds, Abd Soft, No tenderness, increased truncal adiposity Extremity/Skin:- No  edema,   good pedal pulses  Psych-affect is appropriate, oriented x3 Neuro-no new focal deficits, no tremors  Data Reviewed: In the ED UDS with THC otherwise negative UA not suggestive of UTI -Total CK elevated at 719 in the setting of presumed seizures -Serum prolactin pending - BAL negative -Magnesium 2.2 CBC WNL - Sodium is low at 131 potassium 3.2 chloride 98 bicarb 22 creatinine 0.92 AST 81 ALT 103 T. bili 0.7 MRI brain with and without contrast shows-IMPRESSION: 1. No acute or  reversible finding. No cause of first-time seizure is identified. 2. 3 mm focus of T2 and FLAIR signal within the subcortical white matter of the left posterior frontal lobe. As an isolated finding, this would not likely be significant. This would be unlikely to be a cause of seizure. 3. Arachnoid herniation into the sella. This can be a normal variant or can be associated with intracranial hypertension. -- Head without acute finding - Rt hip x-rays without acute findings--prior right hip replacement noted  Assessment and Plan: 1)New Onset Seizures--- first reported episode about 4 weeks ago with tongue biting and postictal state, second episode about 2 days ago and 3rd episode  today 09/25/2023 with tongue biting, urinary incontinence and postictal state -EDP discussed case with neurologist recommended holding off on Keppra  for now -Okay to use lorazepam  as needed seizures -EEG requested - CT head and MRI brain with and without contrast as noted above--without acute findings  --Total CK elevated at 719 in the setting of presumed seizures -Serum prolactin pending - BAL negative -- Official  neurology consult pending  2)Hyponatremia/Hypokalemia--due to chlorthalidone  use - Stop chlorthalidone  - Replace potassium and hydrate  3)Marijuana Use---THC may Not be contributory to seizures. Most clinical and epidemiological data suggest that cannabis, particularly cannabidiol (CBD), may have anticonvulsant properties and is being investigated for the treatment of refractory epilepsy. However, ?9-tetrahydrocannabinol (THC), the primary psychoactive component, and synthetic cannabinoids have been associated with seizures, especially at high doses or in susceptible individuals.  4)Class I obesity- -Low calorie diet, portion control and increase physical activity discussed with patient -Body mass index is 34.44 kg/m.  5)HTN--- hold valsartan /chlorthalidone  due to hyponatremia, hypokalemia and contrast exposure --- May use IV hydralazine  as needed elevated BP  6)Rt Hip Pain---??  Seizure related, no obvious deformity- -hip and pelvic x-rays without acute findings patient does have prior Rt hip replacement   Advance Care Planning:   Code Status: Full Code   Consults: Neurology  Family Communication: Discussed with patient's husband and patient's sister at bedside  Severity of Illness: The appropriate patient status for this patient is INPATIENT. Inpatient status is judged to be reasonable and necessary in order to provide the required intensity of service to ensure the patient's safety. The patient's presenting symptoms, physical exam findings, and initial radiographic and laboratory data in the context of their chronic comorbidities is felt to place them at high risk for further clinical deterioration. Furthermore, it is not anticipated that the patient will be medically stable for discharge from the hospital within 2 midnights of admission.   * I certify that at the point of admission it is my clinical judgment that the patient will require inpatient hospital care spanning beyond 2  midnights from the point of admission due to high intensity of service, high risk for further deterioration and high frequency of surveillance required.*  Author: Rendall Carwin, MD 09/25/2023 4:40 PM  For on call review [email or web address].

## 2023-09-25 NOTE — ED Provider Notes (Signed)
 [place] EMERGENCY DEPARTMENT AT [facility] Provider Note   CSN: [id number] Arrival date & time: 09/25/23  9340     Patient presents with: Seizures   Carol Sharp is a 48 y.o. female.   48 year old female with a history of anxiety on Xanax  and hypertension who presents to the emergency department due to concerns for seizure.  Patient accompanied by family and her husband.  They report that at 5:30 AM this morning her husband started feeling shaking of the patient's right lower extremity.  Lasted for approximately 3 minutes.  Did bite her tongue.  Also had urinary incontinence.  Was confused afterwards.  1 month ago bit her tongue at night while she was sleeping but did not notice any seizure-like activity at that time.  Only drinks alcohol  occasionally.  Take Xanax  as prescribed.  No personal history of seizures.  Mother did have grand mal seizures.       Prior to Admission medications   Medication Sig Start Date End Date Taking? Authorizing Provider  ALPRAZolam  (XANAX ) 1 MG tablet TAKE ONE TABLET BY MOUTH AT BEDTIME AS NEEDED FOR INSOMNIA. DO NOT TAKE WITH NARCOTICS. 09/16/23  Yes Alphonsa Glendia LABOR, MD  chlorthalidone  (HYGROTON ) 50 MG tablet Take 0.5 tablets (25 mg total) by mouth in the morning. 04/05/23  Yes Alphonsa Glendia LABOR, MD  diclofenac  (VOLTAREN ) 75 MG EC tablet Take 1 tablet (75 mg total) by mouth 2 (two) times daily. Patient taking differently: Take 150 mg by mouth in the morning. 10/19/22  Yes Alphonsa Glendia LABOR, MD  DULoxetine  (CYMBALTA ) 60 MG capsule TAKE ONE CAPSULE BY MOUTH DAILY 07/25/23  Yes Hoskins, Carolyn C, NP  gabapentin  (NEURONTIN ) 300 MG capsule Take 900 mg by mouth 3 (three) times daily with meals.   Yes [provider]  ondansetron  (ZOFRAN ) 4 MG tablet Take 1 tablet (4 mg total) by mouth every 8 (eight) hours as needed for nausea or vomiting. 02/08/23 02/08/24 Yes Hill, Valery RAMAN, PA-C  REPATHA  SURECLICK 140 MG/ML SOAJ INJECT 140MG  UNDER THE SKIN  EVERY 2 WEEKS. 05/24/23  Yes Mauro Elveria BROCKS, NP  traZODone  (DESYREL ) 50 MG tablet TAKE 1-2 TABLETS BY MOUTH AT BEDTIME 08/07/23  Yes Hoskins, Carolyn C, NP  valsartan  (DIOVAN ) 80 MG tablet Take 1 tablet (80 mg total) by mouth in the morning. 03/25/23  Yes Alphonsa Glendia LABOR, MD    Allergies: Furosemide, Lipitor [atorvastatin ], Amlodipine , and Sulfa antibiotics    Review of Systems  Updated Vital Signs BP 126/86   Pulse 69   Temp 98 F (36.7 C) (Oral)   Resp 14   Ht 5' 10 (1.778 m)   Wt 108.9 kg   LMP 02/14/2021   SpO2 96%   BMI 34.44 kg/m   Physical Exam Vitals and nursing note reviewed.  Constitutional:      General: She is not in acute distress.    Appearance: She is well-developed.  HENT:     Head: Normocephalic and atraumatic.     Right Ear: External ear normal.     Left Ear: External ear normal.     Nose: Nose normal.  Eyes:     Extraocular Movements: Extraocular movements intact.     Conjunctiva/sclera: Conjunctivae normal.     Pupils: Pupils are equal, round, and reactive to light.     Comments: Pupils 4 mm bilaterally  Musculoskeletal:     Cervical back: Normal range of motion and neck supple.     Right lower leg: No edema.  Left lower leg: No edema.  Skin:    General: Skin is warm and dry.  Neurological:     Mental Status: She is alert and oriented to person, place, and time. Mental status is at baseline.     Comments: MENTAL STATUS: AAOx3 CRANIAL NERVES: II: Pupils equal and reactive 4 mm BL, no RAPD, no VF deficits III, IV, VI: EOM intact, no gaze preference or deviation, no nystagmus. V: normal sensation to light touch in V1, V2, and V3 segments bilaterally VII: no facial weakness or asymmetry, no nasolabial fold flattening VIII: normal hearing to speech and finger friction IX, X: normal palatal elevation, no uvular deviation XI: 5/5 head turn and 5/5 shoulder shrug bilaterally XII: midline tongue protrusion MOTOR: 5/5 strength in R shoulder  flexion, elbow flexion and extension, and grip strength. 5/5 strength in L shoulder flexion, elbow flexion and extension, and grip strength.  4/5 strength in R hip and knee extension limited due to pain since she had surgery in her right hip.  5/5 strength in right ankle dorsiflexion.  5/5 strength in L hip and knee flexion, knee extension, ankle plantar and dorsiflexion. SENSORY: Normal sensation to light touch in all extremities COORD: Normal finger to nose and heel to shin, no tremor, no dysmetria  Psychiatric:        Mood and Affect: Mood normal.     (all labs ordered are listed, but only abnormal results are displayed) Labs Reviewed  COMPREHENSIVE METABOLIC PANEL WITH GFR - Abnormal; Notable for the following components:      Result Value   Sodium 131 (*)    Potassium 3.2 (*)    Glucose, Bld 105 (*)    AST 81 (*)    ALT 103 (*)    All other components within normal limits  CBC WITH DIFFERENTIAL/PLATELET - Abnormal; Notable for the following components:   Hemoglobin 15.4 (*)    All other components within normal limits  CBG MONITORING, ED - Abnormal; Notable for the following components:   Glucose-Capillary 103 (*)    All other components within normal limits  MAGNESIUM  ETHANOL  HCG, SERUM, QUALITATIVE  URINALYSIS, ROUTINE W REFLEX MICROSCOPIC  RAPID URINE DRUG SCREEN, HOSP PERFORMED  PROLACTIN  CK    EKG: EKG Interpretation Date/Time:  Wednesday September 25 2023 07:33:39 EDT Ventricular Rate:  75 PR Interval:  180 QRS Duration:  96 QT Interval:  406 QTC Calculation: 454 R Axis:   241  Text Interpretation: Right and left arm electrode reversal, interpretation assumes no reversal Ectopic atrial rhythm Probable lateral infarct, age indeterminate Confirmed by Yolande Charleston [phone]) on 09/25/2023 9:24:22 AM  Radiology: CT Head Wo Contrast Result Date: 09/25/2023 EXAM: CT HEAD WITHOUT CONTRAST 09/25/2023 08:03:21 AM TECHNIQUE: CT of the head was performed without the  administration of intravenous contrast. Automated exposure control, iterative reconstruction, and/or weight based adjustment of the mA/kV was utilized to reduce the radiation dose to as low as reasonably achievable. COMPARISON: None available. CLINICAL HISTORY: First time seizure with right lower extremity shaking. Patient was unresponsive and post-ictal for about 25 minutes. Reports headache behind both eyes and leg cramps. FINDINGS: BRAIN AND VENTRICLES: No acute hemorrhage. Gray-white differentiation is preserved. No hydrocephalus. No extra-axial collection. No mass effect or midline shift. An expanded relatively empty sella is noted. ORBITS: No acute abnormality. SINUSES: No acute abnormality. SOFT TISSUES AND SKULL: No acute soft tissue abnormality. No skull fracture. VASCULATURE: Atherosclerotic calcifications are present in the cavernous carotid arteries bilaterally. No hyperdense  vessel is present. IMPRESSION: 1. No acute intracranial abnormality related to the clinical history of first time seizure. 2. Expanded relatively empty sella in a patient under the age of 42 raises the possibility of idiopathic intracranial hypertension. Electronically signed by: Lonni Necessary MD 09/25/2023 08:13 AM EDT RP Workstation: [id number]     Procedures   Medications Ordered in the ED  LORazepam  (ATIVAN ) injection 1 mg (has no administration in time range)  cyclobenzaprine  (FLEXERIL ) tablet 10 mg (10 mg Oral Given 09/25/23 0828)  ketorolac  (TORADOL ) 15 MG/ML injection 15 mg (15 mg Intravenous Given 09/25/23 0929)  potassium chloride  SA (KLOR-CON  M) CR tablet 40 mEq (40 mEq Oral Given 09/25/23 1018)    Clinical Course as of 09/25/23 1028  Wed Sep 25, 2023  0952 Dr Merrianne from neuro consulted. Recommends MRI wwo brain and spot EEG and start AED at this time. Do formal consult with neuro here.  Likely will need to be started on keppra . [RP]  1024 Discussed with Dr Pearlean from hospitalist to admit the patient.  [RP]    Clinical Course User Index [RP] Yolande Lamar BROCKS, MD                                 Medical Decision Making Amount and/or Complexity of Data Reviewed Labs: ordered. Radiology: ordered.  Risk Prescription drug management.   48 year old female with a history of anxiety on Xanax  and hypertension who presents to the emergency department due to concerns for seizure  Initial Ddx:  Seizure, syncope, ICH, meningitis, brain mass, electrolyte abnormality  MDM/Course:  Patient presents emergency department with a seizure that occurred today.  Also had an episode where she bit her tongue a month ago that may have been a seizure that was unwitnessed while she was sleeping.  No heavy alcohol  use or suggest alcohol  withdrawals.  On exam she is alert and oriented.  No neurologic deficits.  No meningismus or other signs or symptoms of meningitis.  Head CT showed empty sella without any other acute findings.  Blood work showed a sodium of 131 and potassium 3.2 with marginally elevated LFTs.  Discussed with neurology who wants her admitted for an MRI of the brain with and without contrast as well as spot EEG.  Discussed with hospitalist any pending feels that admission to [facility] would be in the patient's best interest at this time.    This patient presents to the ED for concern of complaints listed in HPI, this involves an extensive number of treatment options, and is a complaint that carries with it a high risk of complications and morbidity. Disposition including potential need for admission considered.   Dispo: Admit to Floor  Additional history obtained from family Records reviewed Outpatient Clinic Notes The following labs were independently interpreted: Chemistry and show mild hyponatremia hypokalemia I independently reviewed the following imaging with scope of interpretation limited to determining acute life threatening conditions related to emergency care: CT Head and agree with  the radiologist interpretation with the following exceptions: none I personally reviewed and interpreted cardiac monitoring: normal sinus rhythm  I personally reviewed and interpreted the pt's EKG: see above for interpretation  I have reviewed the patients home medications and made adjustments as needed Consults: Hospitalist and Neurology  Portions of this note were generated with Scientist, clinical (histocompatibility and immunogenetics). Dictation errors may occur despite best attempts at proofreading.     Final diagnoses:  Seizure (HCC)  ED Discharge Orders     None          Yolande Lamar BROCKS, MD 09/25/23 1028

## 2023-09-25 NOTE — ED Triage Notes (Signed)
 Pt bib [facility] after a call reporting pt spouse woke up to pt being unresponsive. Pt has lacerations to the left side of her tongue, had reported incontinence, post-ictal stated for about 25 minutes. EMS reports pt was unable to follow commands or recognize familiar places. CBG 96 w/ ems. Pt reports a headache behind both eyes and leg cramps. PT reports feeling normal when she went to sleep at 8:30-9;00

## 2023-09-25 NOTE — Plan of Care (Signed)
   Problem: Education: Goal: Knowledge of General Education information will improve Description: Including pain rating scale, medication(s)/side effects and non-pharmacologic comfort measures Outcome: Progressing   Problem: Clinical Measurements: Goal: Diagnostic test results will improve Outcome: Progressing   Problem: Activity: Goal: Risk for activity intolerance will decrease Outcome: Progressing

## 2023-09-26 ENCOUNTER — Inpatient Hospital Stay (HOSPITAL_COMMUNITY)

## 2023-09-26 ENCOUNTER — Other Ambulatory Visit (HOSPITAL_COMMUNITY): Payer: Self-pay

## 2023-09-26 DIAGNOSIS — F32A Depression, unspecified: Secondary | ICD-10-CM

## 2023-09-26 DIAGNOSIS — F4323 Adjustment disorder with mixed anxiety and depressed mood: Secondary | ICD-10-CM

## 2023-09-26 DIAGNOSIS — I10 Essential (primary) hypertension: Secondary | ICD-10-CM | POA: Diagnosis not present

## 2023-09-26 DIAGNOSIS — F419 Anxiety disorder, unspecified: Secondary | ICD-10-CM

## 2023-09-26 DIAGNOSIS — R569 Unspecified convulsions: Secondary | ICD-10-CM | POA: Diagnosis not present

## 2023-09-26 LAB — BASIC METABOLIC PANEL WITH GFR
Anion gap: 9 (ref 5–15)
BUN: 10 mg/dL (ref 6–20)
CO2: 23 mmol/L (ref 22–32)
Calcium: 8.8 mg/dL — ABNORMAL LOW (ref 8.9–10.3)
Chloride: 100 mmol/L (ref 98–111)
Creatinine, Ser: 1.01 mg/dL — ABNORMAL HIGH (ref 0.44–1.00)
GFR, Estimated: >60 mL/min (ref >60)
Glucose, Bld: 94 mg/dL (ref 70–99)
Potassium: 3.5 mmol/L (ref 3.5–5.1)
Sodium: 132 mmol/L — ABNORMAL LOW (ref 135–145)

## 2023-09-26 LAB — CK: Total CK: 1528 U/L — ABNORMAL HIGH (ref 38–234)

## 2023-09-26 MED ORDER — LEVETIRACETAM 500 MG PO TABS
500.0000 mg | ORAL_TABLET | Freq: Two times a day (BID) | ORAL | 2 refills | Status: DC
  Filled 2023-09-26: qty 60, 30d supply, fill #0

## 2023-09-26 NOTE — Discharge Summary (Signed)
 PATIENT DETAILS Name: Carol Sharp Age: 48 y.o. Sex: female Date of Birth: May 27, 1975 MRN: [id number]. Admitting Physician: Rendall Carwin, MD ERE:Olxpwh, Glendia LABOR, MD  Admit Date: 09/25/2023 Discharge date: 09/26/2023  Recommendations for Outpatient Follow-up:  Follow up with PCP in 1-2 weeks Please obtain CMP/CBC in one week Please ensure follow-up with outpatient neurology.  Admitted From:  Home  Disposition: Home   Discharge Condition: good  CODE STATUS:   Code Status: Full Code   Diet recommendation:  Diet Order             Diet regular Room service appropriate? Yes; Fluid consistency: Thin  Diet effective now           Diet general                    Brief Summary: 48 year old with history of depression/anxiety, HTN, peripheral neuropathy who presented with third episode of seizures.  She was initially seen at [facility] and transferred to [facility] for neurology evaluation.  Significant studies 8/6>> MRI brain: No acute abnormalities.  No cause for first-time seizures identified. 8/6>> UDS: Positive for THC. 8/7>> EEG: No seizures  Brief Hospital Course: Seizure disorder Evaluated by neurology-underwent workup-see above Continue Keppra  on discharge Outpatient neurology follow-up-epic referral sent to neurology Driving/seizure precautions-discussed with patient-see instructions below.  Rhabdomyolysis Mild Supportive care Should improve with gentle oral hydration.  Mood disorder Cymbalta   Peripheral neuropathy Neurontin   Marijuana use Counseled against further use.  Class 3 Obesity: Estimated body mass index is 37.14 kg/m as calculated from the following:   Height as of this encounter: 5' 10 (1.778 m).   Weight as of this encounter: 117.4 kg.    Discharge Diagnoses:  Principal Problem:   Seizure (HCC) Active Problems:   Adjustment disorder with mixed anxiety and depressed mood   Benign essential hypertension   Insomnia    Anxiety and depression   Discharge Instructions:  Activity:  As tolerated    Discharge Instructions     Ambulatory referral to Neurology   Complete by: As directed    An appointment is requested in approximately: 4 weeks   Call MD for:  extreme fatigue   Complete by: As directed    Call MD for:  persistant dizziness or light-headedness   Complete by: As directed    Diet general   Complete by: As directed    Discharge instructions   Complete by: As directed    Follow with Primary MD  Alphonsa Glendia LABOR, MD in 1-2 weeks  Please get a complete blood count and chemistry panel checked by your Primary MD at your next visit, and again as instructed by your Primary MD.  Get Medicines reviewed and adjusted: Please take all your medications with you for your next visit with your Primary MD  Laboratory/radiological data: Please request your Primary MD to go over all hospital tests and procedure/radiological results at the follow up, please ask your Primary MD to get all Hospital records sent to his/her office.  In some cases, they will be blood work, cultures and biopsy results pending at the time of your discharge. Please request that your primary care M.D. follows up on these results.  Also Note the following: If you experience worsening of your admission symptoms, develop shortness of breath, life threatening emergency, suicidal or homicidal thoughts you must seek medical attention immediately by calling 911 or calling your MD immediately  if symptoms less severe.  You must read complete  instructions/literature along with all the possible adverse reactions/side effects for all the Medicines you take and that have been prescribed to you. Take any new Medicines after you have completely understood and accpet all the possible adverse reactions/side effects.   Do not drive when taking Pain medications or sleeping medications (Benzodaizepines)  Do not take more than prescribed Pain, Sleep  and Anxiety Medications. It is not advisable to combine anxiety,sleep and pain medications without talking with your primary care practitioner  Special Instructions: If you have smoked or chewed Tobacco  in the last 2 yrs please stop smoking, stop any regular Alcohol   and or any Recreational drug use.  Wear Seat belts while driving.  Please note: You were cared for by a hospitalist during your hospital stay. Once you are discharged, your primary care physician will handle any further medical issues. Please note that NO REFILLS for any discharge medications will be authorized once you are discharged, as it is imperative that you return to your primary care physician (or establish a relationship with a primary care physician if you do not have one) for your post hospital discharge needs so that they can reassess your need for medications and monitor your lab values.     Seizure precautions: Per   DMV statutes, patients with seizures are not allowed to drive until they have been seizure-free for six months and cleared by a physician    Use caution when using heavy equipment or power tools. Avoid working on ladders or at heights. Take showers instead of baths. Ensure the water  temperature is not too high on the home water  heater. Do not go swimming alone. Do not lock yourself in a room alone (i.e. bathroom). When caring for infants or small children, sit down when holding, feeding, or changing them to minimize risk of injury to the child in the event you have a seizure. Maintain good sleep hygiene. Avoid alcohol .    If patient has another seizure, call 911 and bring them back to the ED if: A.  The seizure lasts longer than 5 minutes.      B.  The patient doesn't wake shortly after the seizure or has new problems such as difficulty seeing, speaking or moving following the seizure C.  The patient was injured during the seizure D.  The patient has a temperature over 102 F (39C) E.  The  patient vomited during the seizure and now is having trouble breathing    During the Seizure   - First, ensure adequate ventilation and place patients on the floor on their left side  Loosen clothing around the neck and ensure the airway is patent. If the patient is clenching the teeth, do not force the mouth open with any object as this can cause severe damage - Remove all items from the surrounding that can be hazardous. The patient may be oblivious to what's happening and may not even know what he or she is doing. If the patient is confused and wandering, either gently guide him/her away and block access to outside areas - Reassure the individual and be comforting - Call 911. In most cases, the seizure ends before EMS arrives. However, there are cases when seizures may last over 3 to 5 minutes. Or the individual may have developed breathing difficulties or severe injuries. If a pregnant patient or a person with diabetes develops a seizure, it is prudent to call an ambulance. - Finally, if the patient does not regain full consciousness, then call EMS.  Most patients will remain confused for about 45 to 90 minutes after a seizure, so you must use judgment in calling for help. - Avoid restraints but make sure the patient is in a bed with padded side rails - Place the individual in a lateral position with the neck slightly flexed; this will help the saliva drain from the mouth and prevent the tongue from falling backward - Remove all nearby furniture and other hazards from the area - Provide verbal assurance as the individual is regaining consciousness - Provide the patient with privacy if possible - Call for help and start treatment as ordered by the caregiver    After the Seizure (Postictal Stage)   After a seizure, most patients experience confusion, fatigue, muscle pain and/or a headache. Thus, one should permit the individual to sleep. For the next few days, reassurance is essential. Being  calm and helping reorient the person is also of importance.   Most seizures are painless and end spontaneously. Seizures are not harmful to others but can lead to complications such as stress on the lungs, brain and the heart. Individuals with prior lung problems may develop labored breathing and respiratory distress.    Increase activity slowly   Complete by: As directed       Allergies as of 09/26/2023       Reactions   Furosemide Nausea And Vomiting   Lipitor [atorvastatin ] Other (See Comments)   Joint pains-  severe   Amlodipine  Swelling   Sulfa Antibiotics Hives        Medication List     STOP taking these medications    Repatha  SureClick 140 MG/ML Soaj Generic drug: Evolocumab        TAKE these medications    DULoxetine  60 MG capsule Commonly known as: CYMBALTA  TAKE ONE CAPSULE BY MOUTH DAILY   gabapentin  300 MG capsule Commonly known as: NEURONTIN  Take 900 mg by mouth 3 (three) times daily with meals.   levETIRAcetam  500 MG tablet Commonly known as: KEPPRA  Take 1 tablet (500 mg total) by mouth 2 (two) times daily.        Allergies  Allergen Reactions   Furosemide Nausea And Vomiting   Lipitor [Atorvastatin ] Other (See Comments)    Joint pains-  severe   Amlodipine  Swelling   Sulfa Antibiotics Hives     Other Procedures/Studies: EEG adult Result Date: 09/26/2023 Shelton Arlin KIDD, MD     09/26/2023  9:40 AM Patient Name: Carol Sharp MRN: [id number] Epilepsy Attending: Arlin KIDD Shelton Referring Physician/Provider: Pearlean Manus, MD Date: 09/26/2023 Duration: 27.12 mins Patient history: 48 y.o. female with a witnessed seizure. EEG to evaluate for seizure. Level of alertness: Awake, asleep AEDs during EEG study: LEV, GBP Technical aspects: This EEG study was done with scalp electrodes positioned according to the 10-20 International system of electrode placement. Electrical activity was reviewed with band pass filter of 1-70Hz , sensitivity of 7 uV/mm,  display speed of 63mm/sec with a 60Hz  notched filter applied as appropriate. EEG data were recorded continuously and digitally stored.  Video monitoring was available and reviewed as appropriate. Description: The posterior dominant rhythm consists of 10 Hz activity of moderate voltage (25-35 uV) seen predominantly in posterior head regions, symmetric and reactive to eye opening and eye closing. Sleep was characterized by vertex waves, sleep spindles (12 to 14 Hz), maximal frontocentral region. Hyperventilation and photic stimulation were not performed.   IMPRESSION: This study is within normal limits. No seizures or epileptiform discharges were seen throughout the  recording. A normal interictal EEG does not exclude the diagnosis of epilepsy. Priyanka O Yadav   MR BRAIN W WO CONTRAST Result Date: 09/25/2023 CLINICAL DATA:  Mental status change of unknown cause. EXAM: MRI HEAD WITHOUT AND WITH CONTRAST TECHNIQUE: Multiplanar, multiecho pulse sequences of the brain and surrounding structures were obtained without and with intravenous contrast. CONTRAST:  10mL GADAVIST  GADOBUTROL  1 MMOL/ML IV SOLN COMPARISON:  Head CT earlier same day FINDINGS: Brain: Diffusion imaging does not show any acute or subacute infarction or other cause of restricted diffusion. No focal abnormality affects the brainstem or cerebellum. Cerebral hemispheres appear normally formed. The only imaging abnormality is a 3 mm focus of T2 and FLAIR signal within the subcortical white matter in the left posterior frontal lobe. As an isolated finding, this would not likely be significant. This would be unlikely to be a cause of seizure. No evidence of mass, hemorrhage, hydrocephalus or extra-axial collection. Mesial temporal lobes appear normal. After contrast administration, no abnormal enhancement occurs. As noted on the CT, there is arachnoid herniation into the sella. This can be a normal variant or can be associated with intracranial hypertension.  Vascular: Major vessels at the base of the brain show flow. Skull and upper cervical spine: Negative Sinuses/Orbits: Clear/normal Other: None IMPRESSION: 1. No acute or reversible finding. No cause of first-time seizure is identified. 2. 3 mm focus of T2 and FLAIR signal within the subcortical white matter of the left posterior frontal lobe. As an isolated finding, this would not likely be significant. This would be unlikely to be a cause of seizure. 3. Arachnoid herniation into the sella. This can be a normal variant or can be associated with intracranial hypertension. Electronically Signed   By: Oneil Officer M.D.   On: 09/25/2023 15:11   DG HIP UNILAT W OR W/O PELVIS 2-3 VIEWS RIGHT Result Date: 09/25/2023 CLINICAL DATA:  Right hip pain. EXAM: DG HIP (WITH OR WITHOUT PELVIS) 2-3V RIGHT COMPARISON:  02/07/2023 FINDINGS: Changes of right hip replacement. No hardware complicating feature. No acute bony abnormality. Specifically, no fracture, subluxation, or dislocation. IMPRESSION: Right hip replacement.  No acute bony abnormality. Electronically Signed   By: Franky Crease M.D.   On: 09/25/2023 13:21   CT Head Wo Contrast Result Date: 09/25/2023 EXAM: CT HEAD WITHOUT CONTRAST 09/25/2023 08:03:21 AM TECHNIQUE: CT of the head was performed without the administration of intravenous contrast. Automated exposure control, iterative reconstruction, and/or weight based adjustment of the mA/kV was utilized to reduce the radiation dose to as low as reasonably achievable. COMPARISON: None available. CLINICAL HISTORY: First time seizure with right lower extremity shaking. Patient was unresponsive and post-ictal for about 25 minutes. Reports headache behind both eyes and leg cramps. FINDINGS: BRAIN AND VENTRICLES: No acute hemorrhage. Gray-white differentiation is preserved. No hydrocephalus. No extra-axial collection. No mass effect or midline shift. An expanded relatively empty sella is noted. ORBITS: No acute abnormality.  SINUSES: No acute abnormality. SOFT TISSUES AND SKULL: No acute soft tissue abnormality. No skull fracture. VASCULATURE: Atherosclerotic calcifications are present in the cavernous carotid arteries bilaterally. No hyperdense vessel is present. IMPRESSION: 1. No acute intracranial abnormality related to the clinical history of first time seizure. 2. Expanded relatively empty sella in a patient under the age of 86 raises the possibility of idiopathic intracranial hypertension. Electronically signed by: Lonni Necessary MD 09/25/2023 08:13 AM EDT RP Workstation: [id number]     TODAY-DAY OF DISCHARGE:  Subjective:   Carol Sharp today has no headache,no chest abdominal  pain,no new weakness tingling or numbness, feels much better wants to go home today.   Objective:   Blood pressure 127/85, pulse 69, temperature 98.2 F (36.8 C), temperature source Oral, resp. rate 20, height 5' 10 (1.778 m), weight 117.4 kg, last menstrual period 02/14/2021, SpO2 97%.  Intake/Output Summary (Last 24 hours) at 09/26/2023 1050 Last data filed at 09/26/2023 0551 Gross per 24 hour  Intake 723.53 ml  Output --  Net 723.53 ml   Filed Weights   09/25/23 0720 09/26/23 0500  Weight: 108.9 kg 117.4 kg    Exam: Awake Alert, Oriented *3, No new F.N deficits, Normal affect Kerrick.AT,PERRAL Supple Neck,No JVD, No cervical lymphadenopathy appriciated.  Symmetrical Chest wall movement, Good air movement bilaterally, CTAB RRR,No Gallops,Rubs or new Murmurs, No Parasternal Heave +ve B.Sounds, Abd Soft, Non tender, No organomegaly appriciated, No rebound -guarding or rigidity. No Cyanosis, Clubbing or edema, No new Rash or bruise   PERTINENT RADIOLOGIC STUDIES: EEG adult Result Date: 09/26/2023 Shelton Arlin KIDD, MD     09/26/2023  9:40 AM Patient Name: Carol Sharp MRN: [id number] Epilepsy Attending: Arlin KIDD Shelton Referring Physician/Provider: Pearlean Manus, MD Date: 09/26/2023 Duration: 27.12 mins Patient history:  48 y.o. female with a witnessed seizure. EEG to evaluate for seizure. Level of alertness: Awake, asleep AEDs during EEG study: LEV, GBP Technical aspects: This EEG study was done with scalp electrodes positioned according to the 10-20 International system of electrode placement. Electrical activity was reviewed with band pass filter of 1-70Hz , sensitivity of 7 uV/mm, display speed of 76mm/sec with a 60Hz  notched filter applied as appropriate. EEG data were recorded continuously and digitally stored.  Video monitoring was available and reviewed as appropriate. Description: The posterior dominant rhythm consists of 10 Hz activity of moderate voltage (25-35 uV) seen predominantly in posterior head regions, symmetric and reactive to eye opening and eye closing. Sleep was characterized by vertex waves, sleep spindles (12 to 14 Hz), maximal frontocentral region. Hyperventilation and photic stimulation were not performed.   IMPRESSION: This study is within normal limits. No seizures or epileptiform discharges were seen throughout the recording. A normal interictal EEG does not exclude the diagnosis of epilepsy. Priyanka O Yadav   MR BRAIN W WO CONTRAST Result Date: 09/25/2023 CLINICAL DATA:  Mental status change of unknown cause. EXAM: MRI HEAD WITHOUT AND WITH CONTRAST TECHNIQUE: Multiplanar, multiecho pulse sequences of the brain and surrounding structures were obtained without and with intravenous contrast. CONTRAST:  10mL GADAVIST  GADOBUTROL  1 MMOL/ML IV SOLN COMPARISON:  Head CT earlier same day FINDINGS: Brain: Diffusion imaging does not show any acute or subacute infarction or other cause of restricted diffusion. No focal abnormality affects the brainstem or cerebellum. Cerebral hemispheres appear normally formed. The only imaging abnormality is a 3 mm focus of T2 and FLAIR signal within the subcortical white matter in the left posterior frontal lobe. As an isolated finding, this would not likely be significant.  This would be unlikely to be a cause of seizure. No evidence of mass, hemorrhage, hydrocephalus or extra-axial collection. Mesial temporal lobes appear normal. After contrast administration, no abnormal enhancement occurs. As noted on the CT, there is arachnoid herniation into the sella. This can be a normal variant or can be associated with intracranial hypertension. Vascular: Major vessels at the base of the brain show flow. Skull and upper cervical spine: Negative Sinuses/Orbits: Clear/normal Other: None IMPRESSION: 1. No acute or reversible finding. No cause of first-time seizure is identified. 2. 3 mm focus of  T2 and FLAIR signal within the subcortical white matter of the left posterior frontal lobe. As an isolated finding, this would not likely be significant. This would be unlikely to be a cause of seizure. 3. Arachnoid herniation into the sella. This can be a normal variant or can be associated with intracranial hypertension. Electronically Signed   By: Oneil Officer M.D.   On: 09/25/2023 15:11   DG HIP UNILAT W OR W/O PELVIS 2-3 VIEWS RIGHT Result Date: 09/25/2023 CLINICAL DATA:  Right hip pain. EXAM: DG HIP (WITH OR WITHOUT PELVIS) 2-3V RIGHT COMPARISON:  02/07/2023 FINDINGS: Changes of right hip replacement. No hardware complicating feature. No acute bony abnormality. Specifically, no fracture, subluxation, or dislocation. IMPRESSION: Right hip replacement.  No acute bony abnormality. Electronically Signed   By: Franky Crease M.D.   On: 09/25/2023 13:21   CT Head Wo Contrast Result Date: 09/25/2023 EXAM: CT HEAD WITHOUT CONTRAST 09/25/2023 08:03:21 AM TECHNIQUE: CT of the head was performed without the administration of intravenous contrast. Automated exposure control, iterative reconstruction, and/or weight based adjustment of the mA/kV was utilized to reduce the radiation dose to as low as reasonably achievable. COMPARISON: None available. CLINICAL HISTORY: First time seizure with right lower  extremity shaking. Patient was unresponsive and post-ictal for about 25 minutes. Reports headache behind both eyes and leg cramps. FINDINGS: BRAIN AND VENTRICLES: No acute hemorrhage. Gray-white differentiation is preserved. No hydrocephalus. No extra-axial collection. No mass effect or midline shift. An expanded relatively empty sella is noted. ORBITS: No acute abnormality. SINUSES: No acute abnormality. SOFT TISSUES AND SKULL: No acute soft tissue abnormality. No skull fracture. VASCULATURE: Atherosclerotic calcifications are present in the cavernous carotid arteries bilaterally. No hyperdense vessel is present. IMPRESSION: 1. No acute intracranial abnormality related to the clinical history of first time seizure. 2. Expanded relatively empty sella in a patient under the age of 31 raises the possibility of idiopathic intracranial hypertension. Electronically signed by: Lonni Necessary MD 09/25/2023 08:13 AM EDT RP Workstation: [id number]     PERTINENT LAB RESULTS: CBC: Recent Labs    09/25/23 0822  WBC 8.4  HGB 15.4*  HCT 43.1  PLT 349   CMET CMP     Component Value Date/Time   NA 132 (L) 09/26/2023 0755   NA 139 01/02/2023 1012   K 3.5 09/26/2023 0755   CL 100 09/26/2023 0755   CO2 23 09/26/2023 0755   GLUCOSE 94 09/26/2023 0755   BUN 10 09/26/2023 0755   BUN 16 01/02/2023 1012   CREATININE 1.01 (H) 09/26/2023 0755   CALCIUM  8.8 (L) 09/26/2023 0755   PROT 6.6 09/25/2023 0822   PROT 6.9 07/02/2022 1544   ALBUMIN 3.8 09/25/2023 0822   ALBUMIN 4.6 07/02/2022 1544   AST 81 (H) 09/25/2023 0822   ALT 103 (H) 09/25/2023 0822   ALKPHOS 70 09/25/2023 0822   BILITOT 0.7 09/25/2023 0822   BILITOT <0.2 07/02/2022 1544   EGFR 63 01/02/2023 1012   GFRNONAA >60 09/26/2023 0755    GFR Estimated Creatinine Clearance: 94.7 mL/min (A) (by C-G formula based on SCr of 1.01 mg/dL (H)). No results for input(s): LIPASE, AMYLASE in the last 72 hours. Recent Labs    09/25/23 0852  09/26/23 0755  CKTOTAL 719* 1,528*   Invalid input(s): POCBNP No results for input(s): DDIMER in the last 72 hours. No results for input(s): HGBA1C in the last 72 hours. No results for input(s): CHOL, HDL, LDLCALC, TRIG, CHOLHDL, LDLDIRECT in the last 72 hours. No results for  input(s): TSH, T4TOTAL, T3FREE, THYROIDAB in the last 72 hours.  Invalid input(s): FREET3 No results for input(s): VITAMINB12, FOLATE, FERRITIN, TIBC, IRON, RETICCTPCT in the last 72 hours. Coags: No results for input(s): INR in the last 72 hours.  Invalid input(s): PT Microbiology: No results found for this or any previous visit (from the past 240 hours).  FURTHER DISCHARGE INSTRUCTIONS:  Get Medicines reviewed and adjusted: Please take all your medications with you for your next visit with your Primary MD  Laboratory/radiological data: Please request your Primary MD to go over all hospital tests and procedure/radiological results at the follow up, please ask your Primary MD to get all Hospital records sent to his/her office.  In some cases, they will be blood work, cultures and biopsy results pending at the time of your discharge. Please request that your primary care M.D. goes through all the records of your hospital data and follows up on these results.  Also Note the following: If you experience worsening of your admission symptoms, develop shortness of breath, life threatening emergency, suicidal or homicidal thoughts you must seek medical attention immediately by calling 911 or calling your MD immediately  if symptoms less severe.  You must read complete instructions/literature along with all the possible adverse reactions/side effects for all the Medicines you take and that have been prescribed to you. Take any new Medicines after you have completely understood and accpet all the possible adverse reactions/side effects.   Do not drive when taking Pain  medications or sleeping medications (Benzodaizepines)  Do not take more than prescribed Pain, Sleep and Anxiety Medications. It is not advisable to combine anxiety,sleep and pain medications without talking with your primary care practitioner  Special Instructions: If you have smoked or chewed Tobacco  in the last 2 yrs please stop smoking, stop any regular Alcohol   and or any Recreational drug use.  Wear Seat belts while driving.  Please note: You were cared for by a hospitalist during your hospital stay. Once you are discharged, your primary care physician will handle any further medical issues. Please note that NO REFILLS for any discharge medications will be authorized once you are discharged, as it is imperative that you return to your primary care physician (or establish a relationship with a primary care physician if you do not have one) for your post hospital discharge needs so that they can reassess your need for medications and monitor your lab values.  Total Time spent coordinating discharge including counseling, education and face to face time equals greater than 30 minutes.  Signed: Ivry Pigue 09/26/2023 10:50 AM

## 2023-09-26 NOTE — Progress Notes (Signed)
 EEG complete - results pending

## 2023-09-26 NOTE — Procedures (Signed)
 Patient Name: RIDA LOUDIN  MRN: [id number]  Epilepsy Attending: Arlin MALVA Krebs  Referring Physician/Provider: Pearlean Manus, MD  Date: 09/26/2023 Duration: 27.12 mins  Patient history: 48 y.o. female with a witnessed seizure. EEG to evaluate for seizure.  Level of alertness: Awake, asleep  AEDs during EEG study: LEV, GBP  Technical aspects: This EEG study was done with scalp electrodes positioned according to the 10-20 International system of electrode placement. Electrical activity was reviewed with band pass filter of 1-70Hz , sensitivity of 7 uV/mm, display speed of 39mm/sec with a 60Hz  notched filter applied as appropriate. EEG data were recorded continuously and digitally stored.  Video monitoring was available and reviewed as appropriate.  Description: The posterior dominant rhythm consists of 10 Hz activity of moderate voltage (25-35 uV) seen predominantly in posterior head regions, symmetric and reactive to eye opening and eye closing. Sleep was characterized by vertex waves, sleep spindles (12 to 14 Hz), maximal frontocentral region. Hyperventilation and photic stimulation were not performed.     IMPRESSION: This study is within normal limits. No seizures or epileptiform discharges were seen throughout the recording.  A normal interictal EEG does not exclude the diagnosis of epilepsy.   Genesia Caslin O Wynne Jury

## 2023-09-27 ENCOUNTER — Telehealth: Payer: Self-pay

## 2023-09-27 LAB — PROLACTIN: Prolactin: 5.9 ng/mL (ref 4.8–33.4)

## 2023-09-27 NOTE — Transitions of Care (Post Inpatient/ED Visit) (Signed)
   09/27/2023  Name: Carol Sharp MRN: [id number] DOB: Aug 11, 1975  Today's TOC FU Call Status: Today's TOC FU Call Status:: Successful TOC FU Call Completed TOC FU Call Complete Date: 09/27/23 Patient's Name and Date of Birth confirmed.  Transition Care Management Follow-up Telephone Call Date of Discharge: 09/26/23 Discharge Facility: [facility]) Type of Discharge: Inpatient Admission Primary Inpatient Discharge Diagnosis:: seizure How have you been since you were released from the hospital?: Better Any questions or concerns?: No  Items Reviewed: Did you receive and understand the discharge instructions provided?: Yes Medications obtained,verified, and reconciled?: Yes (Medications Reviewed) Any new allergies since your discharge?: No Dietary orders reviewed?: Yes Do you have support at home?: Yes People in Home [RPT]: parent(s), spouse  Medications Reviewed Today: Medications Reviewed Today     Reviewed by Emmitt Pan, LPN (Licensed Practical Nurse) on 09/27/23 at 1157  Med List Status: <None>   Medication Order Taking? Sig Documenting Provider Last Dose Status Informant  DULoxetine  (CYMBALTA ) 60 MG capsule [id number] Yes TAKE ONE CAPSULE BY MOUTH DAILY Hoskins, Carolyn C, NP  Active Self, Pharmacy Records  gabapentin  (NEURONTIN ) 300 MG capsule [id number] Yes Take 900 mg by mouth 3 (three) times daily with meals. [provider]  Active Self, Pharmacy Records  levETIRAcetam  (KEPPRA ) 500 MG tablet [id number] Yes Take 1 tablet (500 mg total) by mouth 2 (two) times daily. Raenelle Donalda HERO, MD  Active             Home Care and Equipment/Supplies: Were Home Health Services Ordered?: NA Any new equipment or medical supplies ordered?: NA  Functional Questionnaire: Do you need assistance with bathing/showering or dressing?: No Do you need assistance with meal preparation?: No Do you need assistance with eating?: No Do you have difficulty maintaining  continence: No Do you need assistance with getting out of bed/getting out of a chair/moving?: No Do you have difficulty managing or taking your medications?: No  Follow up appointments reviewed: PCP Follow-up appointment confirmed?: Yes Date of PCP follow-up appointment?: 10/10/23 Follow-up Provider: [facility] Follow-up appointment confirmed?: No Reason Specialist Follow-Up Not Confirmed: Patient has Specialist Provider Number and will Call for Appointment Do you need transportation to your follow-up appointment?: No Do you understand care options if your condition(s) worsen?: Yes-patient verbalized understanding    SIGNATURE Pan Emmitt, LPN [facility] Nurse Health Advisor Direct Dial [phone]

## 2023-10-01 ENCOUNTER — Encounter: Payer: Self-pay | Admitting: Neurology

## 2023-10-01 ENCOUNTER — Ambulatory Visit: Admitting: Neurology

## 2023-10-01 VITALS — BP 130/78 | HR 73 | Ht 70.0 in | Wt 260.0 lb

## 2023-10-01 DIAGNOSIS — Z5181 Encounter for therapeutic drug level monitoring: Secondary | ICD-10-CM

## 2023-10-01 DIAGNOSIS — G40909 Epilepsy, unspecified, not intractable, without status epilepticus: Secondary | ICD-10-CM | POA: Diagnosis not present

## 2023-10-01 MED ORDER — LAMOTRIGINE 100 MG PO TABS: 100.0000 mg | ORAL_TABLET | Freq: Two times a day (BID) | ORAL | 3 refills | Status: DC

## 2023-10-01 MED ORDER — LAMOTRIGINE 25 MG PO TABS: ORAL_TABLET | ORAL | 0 refills | Status: DC

## 2023-10-01 NOTE — Progress Notes (Signed)
 [facility]  PATIENT: Carol Sharp DOB: 05-21-1975  REQUESTING CLINICIAN: Raenelle Donalda HERO, MD HISTORY FROM: Patient/Husband  REASON FOR VISIT: New onset seizure    HISTORICAL  CHIEF COMPLAINT:  Chief Complaint  Patient presents with   New Patient (Initial Visit)    Rm 12, NP Sz, with husband thomas, bit her tongue, loss control of bladder, right leg jerking, unresponsive    HISTORY OF PRESENT ILLNESS:  This is a 48 year old woman past medical history of hypertension, hyperlipidemia, insomnia, obesity who is presenting after her first generalized convulsion.  This was witnessed by her husband.  Husband tells me on the morning of August 6, she noticed that her wife was tapping her right leg which is normal for her prior to going to sleep but he noticed that there was an increasing in frequency then it involves the right arm and the whole body shaking.  When he turned the light on, he noticed large amount of blood coming out of her mouth.  He had difficulty waking up the patient.  EMS was called and patient taken to the hospital.  In the hospital initial workup including EEG and MRI was unrevealing. Per history patient did have unexplained tongue biting during sleep a month prior.  She was started on Keppra  500 mg twice daily at discharge.  Since starting the medication, she does report some increased irritability, and somnolence.  Patient tells me that she does not remember anything from the hospital stay.  She denies any previous history of seizures but reports that her mother was diagnosed with generalized epilepsy and she is on phenobarbital, denies any prior history of stroke, TBI, no history of brain inflammation or infection.  Handedness: Right handed   Onset: 09/25/2023, possible a month prior   Seizure Type: Generalized convulsion   Current frequency: Only once   Any injuries from seizures: Tongue biting   Seizure risk factors: Mother with epilepsy,  concussion   Previous ASMs: None   Currenty ASMs: Levetiracetam    ASMs side effects: Irritable   Brain Images: No acute findings   Previous EEGs: Normal    OTHER MEDICAL CONDITIONS: Hypertension, Hyperlipidemia, Insomnia   REVIEW OF SYSTEMS: Full 14 system review of systems performed and negative with exception of: As   ALLERGIES: Allergies  Allergen Reactions   Furosemide Nausea And Vomiting   Lipitor [Atorvastatin ] Other (See Comments)    Joint pains-  severe   Amlodipine  Swelling   Sulfa Antibiotics Hives    HOME MEDICATIONS: Outpatient Medications Prior to Visit  Medication Sig Dispense Refill   chlorthalidone  (HYGROTON ) 50 MG tablet Take 25 mg by mouth daily.     diclofenac  (VOLTAREN ) 75 MG EC tablet Take 75 mg by mouth 2 (two) times daily.     DULoxetine  (CYMBALTA ) 60 MG capsule TAKE ONE CAPSULE BY MOUTH DAILY 90 capsule 1   gabapentin  (NEURONTIN ) 300 MG capsule Take 900 mg by mouth 3 (three) times daily with meals.     levETIRAcetam  (KEPPRA ) 500 MG tablet Take 1 tablet (500 mg total) by mouth 2 (two) times daily. 60 tablet 2   valsartan  (DIOVAN ) 80 MG tablet Take 80 mg by mouth daily.     No facility-administered medications prior to visit.    PAST MEDICAL HISTORY: Past Medical History:  Diagnosis Date   Anxiety    Arthritis    Breast nodule 08/02/2015   Depression    Hypertension    MRSA cellulitis 06/2003   PONV (postoperative nausea and  vomiting)     PAST SURGICAL HISTORY: Past Surgical History:  Procedure Laterality Date   ABDOMINAL HYSTERECTOMY N/A 01/17/2022   Procedure: HYSTERECTOMY ABDOMINAL; TOTAL WITH BILATERAL SALPINGECTOMY;  Surgeon: Ozan, Jennifer, DO;  Location: [facility];  Service: Gynecology;  Laterality: N/A;   ABLATION  06/19/2008   CESAREAN SECTION     CHOLECYSTECTOMY     COLONOSCOPY WITH PROPOFOL  N/A 02/16/2021   Procedure: COLONOSCOPY WITH PROPOFOL ;  Surgeon: Cindie Carlin POUR, DO;  Location: [facility];  Service: Endoscopy;   Laterality: N/A;  1:00pm   DILATION AND CURETTAGE OF UTERUS  11/20/1998   INCISIONAL HERNIA REPAIR N/A 03/24/2021   Procedure: LAPAROSCOPIC INCISIONAL HERNIA W/MESH;  Surgeon: Mavis Anes, MD;  Location: [facility];  Service: General;  Laterality: N/A;   KNEE ARTHROSCOPY WITH MEDIAL MENISECTOMY Right 04/18/2017   Procedure: RIGHT KNEE ARTHROSCOPY WITH PARTIAL  MEDIAL MENISECTOMY;  Surgeon: Margrette Taft BRAVO, MD;  Location: [facility];  Service: Orthopedics;  Laterality: Right;   TONSILLECTOMY  1981?   TOTAL HIP ARTHROPLASTY Right 02/07/2023   Procedure: TOTAL HIP ARTHROPLASTY ANTERIOR APPROACH;  Surgeon: Fidel Rogue, MD;  Location: [facility];  Service: Orthopedics;  Laterality: Right;   TUBAL LIGATION      FAMILY HISTORY: Family History  Problem Relation Age of Onset   Cancer Paternal Grandmother        breast   Cancer Maternal Grandmother        colon   Hyperlipidemia Father    Hypertension Mother    Diabetes Mother    Hyperlipidemia Mother    Thyroid disease Mother    Seizures Mother    Cancer Paternal Aunt        breast   Cancer Paternal Aunt        breast   Cancer Paternal Aunt        breast    SOCIAL HISTORY: Social History   Socioeconomic History   Marital status: Married    Spouse name: Not on file   Number of children: Not on file   Years of education: Not on file   Highest education level: Not on file  Occupational History   Not on file  Tobacco Use   Smoking status: Every Day    Current packs/day: 0.50    Average packs/day: 0.5 packs/day for 15.0 years (7.5 ttl pk-yrs)    Types: Cigarettes   Smokeless tobacco: Never  Vaping Use   Vaping status: Never Used  Substance and Sexual Activity   Alcohol  use: Yes    Comment: occasional   Drug use: Yes    Types: Marijuana   Sexual activity: Not Currently    Birth control/protection: Surgical    Comment: hyst  Other Topics Concern   Not on file  Social History Narrative   Right handed   Caffeine 2 cups    Works at home for [employer]   married   Social Drivers of Corporate investment banker Strain: Low Risk  (09/24/2023)   Received from [facility]   Overall Financial Resource Strain (CARDIA)    How hard is it for you to pay for the very basics like food, housing, medical care, and heating?: Not very hard  Food Insecurity: No Food Insecurity (09/26/2023)   Hunger Vital Sign    Worried About Running Out of Food in the Last Year: Never true    Ran Out of Food in the Last Year: Never true  Transportation Needs: No Transportation Needs (09/26/2023)  PRAPARE - Administrator, Civil Service (Medical): No    Lack of Transportation (Non-Medical): No  Physical Activity: Sufficiently Active (11/09/2020)   Exercise Vital Sign    Days of Exercise per Week: 4 days    Minutes of Exercise per Session: 40 min  Stress: Stress Concern Present (11/09/2020)   [employer] of Occupational Health - Occupational Stress Questionnaire    Feeling of Stress : Very much  Social Connections: Moderately Integrated (11/09/2020)   Social Connection and Isolation Panel    Frequency of Communication with Friends and Family: Three times a week    Frequency of Social Gatherings with Friends and Family: Once a week    Attends Religious Services: More than 4 times per year    Active Member of [employer] or Organizations: No    Attends Banker Meetings: Never    Marital Status: Married  Catering manager Violence: Not At Risk (09/26/2023)   Humiliation, Afraid, Rape, and Kick questionnaire    Fear of Current or Ex-Partner: No    Emotionally Abused: No    Physically Abused: No    Sexually Abused: No    PHYSICAL EXAM  GENERAL EXAM/CONSTITUTIONAL: Vitals:  Vitals:   10/01/23 1022  BP: 130/78  Pulse: 73  SpO2: 98%  Weight: 260 lb (117.9 kg)  Height: 5' 10 (1.778 m)   Body mass index is 37.31 kg/m. Wt Readings from Last 3 Encounters:  10/01/23 260 lb (117.9 kg)  09/26/23 258 lb  13.1 oz (117.4 kg)  04/11/23 259 lb (117.5 kg)   Patient is in no distress; well developed, nourished and groomed; neck is supple  MUSCULOSKELETAL: Gait, strength, tone, movements noted in Neurologic exam below  NEUROLOGIC: MENTAL STATUS:      No data to display         awake, alert, oriented to person, place and time recent and remote memory intact normal attention and concentration language fluent, comprehension intact, naming intact fund of knowledge appropriate  CRANIAL NERVE:  2nd, 3rd, 4th, 6th - Visual fields full to confrontation, extraocular muscles intact, no nystagmus 5th - facial sensation symmetric 7th - facial strength symmetric 8th - hearing intact 9th - palate elevates symmetrically, uvula midline 11th - shoulder shrug symmetric 12th - tongue protrusion midline  MOTOR:  normal bulk and tone, full strength in the BUE, BLE  SENSORY:  normal and symmetric to light touch  COORDINATION:  finger-nose-finger, fine finger movements normal  GAIT/STATION:  normal   DIAGNOSTIC DATA (LABS, IMAGING, TESTING) - I reviewed patient records, labs, notes, testing and imaging myself where available.  Lab Results  Component Value Date   WBC 8.4 09/25/2023   HGB 15.4 (H) 09/25/2023   HCT 43.1 09/25/2023   MCV 91.5 09/25/2023   PLT 349 09/25/2023      Component Value Date/Time   NA 132 (L) 09/26/2023 0755   NA 139 01/02/2023 1012   K 3.5 09/26/2023 0755   CL 100 09/26/2023 0755   CO2 23 09/26/2023 0755   GLUCOSE 94 09/26/2023 0755   BUN 10 09/26/2023 0755   BUN 16 01/02/2023 1012   CREATININE 1.01 (H) 09/26/2023 0755   CALCIUM  8.8 (L) 09/26/2023 0755   PROT 6.6 09/25/2023 0822   PROT 6.9 07/02/2022 1544   ALBUMIN 3.8 09/25/2023 0822   ALBUMIN 4.6 07/02/2022 1544   AST 81 (H) 09/25/2023 0822   ALT 103 (H) 09/25/2023 0822   ALKPHOS 70 09/25/2023 0822   BILITOT 0.7 09/25/2023 9177  BILITOT <0.2 07/02/2022 1544   GFRNONAA >60 09/26/2023 0755   GFRAA  96 05/19/2019 0809   Lab Results  Component Value Date   CHOL 194 07/02/2022   HDL 66 07/02/2022   LDLCALC 85 07/02/2022   TRIG 266 (H) 07/02/2022   Lab Results  Component Value Date   HGBA1C 5.3 06/18/2015   No results found for: [id number] Lab Results  Component Value Date   TSH 1.620 12/14/2016   MRI Brain 09/25/2023 1. No acute or reversible finding. No cause of first-time seizure is identified. 2. 3 mm focus of T2 and FLAIR signal within the subcortical white matter of the left posterior frontal lobe. As an isolated finding, this would not likely be significant. This would be unlikely to be a cause of seizure. 3. Arachnoid herniation into the sella. This can be a normal variant or can be associated with intracranial hypertension.  EEG 09/26/2023 This study is within normal limits. No seizures or epileptiform discharges were seen throughout the recording. A normal interictal EEG does not exclude the diagnosis of epilepsy.  I personally reviewed brain Images and previous EEG reports.   ASSESSMENT AND PLAN  48 y.o. year old female  with hypertension, hyperlipidemia, insomnia, obesity who is presenting with new onset epilepsy.  She was started on Keppra  500 mg twice daily but does have side effect of irritability and somnolence.  Plan will be to switch Keppra  to lamotrigine .  Will start with lamotrigine  25 mg daily, uptitrate weekly with a goal of 100 mg twice daily.  Once patient reaches lamotrigine  100 mg twice daily, she will decrease Keppra  to 500 mg daily for 1 week then stop the medication.  She will stop by the office in 6 to 7 weeks for lab work.  This was explained to the patient and she is comfortable with plans.  Advised her to contact me for any breakthrough seizure, or any side effect of the new medication.  Continue to follow with PCP return in 6 months or sooner if worse.   1. Nonintractable epilepsy without status epilepticus, unspecified epilepsy type (HCC)   2.  Therapeutic drug monitoring     Patient Instructions  Continue with Levetiracetam  500 mg twice daily  Start lamotrigine  as directed below  Week 1: 25mg  (one pill) at night  Week 2: 25mg  (one pill) twice daily Week 3: 50mg  (two pills) twice daily  Week 4: 75mg  (three pills) twice daily Week 5: 100mg  (fours pills) twice daily Please decrease Levetiracetam  to 500 mg daily for one week after you start taking Lamotrigine  100 mg twice daily then stop the Levetiracetam  Please stop by the office on or after week 6 for lab works Please stop the medication and call the office as soon as you develop a new rash Continue to follow up with PCP  Return in 6 to 8 months or sooner if worse      Per [place]  DMV statutes, patients with seizures are not allowed to drive until they have been seizure-free for six months.  Other recommendations include using caution when using heavy equipment or power tools. Avoid working on ladders or at heights. Take showers instead of baths.  Do not swim alone.  Ensure the water  temperature is not too high on the home water  heater. Do not go swimming alone. Do not lock yourself in a room alone (i.e. bathroom). When caring for infants or small children, sit down when holding, feeding, or changing them to minimize risk of injury to  the child in the event you have a seizure. Maintain good sleep hygiene. Avoid alcohol .  Also recommend adequate sleep, hydration, good diet and minimize stress.   During the Seizure  - First, ensure adequate ventilation and place patients on the floor on their left side  Loosen clothing around the neck and ensure the airway is patent. If the patient is clenching the teeth, do not force the mouth open with any object as this can cause severe damage - Remove all items from the surrounding that can be hazardous. The patient may be oblivious to what's happening and may not even know what he or she is doing. If the patient is confused and  wandering, either gently guide him/her away and block access to outside areas - Reassure the individual and be comforting - Call 911. In most cases, the seizure ends before EMS arrives. However, there are cases when seizures may last over 3 to 5 minutes. Or the individual may have developed breathing difficulties or severe injuries. If a pregnant patient or a person with diabetes develops a seizure, it is prudent to call an ambulance. - Finally, if the patient does not regain full consciousness, then call EMS. Most patients will remain confused for about 45 to 90 minutes after a seizure, so you must use judgment in calling for help. - Avoid restraints but make sure the patient is in a bed with padded side rails - Place the individual in a lateral position with the neck slightly flexed; this will help the saliva drain from the mouth and prevent the tongue from falling backward - Remove all nearby furniture and other hazards from the area - Provide verbal assurance as the individual is regaining consciousness - Provide the patient with privacy if possible - Call for help and start treatment as ordered by the caregiver   After the Seizure (Postictal Stage)  After a seizure, most patients experience confusion, fatigue, muscle pain and/or a headache. Thus, one should permit the individual to sleep. For the next few days, reassurance is essential. Being calm and helping reorient the person is also of importance.  Most seizures are painless and end spontaneously. Seizures are not harmful to others but can lead to complications such as stress on the lungs, brain and the heart. Individuals with prior lung problems may develop labored breathing and respiratory distress.    Discussed Patients with epilepsy have a small risk of sudden unexpected death, a condition referred to as sudden unexpected death in epilepsy (SUDEP). SUDEP is defined specifically as the sudden, unexpected, witnessed or unwitnessed,  nontraumatic and nondrowning death in patients with epilepsy with or without evidence for a seizure, and excluding documented status epilepticus, in which post mortem examination does not reveal a structural or toxicologic cause for death     Orders Placed This Encounter  Procedures   Lamotrigine  level   Basic Metabolic Panel    Meds ordered this encounter  Medications   lamoTRIgine  (LAMICTAL ) 25 MG tablet    Sig: Take 1 tablet (25 mg total) by mouth daily for 7 days, THEN 1 tablet (25 mg total) 2 (two) times daily for 7 days, THEN 2 tablets (50 mg total) 2 (two) times daily for 7 days, THEN 3 tablets (75 mg total) 2 (two) times daily for 7 days, THEN 4 tablets (100 mg total) 2 (two) times daily for 7 days.    Dispense:  150 tablet    Refill:  0   lamoTRIgine  (LAMICTAL ) 100 MG tablet  Sig: Take 1 tablet (100 mg total) by mouth 2 (two) times daily.    Dispense:  90 tablet    Refill:  3    Please dispense on or after September 15.    Return in about 6 months (around 04/02/2024).    Pastor Falling, MD 10/01/2023, 1:15 PM  [facility] [address], [address] [place], [place] [address] [phone]

## 2023-10-01 NOTE — Patient Instructions (Signed)
 Continue with Levetiracetam  500 mg twice daily  Start lamotrigine  as directed below  Week 1: 25mg  (one pill) at night  Week 2: 25mg  (one pill) twice daily Week 3: 50mg  (two pills) twice daily  Week 4: 75mg  (three pills) twice daily Week 5: 100mg  (fours pills) twice daily Please decrease Levetiracetam  to 500 mg daily for one week after you start taking Lamotrigine  100 mg twice daily then stop the Levetiracetam  Please stop by the office on or after week 6 for lab works Please stop the medication and call the office as soon as you develop a new rash Continue to follow up with PCP  Return in 6 to 8 months or sooner if worse

## 2023-10-10 ENCOUNTER — Ambulatory Visit: Payer: Self-pay

## 2023-10-10 ENCOUNTER — Ambulatory Visit: Admitting: Family Medicine

## 2023-10-10 VITALS — BP 138/84 | HR 68 | Temp 97.3°F | Ht 70.0 in | Wt 255.4 lb

## 2023-10-10 DIAGNOSIS — E871 Hypo-osmolality and hyponatremia: Secondary | ICD-10-CM | POA: Diagnosis not present

## 2023-10-10 DIAGNOSIS — R569 Unspecified convulsions: Secondary | ICD-10-CM | POA: Diagnosis not present

## 2023-10-10 NOTE — Telephone Encounter (Signed)
 As I stated earlier I do not mind relaying information for family but at the same time due to privacy laws unless the person is on the DPR I am not allowed to give additional information I dictated everything I told her into after visit summary I printed the after visit summary Patient may have this Nurses-please make sure patient gives a verbal release to allow for a copy of after visit summary to be picked up by family member  It would be wise for Carol Sharp to make sure she adjusts her DPR to allow for various family members to represent her if she is unable to represent herself

## 2023-10-10 NOTE — Patient Instructions (Addendum)
 Hi Carol Sharp  Today we discussed your hospitalization We also discussed your seizure condition We also discussed that your MRI did not show any acute findings   Please be aware of the following #1 as we discussed today absolutely no driving for 6 months after having a seizure. #2 stick with the gradual transition of your seizure medicines as laid out by your neurologist #3 we had you do some additional blood work today after your visit we will let you know the results when they come in #4 for now you may continue the alprazolam  at nighttime that you have been on but we will be touching base with your neurologist regarding their input regarding the use of this medicine #5 you may mention today how you are working remotely but having some difficulty with cognitive skills-we did some basic testing today and although you did show some mild issues they were not severe.  It is my opinion that the seizure has caused significant disruption in your memory as well as some of your cognitive processing.  As long as you are able to do your job it is fine to continue working but you should work closely with Nurse, children's or so that they are aware of your issues.  We will do a letter.  If it gets to the point that you need a FMLA for either a short-term leave or accommodations please let us  know #6 please follow through with neurology-you have a visit in April with them #7 our staff will set up a follow-up appointment in approximately 3 months with us  but feel free to notify us  sooner if you feel you are struggling or having issues  Thanks-Dr. Glendia

## 2023-10-10 NOTE — Telephone Encounter (Signed)
 This RN spoke with patient's caregiver/ daughter and she is requesting a call back from the office regarding plan of care that was discussed at her mom's appointment today. Best number to call verified as [phone]. Will route to office for follow up.       Copied from CRM [id number]. Topic: Clinical - Red Word Triage >> Oct 10, 2023 12:30 PM Carol Sharp wrote: Red Word that prompted transfer to Nurse Triage: patient caregiver Carol Sharp calling stating patient was seen and can not remember any thing what was said possible seizure      ----------------------------------------------------------------------- From previous Reason for Contact - Other: Reason for CRM: Reason for Disposition  [1] Caller requests to speak ONLY to PCP AND [2] NON-URGENT question  Answer Assessment - Initial Assessment Questions 1. REASON FOR CALL or QUESTION: What is your reason for calling today? or How can I best     Patient is requesting information regarding what was discussed at appointment today. Patients' caregiver denies any new symptoms. Patient stated she does that remember what was discussed with provider and is frustrated.  2. CALLER: Document the source of call. (e.g., laboratory staff, caregiver or patient).     Patient's caregiver/daughter.  Protocols used: PCP Call - No Triage-A-AH

## 2023-10-10 NOTE — Progress Notes (Signed)
   Subjective:    Patient ID: Carol Sharp, female    DOB: Sep 10, 1975, 48 y.o.   MRN: [id number]  HPI Patient recently hospitalized with seizure This essentially came out of the blue she has a remote family history of seizures She also has a remote family history of Alzheimer's and she is worried about that She does have an appointment for follow-up with neurology She also had an MRI which showed some nonspecific findings but nothing concrete Will touch base with neurology regarding these    Review of Systems     Objective:   Physical Exam General-in no acute distress Eyes-no discharge Lungs-respiratory rate normal, CTA CV-no murmurs,RRR Extremities skin warm dry no edema Neuro grossly normal Behavior normal, alert        Assessment & Plan:  1. Seizure (HCC) (Primary) New onset seizures Have discussed with the patient the importance of adhering to her medicines Also absolutely no driving for 6 months until seizure-free Continue to follow through with seeing neurologist Will touch base with neurologist regarding the fact she uses alprazolam  at nighttime to help her rest Also regarding her MRI I have encouraged patient to stick with the alprazolam  to just 1 at nighttime - Basic Metabolic Panel - Magnesium - TSH + free T4  2. Hyponatremia Check lab work healthy diet avoid overhydration - Basic Metabolic Panel - Magnesium  Follow-up 4 months as planned  A message was sent to her neurologist  Addendum-family member called back stating that they talk with the patient who could not recall information from today's visit.  After visit summary was completed at the end was given to family When the patient was present with us  her recall for items was 2 out of 3 after distraction, her ability to draw clock was good but was slow and deliberate To some degree I believe the patient is still having some level of cognitive dysfunction She will look into her workplace to see  if she needs FMLA We will provide her a letter-patient requested this-so she can give this to her workplace so they can work with her regarding her issues

## 2023-10-11 LAB — BASIC METABOLIC PANEL WITH GFR
BUN/Creatinine Ratio: 19 (ref 9–23)
BUN: 21 mg/dL (ref 6–24)
CO2: 16 mmol/L — ABNORMAL LOW (ref 20–29)
Calcium: 10.4 mg/dL — ABNORMAL HIGH (ref 8.7–10.2)
Chloride: 99 mmol/L (ref 96–106)
Creatinine, Ser: 1.13 mg/dL — ABNORMAL HIGH (ref 0.57–1.00)
Glucose: 90 mg/dL (ref 70–99)
Potassium: 4.2 mmol/L (ref 3.5–5.2)
Sodium: 138 mmol/L (ref 134–144)
eGFR: 60 mL/min/1.73 (ref >59)

## 2023-10-11 LAB — TSH+FREE T4
Free T4: 1.26 ng/dL (ref 0.82–1.77)
TSH: 1.4 u[IU]/mL (ref 0.450–4.500)

## 2023-10-11 LAB — MAGNESIUM: Magnesium: 1.8 mg/dL (ref 1.6–2.3)

## 2023-10-13 ENCOUNTER — Encounter: Payer: Self-pay | Admitting: Family Medicine

## 2023-10-13 ENCOUNTER — Ambulatory Visit: Payer: Self-pay | Admitting: Family Medicine

## 2023-10-13 NOTE — Progress Notes (Signed)
 Please print letter for the patient Talk to the patient if she would like it mailed to her please do so This is a letter that she requested for her work situation

## 2023-10-14 ENCOUNTER — Other Ambulatory Visit: Payer: Self-pay

## 2023-10-14 ENCOUNTER — Telehealth: Payer: Self-pay

## 2023-10-14 ENCOUNTER — Other Ambulatory Visit: Payer: Self-pay | Admitting: Family Medicine

## 2023-10-14 DIAGNOSIS — E871 Hypo-osmolality and hyponatremia: Secondary | ICD-10-CM

## 2023-10-14 NOTE — Telephone Encounter (Signed)
 Copied from CRM #[id number]. Topic: Clinical - Prescription Issue >> Oct 14, 2023 10:29 AM Corin V wrote: Reason for CRM: Patient stated she spoke to Dr. Alphonsa about getting motion sickness patches sent in before her trip to [place] . She is leaving in about 4 hours and needs these sent in ASAP. Please sent to [facility] - [place], [place] - [address] [place] [address] Phone: [phone] Fax: [phone]  Please call: [phone]

## 2023-10-14 NOTE — Telephone Encounter (Signed)
 I reviewed over these her med list unfortunately scopolamine  interacts with her seizure medicines therefore I would not recommend this.  I spoke with the patient in person here at the office

## 2023-10-19 ENCOUNTER — Other Ambulatory Visit: Payer: Self-pay | Admitting: Nurse Practitioner

## 2023-10-19 ENCOUNTER — Other Ambulatory Visit: Payer: Self-pay | Admitting: Family Medicine

## 2023-10-22 ENCOUNTER — Other Ambulatory Visit: Payer: Self-pay | Admitting: *Deleted

## 2023-10-22 ENCOUNTER — Other Ambulatory Visit: Payer: Self-pay

## 2023-10-22 ENCOUNTER — Telehealth: Payer: Self-pay

## 2023-10-22 DIAGNOSIS — R569 Unspecified convulsions: Secondary | ICD-10-CM

## 2023-10-22 MED ORDER — VALSARTAN 80 MG PO TABS: 80.0000 mg | ORAL_TABLET | Freq: Every day | ORAL | 1 refills | Status: DC

## 2023-10-22 NOTE — Telephone Encounter (Signed)
 Referral has already been placed.

## 2023-10-22 NOTE — Telephone Encounter (Signed)
 Please verify this with patient it is listed as historical medicine, #1 is she taking it?  And also desires refill?

## 2023-10-22 NOTE — Telephone Encounter (Deleted)
 Copied from CRM [id number]. Topic: General - Other >> Oct 22, 2023 11:58 AM Marissa P wrote: Reason for CRM: Daughter is calling in saying that [facility] - Pastor Falling, MD is asking for a new referral. Cannot do anything without a new one, she needs it today please [phone]

## 2023-10-23 ENCOUNTER — Ambulatory Visit (INDEPENDENT_AMBULATORY_CARE_PROVIDER_SITE_OTHER): Admitting: Neurology

## 2023-10-23 ENCOUNTER — Encounter: Payer: Self-pay | Admitting: Neurology

## 2023-10-23 ENCOUNTER — Ambulatory Visit: Admitting: Family Medicine

## 2023-10-23 ENCOUNTER — Other Ambulatory Visit: Payer: Self-pay | Admitting: Nurse Practitioner

## 2023-10-23 ENCOUNTER — Other Ambulatory Visit: Payer: Self-pay | Admitting: Family Medicine

## 2023-10-23 VITALS — BP 129/90 | HR 65 | Ht 69.0 in | Wt 255.0 lb

## 2023-10-23 DIAGNOSIS — R413 Other amnesia: Secondary | ICD-10-CM

## 2023-10-23 DIAGNOSIS — G40909 Epilepsy, unspecified, not intractable, without status epilepticus: Secondary | ICD-10-CM

## 2023-10-23 DIAGNOSIS — F430 Acute stress reaction: Secondary | ICD-10-CM | POA: Diagnosis not present

## 2023-10-23 NOTE — Progress Notes (Signed)
 [facility]  PATIENT: Carol Sharp DOB: 07-11-1975  REQUESTING CLINICIAN: Alphonsa Glendia LABOR, MD HISTORY FROM: Patient/Husband  REASON FOR VISIT: New onset seizure    HISTORICAL  CHIEF COMPLAINT:  Chief Complaint  Patient presents with   RM12/MEMORY    Pt is here with her Husband. Pt states that she has forgotten peoples names.      INTERVAL HISTORY 10/23/2023 Patient presents today for follow-up, last visit was August 12, at that time plan was to switch levetiracetam  to lamotrigine .  Currently she is on lamotrigine  75 mg twice daily and levetiracetam  500 mg twice daily.  She does report some increase stress, and worsening memory.  She tells me that she forgets people's name, her memory is extremely poor, she is easily distracted, she will lose her train of thought.  This morning she just has learned that she lost her job at [employer] where she worked for more than 15 years and this is a really upsetting to her.  She is also on alprazolam  1 mg as needed but has not been taking it consistently.    HISTORY OF PRESENT ILLNESS:  This is a 48 year old woman past medical history of hypertension, hyperlipidemia, insomnia, obesity who is presenting after her first generalized convulsion.  This was witnessed by her husband.  Husband tells me on the morning of August 6, she noticed that her wife was tapping her right leg which is normal for her prior to going to sleep but he noticed that there was an increasing in frequency then it involves the right arm and the whole body shaking.  When he turned the light on, he noticed large amount of blood coming out of her mouth.  He had difficulty waking up the patient.  EMS was called and patient taken to the hospital.  In the hospital initial workup including EEG and MRI was unrevealing. Per history patient did have unexplained tongue biting during sleep a month prior.  She was started on Keppra  500 mg twice daily at discharge.   Since starting the medication, she does report some increased irritability, and somnolence.  Patient tells me that she does not remember anything from the hospital stay.  She denies any previous history of seizures but reports that her mother was diagnosed with generalized epilepsy and she is on phenobarbital, denies any prior history of stroke, TBI, no history of brain inflammation or infection.  Handedness: Right handed   Onset: 09/25/2023, possible a month prior   Seizure Type: Generalized convulsion   Current frequency: Only once   Any injuries from seizures: Tongue biting   Seizure risk factors: Mother with epilepsy, concussion   Previous ASMs: None   Currenty ASMs: Levetiracetam    ASMs side effects: Irritable   Brain Images: No acute findings   Previous EEGs: Normal    OTHER MEDICAL CONDITIONS: Hypertension, Hyperlipidemia, Insomnia   REVIEW OF SYSTEMS: Full 14 system review of systems performed and negative with exception of: As   ALLERGIES: Allergies  Allergen Reactions   Furosemide Nausea And Vomiting   Lipitor [Atorvastatin ] Other (See Comments)    Joint pains-  severe   Amlodipine  Swelling   Sulfa Antibiotics Hives    HOME MEDICATIONS: Outpatient Medications Prior to Visit  Medication Sig Dispense Refill   ALPRAZolam  (XANAX ) 1 MG tablet Take 1 mg by mouth at bedtime as needed.     diclofenac  (VOLTAREN ) 75 MG EC tablet Take 75 mg by mouth 2 (two) times daily.  DULoxetine  (CYMBALTA ) 60 MG capsule TAKE ONE CAPSULE BY MOUTH DAILY 90 capsule 1   gabapentin  (NEURONTIN ) 300 MG capsule Take 900 mg by mouth 3 (three) times daily with meals.     lamoTRIgine  (LAMICTAL ) 25 MG tablet Take 1 tablet (25 mg total) by mouth daily for 7 days, THEN 1 tablet (25 mg total) 2 (two) times daily for 7 days, THEN 2 tablets (50 mg total) 2 (two) times daily for 7 days, THEN 3 tablets (75 mg total) 2 (two) times daily for 7 days, THEN 4 tablets (100 mg total) 2 (two) times daily for 7  days. 150 tablet 0   levETIRAcetam  (KEPPRA ) 500 MG tablet Take 1 tablet (500 mg total) by mouth 2 (two) times daily. 60 tablet 2   REPATHA  SURECLICK 140 MG/ML SOAJ Inject 140 mg into the skin.     traZODone  (DESYREL ) 50 MG tablet Take 50 mg by mouth at bedtime.     valsartan  (DIOVAN ) 80 MG tablet Take 1 tablet (80 mg total) by mouth daily. 90 tablet 1   chlorthalidone  (HYGROTON ) 50 MG tablet Take 25 mg by mouth daily.     [START ON 11/04/2023] lamoTRIgine  (LAMICTAL ) 100 MG tablet Take 1 tablet (100 mg total) by mouth 2 (two) times daily. (Patient not taking: Reported on 10/23/2023) 90 tablet 3   No facility-administered medications prior to visit.    PAST MEDICAL HISTORY: Past Medical History:  Diagnosis Date   Anxiety    Arthritis    Breast nodule 08/02/2015   Depression    Hypertension    MRSA cellulitis 06/2003   PONV (postoperative nausea and vomiting)     PAST SURGICAL HISTORY: Past Surgical History:  Procedure Laterality Date   ABDOMINAL HYSTERECTOMY N/A 01/17/2022   Procedure: HYSTERECTOMY ABDOMINAL; TOTAL WITH BILATERAL SALPINGECTOMY;  Surgeon: Marilynn Nest, DO;  Location: [facility];  Service: Gynecology;  Laterality: N/A;   ABLATION  06/19/2008   CESAREAN SECTION     CHOLECYSTECTOMY     COLONOSCOPY WITH PROPOFOL  N/A 02/16/2021   Procedure: COLONOSCOPY WITH PROPOFOL ;  Surgeon: Cindie Carlin POUR, DO;  Location: [facility];  Service: Endoscopy;  Laterality: N/A;  1:00pm   DILATION AND CURETTAGE OF UTERUS  11/20/1998   INCISIONAL HERNIA REPAIR N/A 03/24/2021   Procedure: LAPAROSCOPIC INCISIONAL HERNIA W/MESH;  Surgeon: Mavis Anes, MD;  Location: [facility];  Service: General;  Laterality: N/A;   KNEE ARTHROSCOPY WITH MEDIAL MENISECTOMY Right 04/18/2017   Procedure: RIGHT KNEE ARTHROSCOPY WITH PARTIAL  MEDIAL MENISECTOMY;  Surgeon: Margrette Taft BRAVO, MD;  Location: [facility];  Service: Orthopedics;  Laterality: Right;   TONSILLECTOMY  1981?   TOTAL HIP ARTHROPLASTY Right  02/07/2023   Procedure: TOTAL HIP ARTHROPLASTY ANTERIOR APPROACH;  Surgeon: Fidel Rogue, MD;  Location: [facility];  Service: Orthopedics;  Laterality: Right;   TUBAL LIGATION      FAMILY HISTORY: Family History  Problem Relation Age of Onset   Cancer Paternal Grandmother        breast   Cancer Maternal Grandmother        colon   Hyperlipidemia Father    Hypertension Mother    Diabetes Mother    Hyperlipidemia Mother    Thyroid disease Mother    Seizures Mother    Cancer Paternal Aunt        breast   Cancer Paternal Aunt        breast   Cancer Paternal Aunt        breast  SOCIAL HISTORY: Social History   Socioeconomic History   Marital status: Married    Spouse name: Not on file   Number of children: Not on file   Years of education: Not on file   Highest education level: Not on file  Occupational History   Not on file  Tobacco Use   Smoking status: Every Day    Current packs/day: 0.50    Average packs/day: 0.5 packs/day for 15.0 years (7.5 ttl pk-yrs)    Types: Cigarettes   Smokeless tobacco: Never  Vaping Use   Vaping status: Never Used  Substance and Sexual Activity   Alcohol  use: Yes    Comment: occasional   Drug use: Yes    Types: Marijuana   Sexual activity: Not Currently    Birth control/protection: Surgical    Comment: hyst  Other Topics Concern   Not on file  Social History Narrative   Right handed   Caffeine 2 cups   Works at home for [employer]   married   Social Drivers of Corporate investment banker Strain: Low Risk  (09/24/2023)   Received from [facility]   Overall Financial Resource Strain (CARDIA)    How hard is it for you to pay for the very basics like food, housing, medical care, and heating?: Not very hard  Food Insecurity: No Food Insecurity (09/26/2023)   Hunger Vital Sign    Worried About Running Out of Food in the Last Year: Never true    Ran Out of Food in the Last Year: Never true  Transportation Needs: No  Transportation Needs (09/26/2023)   PRAPARE - Administrator, Civil Service (Medical): No    Lack of Transportation (Non-Medical): No  Physical Activity: Sufficiently Active (11/09/2020)   Exercise Vital Sign    Days of Exercise per Week: 4 days    Minutes of Exercise per Session: 40 min  Stress: Stress Concern Present (11/09/2020)   [employer] of Occupational Health - Occupational Stress Questionnaire    Feeling of Stress : Very much  Social Connections: Moderately Integrated (11/09/2020)   Social Connection and Isolation Panel    Frequency of Communication with Friends and Family: Three times a week    Frequency of Social Gatherings with Friends and Family: Once a week    Attends Religious Services: More than 4 times per year    Active Member of [employer] or Organizations: No    Attends Banker Meetings: Never    Marital Status: Married  Catering manager Violence: Not At Risk (09/26/2023)   Humiliation, Afraid, Rape, and Kick questionnaire    Fear of Current or Ex-Partner: No    Emotionally Abused: No    Physically Abused: No    Sexually Abused: No    PHYSICAL EXAM  GENERAL EXAM/CONSTITUTIONAL: Vitals:  Vitals:   10/23/23 1146  BP: (!) 129/90  Pulse: 65  Weight: 255 lb (115.7 kg)  Height: 5' 9 (1.753 m)   Body mass index is 37.66 kg/m. Wt Readings from Last 3 Encounters:  10/23/23 255 lb (115.7 kg)  10/10/23 255 lb 6.4 oz (115.8 kg)  10/01/23 260 lb (117.9 kg)   Patient is in no distress; well developed, nourished and groomed; neck is supple, emotional crying throughout the interview   MUSCULOSKELETAL: Gait, strength, tone, movements noted in Neurologic exam below  NEUROLOGIC: MENTAL STATUS:      No data to display  10/23/2023   11:49 AM  Montreal Cognitive Assessment   Visuospatial/ Executive (0/5) 3  Naming (0/3) 2  Attention: Read list of digits (0/2) 2  Attention: Read list of letters (0/1) 1  Attention: Serial 7  subtraction starting at 100 (0/3) 0  Language: Repeat phrase (0/2) 1  Language : Fluency (0/1) 1  Abstraction (0/2) 2  Delayed Recall (0/5) 3  Orientation (0/6) 6  Total 21  Adjusted Score (based on education) 22    awake, alert, oriented to person, place and time recent and remote memory intact normal attention and concentration language fluent, comprehension intact, naming intact fund of knowledge appropriate  CRANIAL NERVE:  2nd, 3rd, 4th, 6th - Visual fields full to confrontation, extraocular muscles intact, no nystagmus 5th - facial sensation symmetric 7th - facial strength symmetric 8th - hearing intact 9th - palate elevates symmetrically, uvula midline 11th - shoulder shrug symmetric 12th - tongue protrusion midline  MOTOR:  normal bulk and tone, full strength in the BUE, BLE  SENSORY:  normal and symmetric to light touch  COORDINATION:  finger-nose-finger, fine finger movements normal  GAIT/STATION:  normal   DIAGNOSTIC DATA (LABS, IMAGING, TESTING) - I reviewed patient records, labs, notes, testing and imaging myself where available.  Lab Results  Component Value Date   WBC 8.4 09/25/2023   HGB 15.4 (H) 09/25/2023   HCT 43.1 09/25/2023   MCV 91.5 09/25/2023   PLT 349 09/25/2023      Component Value Date/Time   NA 138 10/10/2023 1115   K 4.2 10/10/2023 1115   CL 99 10/10/2023 1115   CO2 16 (L) 10/10/2023 1115   GLUCOSE 90 10/10/2023 1115   GLUCOSE 94 09/26/2023 0755   BUN 21 10/10/2023 1115   CREATININE 1.13 (H) 10/10/2023 1115   CALCIUM  10.4 (H) 10/10/2023 1115   PROT 6.6 09/25/2023 0822   PROT 6.9 07/02/2022 1544   ALBUMIN 3.8 09/25/2023 0822   ALBUMIN 4.6 07/02/2022 1544   AST 81 (H) 09/25/2023 0822   ALT 103 (H) 09/25/2023 0822   ALKPHOS 70 09/25/2023 0822   BILITOT 0.7 09/25/2023 0822   BILITOT <0.2 07/02/2022 1544   GFRNONAA >60 09/26/2023 0755   GFRAA 96 05/19/2019 0809   Lab Results  Component Value Date   CHOL 194 07/02/2022    HDL 66 07/02/2022   LDLCALC 85 07/02/2022   TRIG 266 (H) 07/02/2022   Lab Results  Component Value Date   HGBA1C 5.3 06/18/2015   No results found for: VITAMINB12 Lab Results  Component Value Date   TSH 1.400 10/10/2023   MRI Brain 09/25/2023 1. No acute or reversible finding. No cause of first-time seizure is identified. 2. 3 mm focus of T2 and FLAIR signal within the subcortical white matter of the left posterior frontal lobe. As an isolated finding, this would not likely be significant. This would be unlikely to be a cause of seizure. 3. Arachnoid herniation into the sella. This can be a normal variant or can be associated with intracranial hypertension.  EEG 09/26/2023 This study is within normal limits. No seizures or epileptiform discharges were seen throughout the recording. A normal interictal EEG does not exclude the diagnosis of epilepsy.  I personally reviewed brain Images and previous EEG reports.   ASSESSMENT AND PLAN  48 y.o. year old female  with hypertension, hyperlipidemia, insomnia, obesity who is presenting for follow-up for her epilepsy and worsening memory.  Since last visit 3 weeks ago she has not had any seizures.  She is current on lamotrigine  75 mg twice daily and levetiracetam  500 mg twice daily.  Plan will be to decrease the levetiracetam  to 500 mg daily, continue titration of lamotrigine  to 100 mg twice daily, and at that time, around September 15 she should discontinue the levetiracetam  and stop by the clinic to obtain lab work. In terms of her memory, I did inform patient that this is likely a stress reaction due to increased stress and losing her job. Once her stress, anxiety is controlled, her memory will improve.  I did advise her during this time, she should take the alprazolam  half a tablet twice daily to control her symptoms.  She voiced understanding.  I will see her as scheduled.  Continue to follow-up with Dr. Alphonsa and return sooner if  worse.   1. Nonintractable epilepsy without status epilepticus, unspecified epilepsy type (HCC)   2. Memory loss   3. Stress reaction     Patient Instructions  Continue with lamotrigine  75 mg twice daily for 1 week then increase to 100 mg twice daily Decrease levetiracetam  to 500 mg daily for 2 weeks then stop the medication.  At that time, please stop by the office after lab work Please continue to follow with Dr. Alphonsa, and return as scheduled or sooner if worse   Per [place] statutes, patients with seizures are not allowed to drive until they have been seizure-free for six months.  Other recommendations include using caution when using heavy equipment or power tools. Avoid working on ladders or at heights. Take showers instead of baths.  Do not swim alone.  Ensure the water  temperature is not too high on the home water  heater. Do not go swimming alone. Do not lock yourself in a room alone (i.e. bathroom). When caring for infants or small children, sit down when holding, feeding, or changing them to minimize risk of injury to the child in the event you have a seizure. Maintain good sleep hygiene. Avoid alcohol .  Also recommend adequate sleep, hydration, good diet and minimize stress.   During the Seizure  - First, ensure adequate ventilation and place patients on the floor on their left side  Loosen clothing around the neck and ensure the airway is patent. If the patient is clenching the teeth, do not force the mouth open with any object as this can cause severe damage - Remove all items from the surrounding that can be hazardous. The patient may be oblivious to what's happening and may not even know what he or she is doing. If the patient is confused and wandering, either gently guide him/her away and block access to outside areas - Reassure the individual and be comforting - Call 911. In most cases, the seizure ends before EMS arrives. However, there are cases when seizures  may last over 3 to 5 minutes. Or the individual may have developed breathing difficulties or severe injuries. If a pregnant patient or a person with diabetes develops a seizure, it is prudent to call an ambulance. - Finally, if the patient does not regain full consciousness, then call EMS. Most patients will remain confused for about 45 to 90 minutes after a seizure, so you must use judgment in calling for help. - Avoid restraints but make sure the patient is in a bed with padded side rails - Place the individual in a lateral position with the neck slightly flexed; this will help the saliva drain from the mouth and prevent the tongue from falling backward -  Remove all nearby furniture and other hazards from the area - Provide verbal assurance as the individual is regaining consciousness - Provide the patient with privacy if possible - Call for help and start treatment as ordered by the caregiver   After the Seizure (Postictal Stage)  After a seizure, most patients experience confusion, fatigue, muscle pain and/or a headache. Thus, one should permit the individual to sleep. For the next few days, reassurance is essential. Being calm and helping reorient the person is also of importance.  Most seizures are painless and end spontaneously. Seizures are not harmful to others but can lead to complications such as stress on the lungs, brain and the heart. Individuals with prior lung problems may develop labored breathing and respiratory distress.    Discussed Patients with epilepsy have a small risk of sudden unexpected death, a condition referred to as sudden unexpected death in epilepsy (SUDEP). SUDEP is defined specifically as the sudden, unexpected, witnessed or unwitnessed, nontraumatic and nondrowning death in patients with epilepsy with or without evidence for a seizure, and excluding documented status epilepticus, in which post mortem examination does not reveal a structural or toxicologic cause for  death     No orders of the defined types were placed in this encounter.   No orders of the defined types were placed in this encounter.   No follow-ups on file.  I have spent a total of 50 minutes dedicated to this patient today, preparing to see patient, performing a medically appropriate examination and evaluation, ordering tests and/or medications and procedures, and counseling and educating the patient/family/caregiver; independently interpreting result and communicating results to the family/patient/caregiver; and documenting clinical information in the electronic medical record.   Pastor Falling, MD 10/23/2023, 10:25 PM  [facility] [address] [place], [place] [address] [phone]

## 2023-10-23 NOTE — Patient Instructions (Signed)
 Continue with lamotrigine  75 mg twice daily for 1 week then increase to 100 mg twice daily Decrease levetiracetam  to 500 mg daily for 2 weeks then stop the medication.  At that time, please stop by the office after lab work Please continue to follow with Dr. Alphonsa, and return as scheduled or sooner if worse

## 2023-10-25 ENCOUNTER — Ambulatory Visit (INDEPENDENT_AMBULATORY_CARE_PROVIDER_SITE_OTHER): Admitting: Family Medicine

## 2023-10-25 ENCOUNTER — Encounter: Payer: Self-pay | Admitting: Family Medicine

## 2023-10-25 VITALS — BP 159/115 | HR 89 | Temp 98.2°F | Ht 69.0 in | Wt 251.0 lb

## 2023-10-25 DIAGNOSIS — F322 Major depressive disorder, single episode, severe without psychotic features: Secondary | ICD-10-CM | POA: Diagnosis not present

## 2023-10-25 DIAGNOSIS — R569 Unspecified convulsions: Secondary | ICD-10-CM | POA: Diagnosis not present

## 2023-10-25 DIAGNOSIS — F411 Generalized anxiety disorder: Secondary | ICD-10-CM

## 2023-10-25 MED ORDER — ALPRAZOLAM 0.5 MG PO TABS
ORAL_TABLET | ORAL | 2 refills | Status: DC
Start: 2023-10-25 — End: 2024-01-06

## 2023-10-25 NOTE — Progress Notes (Addendum)
 Subjective:    Patient ID: Carol Sharp, female    DOB: 10/29/1975, 48 y.o.   MRN: [id number]  HPI Very nice patient Recently had onset of seizure Spent a very difficult issue for the patient Patient denies any chest tightness pressure pain or shortness of breath Finds himself feeling anxious nervous crying spells difficult time settling her mind difficult time resting no seizures taking her medicines as directed She admits that she is now taking Xanax  anywhere from 1/2 tablet twice daily to sometimes a full tablet with an additional half later on She denies abusing it Unfortunately she recently lost her job She is fearful that she is gena have financial issues and cannot afford her insurance and cannot afford her medicines Her husband is on her insurance but he also has Medicare Follow up for neurological symptoms    10/25/2023   10:51 AM 10/10/2023   10:02 AM 04/04/2023   11:16 AM 01/02/2023    9:35 AM 12/06/2022    1:54 PM  Depression screen PHQ 2/9  Decreased Interest 3 2 2 2 3   Down, Depressed, Hopeless 3 1 3 2 3   PHQ - 2 Score 6 3 5 4 6   Altered sleeping 3 3 3  0 3  Tired, decreased energy 3 3 3 2 2   Change in appetite 3 2 3 2 3   Feeling bad or failure about yourself  3 1 3 2 3   Trouble concentrating 3 2 3 3 3   Moving slowly or fidgety/restless 3 3 1 3 3   Suicidal thoughts 1 0 0 0 0  PHQ-9 Score 25 17 21 16 23   Difficult doing work/chores Somewhat difficult Somewhat difficult Extremely dIfficult Very difficult Somewhat difficult    Review of Systems     Objective:   Physical Exam Lungs clear heart regular pulse normal patient very tearful crying difficult time in the initial part of the visit but toward the end of the visit she was more gathered and able to relate appropriately she denies being suicidal       Assessment & Plan:  1. Depression, major, single episode, severe (HCC) (Primary) Antidepressant-on duloxetine .  She needs to continue this. I recommend  that she go to the walk-in clinic for mental health at [facility] [place]  [facility] services Patient suicidal She did agree that if she starts feeling suicidal or out-of-control she will reach out to us  go to [facility] or go to the ER   2. GAD (generalized anxiety disorder) She is having panic attacks and anxiety it is reasonable at this moment to do Xanax  0.5 mg 3 times daily but because she is having some difficulty gathering her thoughts I have incorporated her family specifically her daughter to set forth 1 days worth of Xanax  at a time and that way her husband will only give her 1 days worth at a time 0.5 g tablet 1 in the morning 1 midday 1 in the evening Counseling and potential additional medicines would be beneficial  3. Seizure [facility]) Specialist saw her Felt that the amnesia aspect she was having was stress related He will be seeing her back in the future but she will follow-up with us  in a few weeks regarding the anxiety depression and anxiety medication  4. Morbid obesity (HCC) Portion control regular physical activity  We will recheck blood pressure on follow-up visit in a couple weeks patient was very upset when she came in she will do follow-up blood work and urine before her follow-up visit  Referral social worker

## 2023-10-26 ENCOUNTER — Other Ambulatory Visit: Payer: Self-pay | Admitting: Neurology

## 2023-11-04 ENCOUNTER — Telehealth: Payer: Self-pay

## 2023-11-04 ENCOUNTER — Other Ambulatory Visit: Payer: Self-pay

## 2023-11-04 DIAGNOSIS — E871 Hypo-osmolality and hyponatremia: Secondary | ICD-10-CM | POA: Diagnosis not present

## 2023-11-04 DIAGNOSIS — F322 Major depressive disorder, single episode, severe without psychotic features: Secondary | ICD-10-CM

## 2023-11-04 NOTE — Telephone Encounter (Signed)
 Referral ot VBCI placed as indicated per pprovider

## 2023-11-05 ENCOUNTER — Other Ambulatory Visit: Payer: Self-pay | Admitting: Licensed Clinical Social Worker

## 2023-11-05 LAB — MICROALBUMIN / CREATININE URINE RATIO
Creatinine, Urine: 115.6 mg/dL
Microalb/Creat Ratio: 12 mg/g{creat} (ref 0–29)
Microalbumin, Urine: 14.2 ug/mL

## 2023-11-05 LAB — BASIC METABOLIC PANEL WITH GFR
BUN/Creatinine Ratio: 11 (ref 9–23)
BUN: 11 mg/dL (ref 6–24)
CO2: 22 mmol/L (ref 20–29)
Calcium: 9.8 mg/dL (ref 8.7–10.2)
Chloride: 90 mmol/L — ABNORMAL LOW (ref 96–106)
Creatinine, Ser: 0.98 mg/dL (ref 0.57–1.00)
Glucose: 88 mg/dL (ref 70–99)
Potassium: 3.7 mmol/L (ref 3.5–5.2)
Sodium: 126 mmol/L — ABNORMAL LOW (ref 134–144)
eGFR: 71 mL/min/1.73 (ref >59)

## 2023-11-07 ENCOUNTER — Other Ambulatory Visit: Payer: Self-pay | Admitting: Licensed Clinical Social Worker

## 2023-11-07 ENCOUNTER — Ambulatory Visit: Payer: Self-pay | Admitting: Family Medicine

## 2023-11-07 NOTE — Patient Instructions (Signed)
 Carol Sharp - I am sorry I was unable to reach you today. I work with Alphonsa Glendia LABOR, MD and am calling to support your healthcare needs. Please contact me at [phone] at your earliest convenience. I look forward to speaking with you soon.   Thank you,  Lyle Rung, BSW, MSW, LCSW Licensed Clinical Social Worker [employer]   [email or web address] Direct Dial: [phone]

## 2023-11-08 ENCOUNTER — Telehealth: Payer: Self-pay | Admitting: Orthopedic Surgery

## 2023-11-08 ENCOUNTER — Other Ambulatory Visit: Payer: Self-pay | Admitting: Orthopedic Surgery

## 2023-11-08 ENCOUNTER — Ambulatory Visit: Admitting: Orthopedic Surgery

## 2023-11-08 DIAGNOSIS — G8929 Other chronic pain: Secondary | ICD-10-CM

## 2023-11-08 DIAGNOSIS — M1711 Unilateral primary osteoarthritis, right knee: Secondary | ICD-10-CM

## 2023-11-08 DIAGNOSIS — M25561 Pain in right knee: Secondary | ICD-10-CM

## 2023-11-08 MED ORDER — HYDROCODONE-ACETAMINOPHEN 5-325 MG PO TABS: 1.0000 | ORAL_TABLET | Freq: Four times a day (QID) | ORAL | 0 refills | Status: DC | PRN

## 2023-11-08 MED ORDER — METHYLPREDNISOLONE ACETATE 40 MG/ML IJ SUSP
40.0000 mg | Freq: Once | INTRAMUSCULAR | Status: AC
Start: 2023-11-08 — End: 2023-11-08
  Administered 2023-11-08: 40 mg via INTRA_ARTICULAR

## 2023-11-08 NOTE — Telephone Encounter (Signed)
 Dr. Areatha pt - spoke w/[facility], they stated the quantity for Hydrocodone  came over as 26.25, needs to be corrected.  [phone]

## 2023-11-08 NOTE — Patient Outreach (Signed)
 Complex Care Management   Visit Note  11/07/2023  Name:  Carol Sharp MRN: [id number] DOB: 08/07/1975  Situation: Referral received for Complex Care Management related to Mental/Behavioral Health diagnosis GAD/Depression. I obtained verbal consent from Patient.  Visit completed with Patient  on the phone  Background:   Past Medical History:  Diagnosis Date   Anxiety    Arthritis    Breast nodule 08/02/2015   Depression    Hypertension    MRSA cellulitis 06/2003   PONV (postoperative nausea and vomiting)     Assessment: Patient Reported Symptoms:  Cognitive Cognitive Status: Able to follow simple commands, Alert and oriented to person, place, and time Cognitive/Intellectual Conditions Management [RPT]: None reported or documented in medical history or problem list   Health Maintenance Behaviors: Annual physical exam, Stress management Healing Pattern: Average Health Facilitated by: Rest, Stress management  Neurological Neurological Review of Symptoms: No symptoms reported Neurological Management Strategies: Coping strategies, Adequate rest Neurological Self-Management Outcome: 4 (good)  Psychosocial Psychosocial Symptoms Reported: Sadness - if selected complete PHQ 2-9, Other Other Psychosocial Conditions: Recent job loss after 20 years of employment Behavioral Management Strategies: Coping strategies, Medication therapy Behavioral Health Self-Management Outcome: 2 (bad) Major Change/Loss/Stressor/Fears (CP): School or job Techniques to [employer] with Loss/Stress/Change: Diversional activities, Medication Quality of Family Relationships: supportive, involved, helpful Do you feel physically threatened by others?: No    11/08/2023    PHQ2-9 Depression Screening   Little interest or pleasure in doing things Nearly every day  Feeling down, depressed, or hopeless Nearly every day  PHQ-2 - Total Score 6  Trouble falling or staying asleep, or sleeping too much Nearly every day   Feeling tired or having little energy Nearly every day  Poor appetite or overeating  Nearly every day  Feeling bad about yourself - or that you are a failure or have let yourself or your family down Nearly every day  Trouble concentrating on things, such as reading the newspaper or watching television More than half the days  Moving or speaking so slowly that other people could have noticed.  Or the opposite - being so fidgety or restless that you have been moving around a lot more than usual Not at all  Thoughts that you would be better off dead, or hurting yourself in some way Not at all  PHQ2-9 Total Score 20  If you checked off any problems, how difficult have these problems made it for you to do your work, take care of things at home, or get along with other people Somewhat difficult  Depression Interventions/Treatment [facility] Provided, Medication (Pt is on medication and will consider BH referral over the next 30 days)    There were no vitals filed for this visit.  Medications Reviewed Today     Reviewed by Merlynn Lyle CROME, LCSW (Social Worker) on 11/07/23 at 1526  Med List Status: <None>   Medication Order Taking? Sig Documenting Provider Last Dose Status Informant  ALPRAZolam  (XANAX ) 0.5 MG tablet [id number]  0.5 tid for anxiety prn Alphonsa Glendia LABOR, MD  Active   chlorthalidone  (HYGROTON ) 50 MG tablet [id number]  TAKE ONE-HALF TABLET BY MOUTH EVERY MORNING Luking, Scott A, MD  Active   diclofenac  (VOLTAREN ) 75 MG EC tablet [id number]  Take 75 mg by mouth 2 (two) times daily. [provider]  Active   DULoxetine  (CYMBALTA ) 60 MG capsule [id number]  TAKE ONE CAPSULE BY MOUTH DAILY Hoskins, Carolyn C, NP  Active Self, Pharmacy Records  gabapentin  (  NEURONTIN ) 300 MG capsule [id number]  Take 900 mg by mouth 3 (three) times daily with meals. [provider]  Active Self, Pharmacy Records  lamoTRIgine  (LAMICTAL ) 100 MG tablet [id number]  Take 1 tablet (100 mg total)  by mouth 2 (two) times daily.  Patient not taking: Reported on 10/23/2023   Camara, Amadou, MD  Active   lamoTRIgine  (LAMICTAL ) 25 MG tablet [id number]  Take 1 tablet (25 mg total) by mouth daily for 7 days, THEN 1 tablet (25 mg total) 2 (two) times daily for 7 days, THEN 2 tablets (50 mg total) 2 (two) times daily for 7 days, THEN 3 tablets (75 mg total) 2 (two) times daily for 7 days, THEN 4 tablets (100 mg total) 2 (two) times daily for 7 days. Gregg Lek, MD  Expired 11/05/23 2359   levETIRAcetam  (KEPPRA ) 500 MG tablet [id number]  Take 1 tablet (500 mg total) by mouth 2 (two) times daily. Raenelle Donalda HERO, MD  Active   REPATHA  SURECLICK 140 MG/ML EMMANUEL [id number]  Inject 140 mg into the skin. [provider]  Active   traZODone  (DESYREL ) 50 MG tablet [id number]  Take 50 mg by mouth at bedtime. [provider]  Active   valsartan  (DIOVAN ) 80 MG tablet [id number]  Take 1 tablet (80 mg total) by mouth daily. Alphonsa Glendia LABOR, MD  Active             Recommendation:   PCP Follow-up Continue Current Plan of Care  Follow Up Plan:   Telephone follow-up in 1 month  Lyle Rung, BSW, MSW, LCSW Licensed Clinical Social Worker [employer]   [email or web address] Direct Dial: [phone]

## 2023-11-08 NOTE — Patient Instructions (Addendum)
 Visit Information  Thank you for taking time to visit with me today. Please don't hesitate to contact me if I can be of assistance to you before our next scheduled appointment.  Our next appointment is by telephone on 11/21/23 at 1:15 Please call the care guide team at [phone] if you need to cancel or reschedule your appointment.   Following is a copy of your care plan:   Goals Addressed             This Visit's Progress    VBCI Social Work Care Plan        Current barriers:   Recent loss of job after 20 years of employment Mental Health needs related to symptoms of depression, stress and anxiety. Patient requires Support, Education, Resources, Referrals, Advocacy, and Care Coordination, in order to meet Unmet Mental Health Needs and to find a therapist and psychiatrist. Patient lacks knowledge of local and available community resources and counseling agencies.   Clinical Goal(s): Over the next 60 days, Patient will verbalize understanding of plan for management of Anxiety, Depression, and Stress symptoms and demonstrate a reduction in symptoms. Patient will connect with a provider for ongoing mental health treatment, increase coping skills, healthy habits, self-management skills, and stress reduction. Patient will implement clinical interventions discussed today to decrease symptoms of depression and increase knowledge and/or ability of: coping skills.    Clinical Interventions:  Assessed patient's previous and current treatment, coping skills, support system and barriers to care. Patient provided hx  Verbalization of feelings encouraged, motivational interviewing employed Emotional support provided, positive coping strategies explored. Establishing healthy boundaries emphasized and healthy self-care education provided Patient was educated on available mental health resources within their area that offer counseling and psychiatry. Patient was advised to contact the back of her  insurance card for assistance with benefits as well. Emotional support provided. CBT intervention implemented regarding being mentally fit by combating negative thinking and replacing it with uplifting support, hope and positivity. Patient reports significant worsening insomnia and anxiety impacting their ability to function appropriately and carry out daily task. Assessed social determinant of health barriers Patient receives strong support from her entire family LCSW provided education on relaxation techniques such as meditation, deep breathing, massage, grounding exercises or yoga that can activate the body's relaxation response and ease symptoms of stress and anxiety. LCSW ask that when pt is struggling with difficult emotions and racing thoughts that they start this relaxation response process. LCSW provided extensive education on healthy coping skills for anxiety. SW used active and reflective listening, validated patient's feelings/concerns, and provided emotional support. Patient will work on implementing appropriate self-care habits into their daily routine such as: staying positive, writing a gratitude list, drinking water , staying active around the house, taking their medications as prescribed, combating negative thoughts or emotions and staying connected with their family and friends. Positive reinforcement provided for this decision to work on this. LCSW provided education on healthy sleep hygiene and what that looks like. LCSW encouraged patient to implement a night time routine into their schedule that works best for them and that they are able to maintain. Advised patient to implement deep breathing/grounding/meditation/self-care exercises into their nightly routine to combat racing thoughts at night. LCSW encouraged patient to wake up at the same time each day, make their sleeping environment comfortable, exercise when able, to limit naps and to not eat or drink anything right before bed.  Explained to go to bed at the same time each night and get up  at the same time each morning, including on the weekends. Make sure your bedroom is quiet, dark, relaxing, and at a comfortable temperature. Remove electronic devices, such as TVs, computers, and smart phones, from the bedroom.    Motivational Interviewing employed Depression screen reviewed  PHQ2/ PHQ9 completed or reviewed  Mindfulness or Relaxation training provided Active listening / Reflection utilized  Advance Care and HCPOA education provided Emotional Support Provided Problem Solving /Task Center strategies reviewed Provided psychoeducation for mental health needs  Provided brief CBT  Reviewed mental health medications and discussed importance of compliance:  Quality of sleep assessed & Sleep Hygiene techniques promoted  Participation in counseling encouraged  Verbalization of feelings encouraged  Suicidal Ideation/Homicidal Ideation assessed: Patient denies SI/HI  Review resources, discussed options and provided patient information about  Mental Health Resources Inter-disciplinary care team collaboration (see longitudinal plan of care) Referral placed for VBCI BSW -financial strain due to recent job loss  Patient Goals/Self-Care Activities: Take medications as prescribed   Attend all scheduled provider appointments Call pharmacy for medication refills 3-7 days in advance of running out of medications Perform all self care activities independently  Perform IADL's (shopping, preparing meals, housekeeping, managing finances) independently Call provider office for new concerns or questions Work with the social worker to address care coordination needs and will continue to work with the clinical team to address health care and disease management related needs call 1-800-273-TALK (toll free, 24 hour hotline) If in a crisis, CALL 988 or go to [facility] [address], [place]  [phone]) Utilize healthy coping skills and supportive resources discussed Contact PCP with any questions or concerns Keep 90 percent of counseling appointments Call your insurance provider for more information about your Enhanced Benefits         Please call the Suicide and Crisis Lifeline: 988 call the [place]  National Suicide Prevention Lifeline: (478) 429-2570 or TTY: 3251659720 TTY 816-286-8594) to talk to a trained counselor call 1-800-273-TALK (toll free, 24 hour hotline) go to [facility] [address] [place] [phone]) call the [facility] Crisis Line: [phone] call 911 if you are experiencing a Mental Health or Behavioral Health Crisis or need someone to talk to.  Patient verbalizes understanding of instructions and care plan provided today and agrees to view in MyChart. Active MyChart status and patient understanding of how to access instructions and care plan via MyChart confirmed with patient.     Lyle Rung, BSW, MSW, LCSW Licensed Clinical Social Worker [employer]   [facility] [email or web address] Direct Dial: [phone]

## 2023-11-08 NOTE — Patient Instructions (Signed)
 You have received an injection of steroids into the joint. 15% of patients will have increased pain within the 24 hours postinjection.   This is transient and will go away.   We recommend that you use ice packs on the injection site for 20 minutes every 2 hours and extra strength Tylenol 2 tablets every 8 as needed until the pain resolves.  If you continue to have pain after taking the Tylenol and using the ice please call the office for further instructions.

## 2023-11-08 NOTE — Progress Notes (Signed)
   This is a follow-up visit for [facility] Complaint  Patient presents with   Knee Pain    Right knee pain    She is requesting an injection in her right knee indicating that the gabapentin  and the Voltaren  75 mg twice a day is not controlling her knee pain  Unfortunately she recently lost her job and had a seizure  Encounter Diagnoses  Name Primary?   Chronic pain of right knee    Primary osteoarthritis of right knee Yes    I think it is fine for her to go ahead and have the injection I do not think she should proceed with surgery at her age and weight  She also wanted something for pain We discussed the pluses and minuses of opioid management for knee pain and arthritis  We decided to go with a one-time prescription which will not be refilled  Procedure note right knee injection   verbal consent was obtained to inject right knee joint  Timeout was completed to confirm the site of injection  The medications used were depomedrol 40 mg and 1% lidocaine  3 cc Anesthesia was provided by ethyl chloride and the skin was prepped with alcohol .  After cleaning the skin with alcohol  a 20-gauge needle was used to inject the right knee joint. There were no complications. A sterile bandage was applied.   Meds ordered this encounter  Medications   methylPREDNISolone  acetate (DEPO-MEDROL ) injection 40 mg   HYDROcodone -acetaminophen  (NORCO/VICODIN) 5-325 MG tablet    Sig: Take 1 tablet by mouth every 6 (six) hours as needed for moderate pain (pain score 4-6).    Dispense:  26.25 each    Refill:  0

## 2023-11-08 NOTE — Progress Notes (Signed)
   LMP 02/14/2021   There is no height or weight on file to calculate BMI.  Chief Complaint  Patient presents with   Knee Pain    Right knee pain    No diagnosis found.  What pharmacy do you use ? [place] [facility] [facility]__________  DOI/DOS/ Date: hurts a little extra today  Worse

## 2023-11-13 ENCOUNTER — Telehealth

## 2023-11-13 ENCOUNTER — Ambulatory Visit (INDEPENDENT_AMBULATORY_CARE_PROVIDER_SITE_OTHER): Admitting: Family Medicine

## 2023-11-13 VITALS — BP 131/90 | HR 81 | Ht 69.0 in | Wt 249.0 lb

## 2023-11-13 DIAGNOSIS — I10 Essential (primary) hypertension: Secondary | ICD-10-CM

## 2023-11-13 DIAGNOSIS — F411 Generalized anxiety disorder: Secondary | ICD-10-CM | POA: Diagnosis not present

## 2023-11-13 DIAGNOSIS — F324 Major depressive disorder, single episode, in partial remission: Secondary | ICD-10-CM | POA: Diagnosis not present

## 2023-11-13 DIAGNOSIS — R569 Unspecified convulsions: Secondary | ICD-10-CM

## 2023-11-13 MED ORDER — VALSARTAN 160 MG PO TABS: 160.0000 mg | ORAL_TABLET | Freq: Every day | ORAL | 1 refills | Status: DC

## 2023-11-13 NOTE — Patient Instructions (Signed)
 Your blood pressure was more elevated than what we would like to see Best reading 138/96  Therefore we will increase valsartan  to 160 mg each day Also please try to taper down on the alprazolam  over the course of the next couple months In 3 weeks reduce the alprazolam  to 1 in the morning half a tablet midday 1 in the evening 3 to 4 weeks later please go to a half in the morning half in the midday and 1 in the evening  You have a standard follow-up visit later in November please keep this  Finally please repeat your blood test to look at your kidney function and potassium in 10 to 14 days  Should you have any questions let us  know thanks-Dr. Glendia

## 2023-11-13 NOTE — Progress Notes (Addendum)
   Subjective:    Patient ID: Carol Sharp, female    DOB: [birth year], 48 y.o.   MRN: [id number] Patient was seen today all documentation was per my direction and dictation Glendia Fielding MD [facility]  HPI Medication follow up  Brought in meds  We reviewed over the medications She is trying to stay healthy with her eating habits Able to do some walking Denies any seizures States her anxiety under better control States depression is doing better. Tolerating medicine well Will start counseling coming up She states he was able to get some social service help  Review of Systems     Objective:   Physical Exam  General-in no acute distress Eyes-no discharge Lungs-respiratory rate normal, CTA CV-no murmurs,RRR Extremities skin warm dry no edema Neuro grossly normal Behavior normal, alert       Assessment & Plan:  1. Primary hypertension (Primary) As for the blood pressure we will increase valsartan  to 160 mg.  She will follow-up later this fall for recheck lab work in approximately 10 days - Basic Metabolic Panel  2. Depression, major, single episode, in partial remission Continue antidepressant start counseling that will be helpful  3. GAD (generalized anxiety disorder) Continue antidepressant continue alprazolam  After the next 3 to 4 weeks I have encouraged her to gradually cut back on alprazolam  and then a month later cut back even more so that she will finally be on 1/2 tablet in the morning half tablet midday and 1 tablet in the evening follow-up later this fall as planned  4. Seizure (HCC) No seizure activity she has appointment with neurology in the spring she will continue her medicines

## 2023-11-14 ENCOUNTER — Other Ambulatory Visit: Payer: Self-pay

## 2023-11-14 NOTE — Patient Outreach (Signed)
 Complex Care Management   Visit Note  11/14/2023  Name:  ARDRA KUZNICKI MRN: [id number] DOB: [birth year]  Situation: Referral received for Complex Care Management related to SDOH Barriers:  Financial Resource Strain I obtained verbal consent from Patient.  Visit completed with Patient  on the phone  Background:   Past Medical History:  Diagnosis Date   Anxiety    Arthritis    Breast nodule 08/02/2015   Depression    Hypertension    MRSA cellulitis 06/2003   PONV (postoperative nausea and vomiting)     Assessment:  Patient reports loss of job 10/23/23 and only income is husband SSI $1800. Patient is working with [employer] to find employment. Patient has been approved for foodstamps. Patient applied for unemployment and was approved but is not sure when a check will arrive. Patient is working with her bank to consolidate bills but is unsure if she has to pay the mortgage for October. Patient has exhausted saving to pay all current bills. Patient has adjusted and eliminated several bills. Patient has been approved for Medicaid. SW does recommend patient follow up with [employer] on Unemployment start date, Bank on October mortgage payment. SW will email a food bank list and Advanced Directive paperwork. Patient declines a follow up.  SDOH Interventions    Flowsheet Row Patient Outreach Telephone from 11/14/2023 in [place] [facility] Patient Outreach from 11/07/2023 in [place] [facility]PARTMENT Office Visit from 10/25/2023 in [facility] Office Visit from 10/19/2022 in [facility] Office Visit from 09/16/2020 in [facility] Office Visit from 08/03/2020 in [facility]  SDOH Interventions        Food Insecurity Interventions Other (Comment), Community Resources Provided  [employer] and email food bank list] -- -- -- -- --  Housing Interventions Other (Comment)  [Working with  bank on consolidation] -- -- -- -- --  Transportation Interventions Patient Resources (Friends/Family), Intervention Not Indicated  [Has a car and family helps. Husband drives] -- -- -- -- --  Utilities Interventions Intervention Not Indicated -- -- -- -- --  Depression Interventions/Treatment  -- [employer] Provided, Medication  [Pt is on medication and will consider BH referral over the next 30 days] Counseling, Medication Medication Medication Medication  Financial Strain Interventions Intervention Not Indicated  [Recently lost job and used savings to current month bills, await unemployment and consolidation] -- -- -- -- --  Stress Interventions -- [employer] Provided, Provide Counseling -- -- -- --      Recommendation:   None  Follow Up Plan:   Patient has met all care management goals. Care Management case will be closed. Patient has been provided contact information should new needs arise.   Tillman Gardener, BSW [place]  [facility], [facility] Social Worker Direct Dial: [phone]  Fax: [phone] Website: [email or web address]

## 2023-11-14 NOTE — Patient Instructions (Signed)
 Visit Information  Thank you for taking time to visit with me today. Please don't hesitate to contact me if I can be of assistance to you before our next scheduled appointment.   Following is a copy of your care plan:   Goals Addressed             This Visit's Progress    COMPLETED: BSW VBCI Social Work Care Plan       Problems:   Financial Strain   CSW Clinical Goal(s):   Over the next 7 days the Patient will will follow up with Bank, [employer], food banks as directed by [employer].  Interventions:  Social Determinants of Health in Patient with HTN: SDOH assessments completed: Financial Strain  Evaluation of current treatment plan related to unmet needs Patient reports loss of job 10/23/23 and only income is husband SSI $1800.  Patient is working with [employer] to find employment.  Patient has been approved for foodstamps.  Patient applied for unemployment and was approved but is not sure when a check will arrive.  Patient is working with her bank to consolidate bills but is unsure if she has to pay the mortgage for October.  Patient has exhausted saving to pay all current bills.  Patient has adjusted and eliminated several bills.  Patient has been approved for Medicaid.  SW does recommend patient follow up with [place] on Unemployment start date, Bank on October mortgage payment.  SW will email a food bank list and Advanced Directive paperwork.  Patient Goals/Self-Care Activities:  Patient will follow up with food banks, complete Advanced Directives, contact bank and [place] [employer].  Plan:   No further follow up required: Patient declines follow up.        Please call 911 if you are experiencing a Mental Health or Behavioral Health Crisis or need someone to talk to.  Patient verbalizes understanding of instructions and care plan provided today and agrees to view in MyChart. Active MyChart status and patient understanding of how to access instructions and care plan via MyChart  confirmed with patient.     Tillman Gardener, BSW [place]  [facility] Social Worker Direct Dial: [phone]  Fax: [phone] Website: [email or web address]

## 2023-11-21 ENCOUNTER — Other Ambulatory Visit: Payer: Self-pay | Admitting: Licensed Clinical Social Worker

## 2023-11-21 DIAGNOSIS — G4700 Insomnia, unspecified: Secondary | ICD-10-CM

## 2023-11-21 DIAGNOSIS — F32A Depression, unspecified: Secondary | ICD-10-CM

## 2023-11-21 DIAGNOSIS — R569 Unspecified convulsions: Secondary | ICD-10-CM

## 2023-11-21 NOTE — Patient Instructions (Signed)
 Visit Information  Thank you for taking time to visit with me today. Please don't hesitate to contact me if I can be of assistance to you before our next scheduled appointment.  Your next care management appointment is by telephone on 12/19/23 at 1:15 pm  Please call the care guide team at [phone] if you need to cancel, schedule, or reschedule an appointment.   Please call the Suicide and Crisis Lifeline: 988 call the [place]  National Suicide Prevention Lifeline: 928-632-8505 or TTY: 2520196208 TTY (858) 093-1602) to talk to a trained counselor call 1-800-273-TALK (toll free, 24 hour hotline) go to [facility] [address] [place] [phone]) call the [facility] Crisis Line: [phone] call 911 if you are experiencing a Mental Health or Behavioral Health Crisis or need someone to talk to.  Lyle Rung, BSW, MSW, LCSW Licensed Clinical Social Worker [employer]   [email or web address] Direct Dial: [phone]

## 2023-11-21 NOTE — Patient Outreach (Signed)
 Complex Care Management   Visit Note  11/21/2023  Name:  Carol Sharp MRN: [id number] DOB: [birth year]  Situation: Referral received for Complex Care Management related to Mental/Behavioral Health diagnosis anxiety. I obtained verbal consent from Patient.  Visit completed with Patient  on the phone  Background:   Past Medical History:  Diagnosis Date   Anxiety    Arthritis    Breast nodule 08/02/2015   Depression    Hypertension    MRSA cellulitis 06/2003   PONV (postoperative nausea and vomiting)     Assessment: Patient Reported Symptoms:  Cognitive Cognitive Status: Able to follow simple commands, Alert and oriented to person, place, and time Cognitive/Intellectual Conditions Management [RPT]: None reported or documented in medical history or problem list   Health Maintenance Behaviors: Annual physical exam, Stress management Healing Pattern: Average Health Facilitated by: Rest, Stress management  Neurological Neurological Review of Symptoms: No symptoms reported Neurological Management Strategies: Coping strategies, Medication therapy, Routine screening, Adequate rest Neurological Self-Management Outcome: 4 (good)  HEENT HEENT Symptoms Reported: No symptoms reported      Cardiovascular Cardiovascular Symptoms Reported: No symptoms reported Does patient have uncontrolled Hypertension?: Yes Is patient checking Blood Pressure at home?: Yes    Respiratory Respiratory Symptoms Reported: No symptoms reported    Endocrine Endocrine Symptoms Reported: No symptoms reported Is patient diabetic?: No    Gastrointestinal Gastrointestinal Symptoms Reported: No symptoms reported      Genitourinary Genitourinary Symptoms Reported: No symptoms reported    Integumentary Integumentary Symptoms Reported: No symptoms reported    Musculoskeletal Musculoskelatal Symptoms Reviewed: No symptoms reported Musculoskeletal Management Strategies: Adequate rest, Medication  therapy Musculoskeletal Self-Management Outcome: 4 (good)      Psychosocial Psychosocial Symptoms Reported: Sadness - if selected complete PHQ 2-9 Behavioral Management Strategies: Coping strategies, Medication therapy Behavioral Health Self-Management Outcome: 4 (good) Behavioral Health Comment: Pt reports improvement in her BH symptom relief Major Change/Loss/Stressor/Fears (CP): School or job Techniques to [employer] with Loss/Stress/Change: Diversional activities, Medication Quality of Family Relationships: supportive, involved, helpful Do you feel physically threatened by others?: No    11/21/2023    PHQ2-9 Depression Screening   Little interest or pleasure in doing things Not at all  Feeling down, depressed, or hopeless Several days  PHQ-2 - Total Score 1  Trouble falling or staying asleep, or sleeping too much More than half the days  Feeling tired or having little energy Not at all  Poor appetite or overeating  Several days  Feeling bad about yourself - or that you are a failure or have let yourself or your family down More than half the days  Trouble concentrating on things, such as reading the newspaper or watching television Not at all  Moving or speaking so slowly that other people could have noticed.  Or the opposite - being so fidgety or restless that you have been moving around a lot more than usual Several days  Thoughts that you would be better off dead, or hurting yourself in some way Not at all  PHQ2-9 Total Score 7  If you checked off any problems, how difficult have these problems made it for you to do your work, take care of things at home, or get along with other people Somewhat difficult  Depression Interventions/Treatment Medication, Counseling, Community Resources Provided (Talk therapy referral placed)    There were no vitals filed for this visit.  Medications Reviewed Today     Reviewed by Merlynn Lyle CROME, LCSW (Social Worker) on 11/21/23 at  1325  Med List  Status: <None>   Medication Order Taking? Sig Documenting Provider Last Dose Status Informant  ALPRAZolam  (XANAX ) 0.5 MG tablet [id number]  0.5 tid for anxiety prn Alphonsa Glendia LABOR, MD  Active   chlorthalidone  (HYGROTON ) 50 MG tablet [id number]  TAKE ONE-HALF TABLET BY MOUTH EVERY MORNING Luking, Scott A, MD  Active   diclofenac  (VOLTAREN ) 75 MG EC tablet [id number]  Take 75 mg by mouth 2 (two) times daily. [provider]  Active   DULoxetine  (CYMBALTA ) 60 MG capsule [id number]  TAKE ONE CAPSULE BY MOUTH DAILY Hoskins, Carolyn C, NP  Active Self, Pharmacy Records  gabapentin  (NEURONTIN ) 300 MG capsule [id number]  Take 900 mg by mouth 3 (three) times daily with meals. [provider]  Active Self, Pharmacy Records  HYDROcodone -acetaminophen  (NORCO/VICODIN) 5-325 MG tablet [id number]  Take 1 tablet by mouth every 6 (six) hours as needed for moderate pain (pain score 4-6). Margrette Taft BRAVO, MD  Active   lamoTRIgine  (LAMICTAL ) 100 MG tablet [id number]  Take 100 mg by mouth daily. [provider]  Active   REPATHA  SURECLICK 140 MG/ML SOAJ [id number]  Inject 140 mg into the skin. [provider]  Active   traZODone  (DESYREL ) 50 MG tablet [id number]  Take 50 mg by mouth at bedtime. [provider]  Active   valsartan  (DIOVAN ) 160 MG tablet [id number]  Take 1 tablet (160 mg total) by mouth daily. Alphonsa Glendia LABOR, MD  Active             Recommendation:   PCP Follow-up Referral to: [facility] talk therapy Continue Current Plan of Care  Follow Up Plan:   Telephone follow-up in 1 month Lyle Rung, BSW, MSW, LCSW Licensed Clinical Social Worker [employer]   [email or web address] Direct Dial: [phone]

## 2023-11-26 ENCOUNTER — Ambulatory Visit: Payer: Self-pay | Admitting: Family Medicine

## 2023-11-26 LAB — BASIC METABOLIC PANEL WITH GFR
BUN/Creatinine Ratio: 20 (ref 9–23)
BUN: 21 mg/dL (ref 6–24)
CO2: 21 mmol/L (ref 20–29)
Calcium: 9.6 mg/dL (ref 8.7–10.2)
Chloride: 97 mmol/L (ref 96–106)
Creatinine, Ser: 1.07 mg/dL — ABNORMAL HIGH (ref 0.57–1.00)
Glucose: 88 mg/dL (ref 70–99)
Potassium: 4.2 mmol/L (ref 3.5–5.2)
Sodium: 136 mmol/L (ref 134–144)
eGFR: 64 mL/min/1.73 (ref >59)

## 2023-11-27 ENCOUNTER — Other Ambulatory Visit: Payer: Self-pay

## 2023-11-27 DIAGNOSIS — M791 Myalgia, unspecified site: Secondary | ICD-10-CM

## 2023-11-27 DIAGNOSIS — M2550 Pain in unspecified joint: Secondary | ICD-10-CM

## 2023-11-27 DIAGNOSIS — R7989 Other specified abnormal findings of blood chemistry: Secondary | ICD-10-CM

## 2023-11-27 NOTE — Telephone Encounter (Signed)
 Nurses Please order CRP, ANA, CK, rheumatoid factor, metabolic 7 Diagnosis arthralgias muscle pain and elevated serum creatinine It is very important for her to do these tests in the middle of the day when she is purposely well-hydrated with water  that we will get the most accurate look at her kidney functions.  Please do these test then we will go from there  Certainly if the ongoing muscle pain and bone pains are getting bad or persisting we would recommend a sooner follow-up office visit  First thing first please do lab work  Thanks-Dr. Glendia

## 2023-11-28 ENCOUNTER — Ambulatory Visit: Admitting: Orthopedic Surgery

## 2023-12-06 ENCOUNTER — Other Ambulatory Visit: Payer: Self-pay | Admitting: Nurse Practitioner

## 2023-12-06 ENCOUNTER — Other Ambulatory Visit: Payer: Self-pay | Admitting: Family Medicine

## 2023-12-19 ENCOUNTER — Encounter: Payer: Self-pay | Admitting: Licensed Clinical Social Worker

## 2023-12-19 ENCOUNTER — Telehealth: Payer: Self-pay | Admitting: Licensed Clinical Social Worker

## 2023-12-19 NOTE — Patient Instructions (Signed)
 Carol Sharp - I am sorry I was unable to reach you today for our scheduled appointment. I work with Alphonsa Glendia LABOR, MD and am calling to support your healthcare needs. Please contact me at [phone] at your earliest convenience. I look forward to speaking with you soon.   Thank you,  Lyle Rung, BSW, MSW, LCSW Licensed Clinical Social Worker [employer]   [facility] [email or web address] Direct Dial: [phone]

## 2024-01-02 ENCOUNTER — Other Ambulatory Visit: Payer: Self-pay | Admitting: Family Medicine

## 2024-01-02 ENCOUNTER — Encounter: Payer: Self-pay | Admitting: Family Medicine

## 2024-01-02 DIAGNOSIS — M79644 Pain in right finger(s): Secondary | ICD-10-CM | POA: Diagnosis not present

## 2024-01-06 ENCOUNTER — Other Ambulatory Visit: Payer: Self-pay | Admitting: Family Medicine

## 2024-01-06 MED ORDER — ALPRAZOLAM 0.5 MG PO TABS: ORAL_TABLET | ORAL | 0 refills | Status: AC

## 2024-01-07 NOTE — Telephone Encounter (Signed)
 Patient scheduled.

## 2024-01-09 ENCOUNTER — Other Ambulatory Visit: Payer: Self-pay

## 2024-01-09 ENCOUNTER — Ambulatory Visit: Payer: Self-pay | Admitting: Family Medicine

## 2024-01-09 MED ORDER — LABETALOL HCL 100 MG PO TABS: 100.0000 mg | ORAL_TABLET | Freq: Two times a day (BID) | ORAL | 2 refills | Status: AC

## 2024-01-10 ENCOUNTER — Other Ambulatory Visit: Payer: Self-pay | Admitting: Licensed Clinical Social Worker

## 2024-01-10 LAB — ANA

## 2024-01-11 LAB — ANA: ANA Titer 1: POSITIVE — AB

## 2024-01-11 LAB — BASIC METABOLIC PANEL WITH GFR
BUN/Creatinine Ratio: 20 (ref 9–23)
BUN: 32 mg/dL — ABNORMAL HIGH (ref 6–24)
CO2: 21 mmol/L (ref 20–29)
Calcium: 9.9 mg/dL (ref 8.7–10.2)
Chloride: 100 mmol/L (ref 96–106)
Creatinine, Ser: 1.58 mg/dL — ABNORMAL HIGH (ref 0.57–1.00)
Glucose: 87 mg/dL (ref 70–99)
Potassium: 4.8 mmol/L (ref 3.5–5.2)
Sodium: 137 mmol/L (ref 134–144)
eGFR: 40 mL/min/1.73 — ABNORMAL LOW (ref >59)

## 2024-01-11 LAB — ENA+DNA/DS+ANTICH+CENTRO+FA...
Anti JO-1: <0.2 AI (ref 0.0–0.9)
Centromere Ab Screen: <0.2 AI (ref 0.0–0.9)
Chromatin Ab SerPl-aCnc: <0.2 AI (ref 0.0–0.9)
ENA RNP Ab: 0.2 AI (ref 0.0–0.9)
ENA SM Ab Ser-aCnc: <0.2 AI (ref 0.0–0.9)
ENA SSA (RO) Ab: <0.2 AI (ref 0.0–0.9)
ENA SSB (LA) Ab: <0.2 AI (ref 0.0–0.9)
Scleroderma (Scl-70) (ENA) Antibody, IgG: <0.2 AI (ref 0.0–0.9)
Spindle Apparatus Pattern: 1:80 {titer}
dsDNA Ab: <1 [IU]/mL (ref 0–9)

## 2024-01-11 LAB — C-REACTIVE PROTEIN: CRP: <1 mg/L (ref 0–10)

## 2024-01-11 LAB — CK: Total CK: 138 U/L (ref 32–182)

## 2024-01-11 LAB — RHEUMATOID FACTOR: Rheumatoid fact SerPl-aCnc: <10 [IU]/mL (ref <14.0)

## 2024-01-15 ENCOUNTER — Telehealth: Payer: Self-pay | Admitting: Pharmacy Technician

## 2024-01-15 ENCOUNTER — Ambulatory Visit (INDEPENDENT_AMBULATORY_CARE_PROVIDER_SITE_OTHER): Admitting: Family Medicine

## 2024-01-15 ENCOUNTER — Other Ambulatory Visit (HOSPITAL_COMMUNITY): Payer: Self-pay

## 2024-01-15 ENCOUNTER — Ambulatory Visit: Admitting: Family Medicine

## 2024-01-15 VITALS — BP 138/91 | HR 55 | Temp 98.1°F | Ht 69.0 in | Wt 258.8 lb

## 2024-01-15 DIAGNOSIS — M25561 Pain in right knee: Secondary | ICD-10-CM

## 2024-01-15 DIAGNOSIS — G8929 Other chronic pain: Secondary | ICD-10-CM

## 2024-01-15 DIAGNOSIS — M1711 Unilateral primary osteoarthritis, right knee: Secondary | ICD-10-CM | POA: Diagnosis not present

## 2024-01-15 DIAGNOSIS — I10 Essential (primary) hypertension: Secondary | ICD-10-CM | POA: Diagnosis not present

## 2024-01-15 DIAGNOSIS — R7989 Other specified abnormal findings of blood chemistry: Secondary | ICD-10-CM | POA: Diagnosis not present

## 2024-01-15 MED ORDER — HYDROCODONE-ACETAMINOPHEN 5-325 MG PO TABS: 1.0000 | ORAL_TABLET | Freq: Four times a day (QID) | ORAL | 0 refills | Status: AC | PRN

## 2024-01-15 MED ORDER — ZEPBOUND 2.5 MG/0.5ML ~~LOC~~ SOAJ: 2.5000 mg | SUBCUTANEOUS | 2 refills | Status: AC

## 2024-01-15 NOTE — Progress Notes (Signed)
 Subjective:    Patient ID: Carol Sharp, female    DOB: December 03, 1975, 48 y.o.   MRN: [id number]  HPI Patient is in room 7.  Patient is here for a follow up.   Patient is wanting to discuss weight loss recommendation.   Patient did mentioned she fell yesterday and tried to catch herself from her fall with her left arm/wrist. She is feeling sore because of this matter.  Discussed the use of AI scribe software for clinical note transcription with the patient, who gave verbal consent to proceed.  History of Present Illness   Carol Sharp is a 48 year old female with hypertension and elevated creatinine who presents with concerns about medication side effects and a recent fall at work.  She is experiencing anxiety, particularly in stressful situations at work, despite an overall improvement in stress levels. 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 She managed the situation by walking away and regrouping.  She describes stepping off a mezzanine and catching herself to prevent a more severe fall. She did not seek immediate medical attention as she initially felt fine, but now reports soreness in her arm.   She is currently taking labetalol  for hypertension, which makes her sleepy. She was previously on valsartan  but was switched to labetalol . She also mentions a significant increase in her creatinine levels from 1.07 to 1.58, despite adequate hydration.  She experiences chronic pain in her bones, particularly in her hands and knees, which she attributes to her usual condition rather than the recent fall. She is concerned about managing her pain without diclofenac , as it significantly affects her mobility.  She works on-site and has been doing so for the past month and a half, which she finds less stressful and beneficial for her mental health. She works day shifts  and is usually home by 3 PM. She appreciates the reduced stress on her eyes and the positive work environment.      Review of Systems     Objective:   Physical Exam General-in no acute distress Eyes-no discharge Lungs-respiratory rate normal, CTA CV-no murmurs,RRR Extremities skin warm dry no edema Neuro grossly normal Behavior normal, alert   Assessment and Plan    Essential hypertension with elevated creatinine, possible kidney dysfunction Elevated creatinine possibly due to labetalol  or diclofenac . Labetalol  causes drowsiness. Diclofenac  may impair kidney function. - Discontinued diclofenac . - Continue labetalol  twice daily. - Ordered follow-up blood test next week to assess kidney function. - Consider alternative antihypertensive if kidney function improves.  Right shoulder rotator cuff sprain Soreness present, risk of adhesions if not managed. - Perform shoulder exercises: small to large circles every other day for 5-8 minutes. - Use broomstick for resistance exercises. - Monitor for improvement over two weeks; consider physical therapy if no improvement.  Chronic musculoskeletal pain Chronic pain in hands, knees, and bones, exacerbated by recent fall. Diclofenac  discontinued due to kidney impact. - Prescribed hydrocodone  as needed for pain. - Use Tylenol  500 mg as needed, two tablets every six hours.  Obesity Discussed management and potential use of weight loss medications. Insurance coverage for Wegovy  and Zepbound  uncertain. Zepbound  may reduce appetite. - Submitted prescription for Zepbound  and initiated insurance approval. - Advised dietary modifications: small protein with complex carbohydrates, avoid fried and fast foods. - Monitor for side effects such as vomiting and severe abdominal pain.  Anxiety disorder Intermittent anxiety,  improved with job change. - Continue current management and monitor symptoms.           Assessment & Plan:  1. Primary  hypertension (Primary) On labetalol  If creatinine comes down we may be able to resume her chlorthalidone  or something similar  2. Elevated serum creatinine Repeat this again within about 10 to 14 days  3. Chronic pain of right knee Hydrocodone  for sparing use home use only - HYDROcodone -acetaminophen  (NORCO/VICODIN) 5-325 MG tablet; Take 1 tablet by mouth every 6 (six) hours as needed for moderate pain (pain score 4-6). Home use only  Dispense: 20 tablet; Refill: 0  4. Primary osteoarthritis of right knee Follow through with seeing specialist of ongoing trouble - HYDROcodone -acetaminophen  (NORCO/VICODIN) 5-325 MG tablet; Take 1 tablet by mouth every 6 (six) hours as needed for moderate pain (pain score 4-6). Home use only  Dispense: 20 tablet; Refill: 0  As for her shoulder I recommended stretching exercises if not seeing improvement with that may need further intervention patient to keep us  informed

## 2024-01-15 NOTE — Telephone Encounter (Signed)
 Pharmacy Patient Advocate Encounter   Received notification from CoverMyMeds that prior authorization for Zepbound  2.5mg /0.19ml is required/requested.   Insurance verification completed.   The patient is insured through [facility].   Per test claim: Per test claim, medication is not covered due to plan/benefit exclusion, PA not submitted at this time  Patient also has secondary coverage through [facility]. Medicaid does not cover unless the patient has moderate to severe sleep apnea confirmed by a sleep study.

## 2024-01-20 ENCOUNTER — Other Ambulatory Visit (HOSPITAL_COMMUNITY): Payer: Self-pay

## 2024-01-20 ENCOUNTER — Other Ambulatory Visit: Payer: Self-pay | Admitting: Nurse Practitioner

## 2024-01-20 ENCOUNTER — Telehealth: Payer: Self-pay | Admitting: Pharmacy Technician

## 2024-01-20 NOTE — Telephone Encounter (Signed)
 Please inform patient that this medication unfortunately is not covered under her insurance for weight reduction.  The referral prior approval team submitted the medication and under her insurance plan it is not covered. (May copy and send this message to patient) Thanks-Dr. Glendia

## 2024-01-20 NOTE — Telephone Encounter (Signed)
 Pharmacy Patient Advocate Encounter   Received notification from Onbase that prior authorization for Repatha  SureClick 140MG /ML auto-injectors is required/requested.   Insurance verification completed.   The patient is insured through [facility].   Per test claim: PA required; PA submitted to above mentioned insurance via Latent Key/confirmation #/EOC [id number] Status is pending

## 2024-01-21 ENCOUNTER — Other Ambulatory Visit (HOSPITAL_COMMUNITY): Payer: Self-pay

## 2024-01-21 NOTE — Telephone Encounter (Signed)
 Confirmed with pharmacy that the [facility] is new insurance for her. She has been receiving the medication under a different plan.

## 2024-01-21 NOTE — Telephone Encounter (Signed)
 Pharmacy Patient Advocate Encounter  Received notification from [facility] that Prior Authorization for Repatha  SureClick 140mg /ml has been DENIED.  Full denial letter will be uploaded to the media tab. See denial reason below.   PA #/Case ID/Reference #: [id number]

## 2024-01-22 NOTE — Telephone Encounter (Signed)
 Nurses-please let patient know that her Repatha  under her new insurance is being denied Unfortunately under their criteria and there is not much I can do about this but I would recommend a consultation with the lipid clinic-this is under Dr. Mona (via cardiology [facility]) recommend go ahead with referral to the lipid clinic-there are other medicines that they could potentially try to get approval of or in some cases they are able to get approval of some of the newer medicines.  Patient cannot tolerate statins.  It is indicated to get her cholesterol down because of her underlying health issues.

## 2024-01-23 DIAGNOSIS — G5622 Lesion of ulnar nerve, left upper limb: Secondary | ICD-10-CM | POA: Diagnosis not present

## 2024-01-23 DIAGNOSIS — M654 Radial styloid tenosynovitis [de Quervain]: Secondary | ICD-10-CM | POA: Diagnosis not present

## 2024-01-23 DIAGNOSIS — G5603 Carpal tunnel syndrome, bilateral upper limbs: Secondary | ICD-10-CM | POA: Diagnosis not present

## 2024-01-29 MED ORDER — PREDNISONE 20 MG PO TABS: ORAL_TABLET | ORAL | 0 refills | Status: AC

## 2024-01-29 NOTE — Addendum Note (Signed)
 Addended by: DELORES LIONEL RAMAN on: 01/29/2024 08:49 AM   Modules accepted: Orders

## 2024-01-29 NOTE — Addendum Note (Signed)
 Addended by: DELORES LIONEL RAMAN on: 01/29/2024 02:44 PM   Modules accepted: Orders

## 2024-01-29 NOTE — Telephone Encounter (Signed)
 Prednisone  20 mg, take 3 tablets once daily for 2 days then take 2 tablets once daily for 2 days then take 1 tablet once daily for 2 days, #12, no refills

## 2024-01-29 NOTE — Telephone Encounter (Signed)
Prescription sent electronically to pharmacy. Patient notified. 

## 2024-01-29 NOTE — Telephone Encounter (Signed)
 Patient notified and is ok with referral. Referral ordered in EPIC. Patient wants to know if sh can have a prescription of prednisone  for a flare of knee pain

## 2024-01-31 ENCOUNTER — Other Ambulatory Visit: Payer: Self-pay | Admitting: Family Medicine

## 2024-01-31 ENCOUNTER — Other Ambulatory Visit: Payer: Self-pay | Admitting: Nurse Practitioner

## 2024-02-03 ENCOUNTER — Ambulatory Visit: Admitting: Orthopedic Surgery

## 2024-02-03 ENCOUNTER — Encounter: Payer: Self-pay | Admitting: Orthopedic Surgery

## 2024-02-03 ENCOUNTER — Other Ambulatory Visit: Payer: Self-pay

## 2024-02-03 VITALS — Ht 69.5 in | Wt 255.0 lb

## 2024-02-03 DIAGNOSIS — G8929 Other chronic pain: Secondary | ICD-10-CM

## 2024-02-03 DIAGNOSIS — M1711 Unilateral primary osteoarthritis, right knee: Secondary | ICD-10-CM

## 2024-02-03 MED ORDER — SULINDAC 150 MG PO TABS: 150.0000 mg | ORAL_TABLET | Freq: Two times a day (BID) | ORAL | Status: DC

## 2024-02-03 MED ORDER — METHYLPREDNISOLONE ACETATE 40 MG/ML IJ SUSP
40.0000 mg | Freq: Once | INTRAMUSCULAR | Status: AC
  Administered 2024-02-03: 12:00:00 40 mg via INTRA_ARTICULAR

## 2024-02-03 MED ORDER — CELECOXIB 100 MG PO CAPS: 100.0000 mg | ORAL_CAPSULE | Freq: Two times a day (BID) | ORAL | 0 refills | Status: DC

## 2024-02-03 NOTE — Progress Notes (Signed)
° ° °  02/03/2024   Chief Complaint  Patient presents with   Knee Pain    Right     No diagnosis found.  What pharmacy do you use ? ____[facility] _______________________  DOI/DOS/ Date:    Did you get better, worse or no change (Answer below)   Worse

## 2024-02-03 NOTE — Patient Instructions (Signed)
 [facility] [address]  [phone]  We will send referral there, you call next week to schedule, they will call you too.

## 2024-02-03 NOTE — Progress Notes (Signed)
° °  Insurance: [place]  Medicaid   48 year old female BMI of 37 history of right total hip arthroplasty by Dr. Fidel presents with right leg and knee pain and vague symptoms.  She complains of pain in several areas in her leg around her knee  She has pain on the posterolateral aspect of the knee and the lateral hamstrings iliotibial band and also on the medial soft tissue sleeve as well.  Mainly involving the pes tendons and bursa  Palpation of the joint lines reveals very minimal symptoms.  However, when she stands she stands with a flexed knee the knee does not come to full extension.  And knee flexion is approximately 110 degrees  She has several varicose veins  Peripheral edema  She has received several cortisone injections over the years Arthroscopy of the right knee was done in 2019  Operative findings included a posterior horn root tear of the medial meniscus with grade III chondromalacia on the tibial plateau and medial femoral condyle collateral ligaments were intact grade 2 chondral lesion was seen in the lateral femoral condyle as well lateral meniscus is intact in the patella showed grade III chondromalacia we also saw some synovitis.  She had a partial medial meniscectomy.   X-rays were done today   DG Knee AP/LAT W/[place] Right Result Date: 02/03/2024 Report of right knee imaging.  X-rays 4 osteoarthritis.  Patient would like to consider surgery X-ray shows osteoarthritis of the right knee.  She has a bone spur or osteophyte on the lateral femoral condyle lateral tibial plateau medial tibial plateau with lateral joint space narrowing She also has osteoarthritis of the patellofemoral joint with superior and inferior patellar osteophytes as well as corresponding femoral osteophyte We also note patellar subluxation laterally without tilt Impression grade 3 arthritis right knee     Assessment and plan 48 year old female with chronic knee pain osteoarthritis worsening  x-rays and worsening pain  Her age and weight make the decision to new total knee arthroplasty difficult but in my opinion we should not proceed until we have lost some weight tried some anti-inflammatory medications and some exercises for the knee   We did inject the knee she is not a candidate for hyaluronic acid secondary to the Medicaid insurance   Meds ordered this encounter  Medications   methylPREDNISolone  acetate (DEPO-MEDROL ) injection 40 mg   DISCONTD: sulindac  (CLINORIL ) tablet 150 mg   celecoxib  (CELEBREX ) 100 MG capsule    Sig: Take 1 capsule (100 mg total) by mouth 2 (two) times daily.    Dispense:  60 capsule    Refill:  0    Procedure note right knee injection   verbal consent was obtained to inject right knee joint  Timeout was completed to confirm the site of injection  The medications used were depomedrol 40 mg and 1% lidocaine  3 cc Anesthesia was provided by ethyl chloride and the skin was prepped with alcohol .  After cleaning the skin with alcohol  a 20-gauge needle was used to inject the right knee joint. There were no complications. A sterile bandage was applied.

## 2024-02-17 ENCOUNTER — Encounter: Payer: Self-pay | Admitting: Family Medicine

## 2024-02-18 ENCOUNTER — Other Ambulatory Visit: Payer: Self-pay | Admitting: Family Medicine

## 2024-02-18 MED ORDER — OSELTAMIVIR PHOSPHATE 75 MG PO CAPS: 75.0000 mg | ORAL_CAPSULE | Freq: Two times a day (BID) | ORAL | 0 refills | Status: AC

## 2024-02-18 MED ORDER — ONDANSETRON HCL 8 MG PO TABS: 8.0000 mg | ORAL_TABLET | Freq: Three times a day (TID) | ORAL | 0 refills | Status: AC | PRN

## 2024-02-26 ENCOUNTER — Ambulatory Visit: Admitting: Orthopedic Surgery

## 2024-03-10 ENCOUNTER — Other Ambulatory Visit: Payer: Self-pay | Admitting: Orthopedic Surgery

## 2024-03-10 DIAGNOSIS — M1711 Unilateral primary osteoarthritis, right knee: Secondary | ICD-10-CM

## 2024-03-10 DIAGNOSIS — G8929 Other chronic pain: Secondary | ICD-10-CM

## 2024-04-15 ENCOUNTER — Institutional Professional Consult (permissible substitution) (HOSPITAL_BASED_OUTPATIENT_CLINIC_OR_DEPARTMENT_OTHER): Admitting: Internal Medicine

## 2024-05-28 ENCOUNTER — Ambulatory Visit: Admitting: Neurology

## 2024-07-15 ENCOUNTER — Ambulatory Visit: Admitting: Family Medicine
